# Patient Record
Sex: Male | Born: 1960 | Race: White | Hispanic: No | Smoking: Never smoker
Health system: Southern US, Community
[De-identification: ages and names within clinical notes are randomized; demographics above are authoritative.]

## PROBLEM LIST (undated history)

## (undated) DIAGNOSIS — K5790 Diverticulosis of intestine, part unspecified, without perforation or abscess without bleeding: Secondary | ICD-10-CM

## (undated) DIAGNOSIS — E785 Hyperlipidemia, unspecified: Secondary | ICD-10-CM

## (undated) DIAGNOSIS — F419 Anxiety disorder, unspecified: Secondary | ICD-10-CM

## (undated) DIAGNOSIS — I2510 Atherosclerotic heart disease of native coronary artery without angina pectoris: Secondary | ICD-10-CM

## (undated) DIAGNOSIS — I491 Atrial premature depolarization: Secondary | ICD-10-CM

## (undated) DIAGNOSIS — I10 Essential (primary) hypertension: Secondary | ICD-10-CM

## (undated) DIAGNOSIS — I241 Dressler's syndrome: Secondary | ICD-10-CM

## (undated) DIAGNOSIS — J45909 Unspecified asthma, uncomplicated: Secondary | ICD-10-CM

## (undated) DIAGNOSIS — I480 Paroxysmal atrial fibrillation: Secondary | ICD-10-CM

## (undated) DIAGNOSIS — I499 Cardiac arrhythmia, unspecified: Secondary | ICD-10-CM

## (undated) DIAGNOSIS — I219 Acute myocardial infarction, unspecified: Secondary | ICD-10-CM

## (undated) HISTORY — PX: COLONOSCOPY: SHX174

## (undated) HISTORY — PX: ESOPHAGOGASTRODUODENOSCOPY: SHX1529

---

## 2002-10-24 ENCOUNTER — Encounter: Payer: Self-pay | Admitting: Neurosurgery

## 2002-10-24 ENCOUNTER — Encounter: Admission: RE | Admit: 2002-10-24 | Discharge: 2002-10-24 | Payer: Self-pay | Admitting: Neurosurgery

## 2004-04-13 ENCOUNTER — Emergency Department (HOSPITAL_COMMUNITY): Admission: EM | Admit: 2004-04-13 | Discharge: 2004-04-13 | Payer: Self-pay | Admitting: Emergency Medicine

## 2004-11-30 ENCOUNTER — Ambulatory Visit: Payer: Self-pay | Admitting: Internal Medicine

## 2004-12-16 ENCOUNTER — Ambulatory Visit: Payer: Self-pay | Admitting: Internal Medicine

## 2005-03-02 ENCOUNTER — Ambulatory Visit: Payer: Self-pay | Admitting: Internal Medicine

## 2005-03-15 ENCOUNTER — Ambulatory Visit (HOSPITAL_COMMUNITY): Admission: RE | Admit: 2005-03-15 | Discharge: 2005-03-15 | Payer: Self-pay | Admitting: Family Medicine

## 2005-03-30 ENCOUNTER — Ambulatory Visit (HOSPITAL_COMMUNITY): Admission: RE | Admit: 2005-03-30 | Discharge: 2005-03-30 | Payer: Self-pay | Admitting: Family Medicine

## 2005-06-22 ENCOUNTER — Ambulatory Visit (HOSPITAL_COMMUNITY): Admission: RE | Admit: 2005-06-22 | Discharge: 2005-06-22 | Payer: Self-pay | Admitting: Family Medicine

## 2005-06-23 ENCOUNTER — Ambulatory Visit: Payer: Self-pay | Admitting: Internal Medicine

## 2005-06-23 ENCOUNTER — Inpatient Hospital Stay (HOSPITAL_COMMUNITY): Admission: AD | Admit: 2005-06-23 | Discharge: 2005-06-26 | Payer: Self-pay | Admitting: Family Medicine

## 2012-01-12 ENCOUNTER — Emergency Department (HOSPITAL_COMMUNITY)
Admission: EM | Admit: 2012-01-12 | Discharge: 2012-01-12 | Disposition: A | Discharge status: Home / Self Care | Payer: Worker's Compensation | Attending: Emergency Medicine | Admitting: Emergency Medicine

## 2012-01-12 ENCOUNTER — Encounter (HOSPITAL_COMMUNITY): Payer: Self-pay | Admitting: Emergency Medicine

## 2012-01-12 ENCOUNTER — Emergency Department (HOSPITAL_COMMUNITY): Payer: Worker's Compensation

## 2012-01-12 DIAGNOSIS — Y9289 Other specified places as the place of occurrence of the external cause: Secondary | ICD-10-CM | POA: Insufficient documentation

## 2012-01-12 DIAGNOSIS — J329 Chronic sinusitis, unspecified: Secondary | ICD-10-CM | POA: Insufficient documentation

## 2012-01-12 DIAGNOSIS — W1789XA Other fall from one level to another, initial encounter: Secondary | ICD-10-CM | POA: Insufficient documentation

## 2012-01-12 DIAGNOSIS — J3489 Other specified disorders of nose and nasal sinuses: Secondary | ICD-10-CM | POA: Insufficient documentation

## 2012-01-12 DIAGNOSIS — Z7982 Long term (current) use of aspirin: Secondary | ICD-10-CM | POA: Insufficient documentation

## 2012-01-12 DIAGNOSIS — Z87891 Personal history of nicotine dependence: Secondary | ICD-10-CM | POA: Insufficient documentation

## 2012-01-12 DIAGNOSIS — Z791 Long term (current) use of non-steroidal anti-inflammatories (NSAID): Secondary | ICD-10-CM | POA: Insufficient documentation

## 2012-01-12 DIAGNOSIS — M2390 Unspecified internal derangement of unspecified knee: Secondary | ICD-10-CM | POA: Insufficient documentation

## 2012-01-12 DIAGNOSIS — Y9301 Activity, walking, marching and hiking: Secondary | ICD-10-CM | POA: Insufficient documentation

## 2012-01-12 MED ORDER — OXYCODONE-ACETAMINOPHEN 5-325 MG PO TABS: 1.0000 | ORAL_TABLET | ORAL | Status: AC | PRN

## 2012-01-12 MED ORDER — AZITHROMYCIN 250 MG PO TABS: ORAL_TABLET | ORAL | Status: DC

## 2012-01-12 NOTE — ED Notes (Signed)
Pt slipped and fell and landed on r knee x 2 days ago. Swelling noted. Pt has ambulatory with cane.

## 2012-01-12 NOTE — ED Notes (Signed)
Pt is going to borrow crutches from friend

## 2012-01-12 NOTE — ED Provider Notes (Signed)
History     CSN: [id number]  Arrival date & time 01/12/12  1418   First MD Initiated Contact with Patient 01/12/12 1454      Chief Complaint  Patient presents with  . Knee Pain    (Consider location/radiation/quality/duration/timing/severity/associated sxs/prior treatment) HPI Comments: Patient c/o pain and swelling ot his right knee after a fall onto the knee 2 days prior. Fell directly on the knee while walking in a parking lot.  Pain is worse with movement and pt states he is unable to bear his full weight to the right leg due to pain and reports severe pain when his right leg is full extended or when his heel is resting on the bed or a flat surface.  He denies other injuries, hip or back pain, numbness or weakness of the extremity  Patient is a 51 y.o. male presenting with knee pain. The history is provided by the patient.  Knee Pain This is a new problem. The current episode started in the past 7 days. The problem occurs constantly. The problem has been unchanged. Associated symptoms include arthralgias, congestion and joint swelling. Pertinent negatives include no chills, fever, headaches, neck pain, numbness, rash, urinary symptoms, vertigo or weakness. The symptoms are aggravated by standing, twisting, walking and bending. He has tried ice for the symptoms. The treatment provided mild relief.    History reviewed. No pertinent past medical history.  History reviewed. No pertinent past surgical history.  History reviewed. No pertinent family history.  History  Substance Use Topics  . Smoking status: Former Games developer  . Smokeless tobacco: Not on file  . Alcohol Use: Yes     occ      Review of Systems  Constitutional: Negative for fever and chills.  HENT: Positive for congestion, rhinorrhea and sinus pressure. Negative for neck pain.   Genitourinary: Negative for dysuria and difficulty urinating.  Musculoskeletal: Positive for joint swelling, arthralgias and gait problem.    Skin: Negative for color change, rash and wound.  Neurological: Negative for vertigo, weakness, numbness and headaches.  All other systems reviewed and are negative.    Allergies  Review of patient's allergies indicates no known allergies.  Home Medications   Current Outpatient Rx  Name Route Sig Dispense Refill  . ASPIRIN 325 MG PO TABS Oral Take 325 mg by mouth every 7 (seven) days. Occasional dose taken    . NAPROXEN SODIUM 220 MG PO CAPS Oral Take 440 mg by mouth once as needed. For pain      BP 176/104  Pulse 93  Temp 98.3 F (36.8 C) (Oral)  Resp 17  SpO2 99%  Physical Exam  Nursing note and vitals reviewed. Constitutional: He is oriented to person, place, and time. He appears well-developed and well-nourished. No distress.  HENT:  Head: Normocephalic and atraumatic.  Nose: Mucosal edema present. Right sinus exhibits maxillary sinus tenderness. Left sinus exhibits maxillary sinus tenderness.  Neck: Normal range of motion. Neck supple.  Cardiovascular: Normal rate, regular rhythm, normal heart sounds and intact distal pulses.   Pulmonary/Chest: Effort normal and breath sounds normal. No respiratory distress.  Musculoskeletal: He exhibits edema and tenderness.       Right knee: He exhibits decreased range of motion, swelling and effusion. He exhibits no ecchymosis, no deformity, no laceration and no erythema. tenderness found. Medial joint line tenderness noted. No patellar tendon tenderness noted.       Legs:      ttp of the anteromedial right knee.  Moderate  effusion.  No erythema, bruising or step off deformity.  Dp pulse is brisk, distal sensation intact  Lymphadenopathy:    He has no cervical adenopathy.  Neurological: He is alert and oriented to person, place, and time. He exhibits normal muscle tone. Coordination normal.  Skin: Skin is warm and dry. No erythema.    ED Course  Procedures (including critical care time)  Labs Reviewed - No data to display Dg  Knee Complete 4 Views Right  01/12/2012  *RADIOLOGY REPORT*  Clinical Data: 51 year old male status post fall with swelling and pain.  RIGHT KNEE - COMPLETE 4+ VIEW  Comparison: None.  Findings: Moderate suprapatellar joint effusion.  Patella appears intact.  Relatively preserved joint spaces.  Mild medial and lateral compartment degenerative spurring.  No fracture or dislocation identified.  IMPRESSION: Moderate joint effusion.  No acute fracture or dislocation identified.   Original Report Authenticated By: Odessa Fleming III, M.D.      Knee immobilizer applied, pain improved, remains NV intact. Pt has crutches at home.   MDM     Consulted Dr. Romeo Apple,  Will see pt in his office on Monday 01/15/12 at 3:30 pm.  Likely ligament or meniscal injury.  Will also treat with abx for sinusitis  Rx: Percocet #24 zithromax   Davette Nugent L. [place], [place] 01/13/12 2228
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
## 2012-01-15 ENCOUNTER — Encounter: Payer: Self-pay | Admitting: Orthopedic Surgery

## 2012-01-15 ENCOUNTER — Ambulatory Visit (INDEPENDENT_AMBULATORY_CARE_PROVIDER_SITE_OTHER): Payer: Worker's Compensation | Admitting: Orthopedic Surgery

## 2012-01-15 VITALS — Ht 73.5 in | Wt 260.0 lb

## 2012-01-15 DIAGNOSIS — IMO0002 Reserved for concepts with insufficient information to code with codable children: Secondary | ICD-10-CM

## 2012-01-15 NOTE — Patient Instructions (Addendum)
Apply ice for 30 minutes every 2 hours while awake   Wear brace  Use crutches as needed   No work until MRI reviewed by doctor   You have been scheduled for an MRI scan.  Your insurance company requires a precertification prior to scheduling the MRI.  If the MRI scan is not approved we will let you know and make further treatment recommendations according to your insurance's guidelines.  * We will schedule you for another  appointment to review the results and make further treatment recommendations

## 2012-01-15 NOTE — Progress Notes (Signed)
Subjective:    Patient ID: Chad Ellis, male    DOB: Dec 28, 1960, 51 y.o.   MRN: [id number]  HPI Comments: Timeline: 01/10/2012 this patient fell in [facility] parking lot slipping on a wet ground falling onto a flexed knee experiencing excruciating pain at that time. 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  On 01/12/2012 he went to the [facility] emergency room for evaluation of continued knee pain. His x-ray showed joint effusion no fracture he was placed in a long-leg brace given crutches and Percocet for pain he presents now for evaluation.  Knee Pain  The incident occurred 5 to 7 days ago. Incident location: at hotel. The pain is present in the right knee. The quality of the pain is described as burning and aching. The pain is at a severity of 9/10. The pain has been constant since onset. Associated symptoms include an inability to bear weight and a loss of motion. Pertinent negatives include no loss of sensation, muscle weakness, numbness or tingling. Associated symptoms comments: Catching .      Review of Systems  Neurological: Negative for tingling and numbness.   the patient listed positive review of systems as fatigue, heartburn, joint pain and muscle pain  He listed all the other systems as normal  His medical history is negative he listed no surgeries. His primary care physician is Dr. Clelia Croft [facility]  His pharmacy is [facility] and  He denied taking any chronic medications he says he has a negative family history  He is married he finished a dull high school he does not smoke his alcohol use is occasional. He denied any street drug use       Objective:   Physical Exam  Nursing note and vitals reviewed. Constitutional: He is oriented to person, place, and time. He appears well-developed and well-nourished. No distress.  HENT:  Head: Normocephalic.  Eyes: Pupils are equal, round, and  reactive to light.  Neck: Normal range of motion.  Cardiovascular: Intact distal pulses.   Musculoskeletal:       Right hip: Normal.       Left hip: Normal.       Right knee: He exhibits decreased range of motion, swelling, effusion, bony tenderness and abnormal meniscus. He exhibits no laceration, normal alignment, no LCL laxity, normal patellar mobility and no MCL laxity. tenderness found. Medial joint line tenderness noted. No lateral joint line, no MCL, no LCL and no patellar tendon tenderness noted.       Right lower leg: He exhibits tenderness, bony tenderness, swelling and edema. He exhibits no deformity and no laceration.       Positive Murray sign for medial meniscal tear  Neurological: He is alert and oriented to person, place, and time. He has normal reflexes. He displays normal reflexes. He exhibits normal muscle tone. Coordination normal.  Skin: Skin is warm and dry. No rash noted. He is not diaphoretic. No erythema. No pallor.  Psychiatric: He has a normal mood and affect. His behavior is normal. Judgment and thought content normal.          Assessment & Plan:  X-ray was reviewed. Effusion noted no fracture  Clinical exam is consistent with a torn medial meniscus.  Recommend MRI to assess the joint, surgery if meniscus torn  The patient is placed in a wrap style economy hinged brace full range of motion weight bear as tolerated with crutches, wean crutches  as tolerated  Continue ice  No work until MRI is completed and assessed by physician

## 2012-01-20 NOTE — ED Provider Notes (Signed)
Medical screening examination/treatment/procedure(s) were performed by non-physician practitioner and as supervising physician I was immediately available for consultation/collaboration. Devoria Albe, MD, FACEP   Ward Givens, MD 01/20/12 0900

## 2012-01-23 ENCOUNTER — Telehealth: Payer: Self-pay | Admitting: Orthopedic Surgery

## 2012-01-23 NOTE — Telephone Encounter (Signed)
Per previous note, MRI appointment is scheduled, at [facility], for tomorrow, 01/24/12, 1:00p.m, 12:45 p.m registration; patient is aware, and has confirmed.  Follow up appointment for results has also been scheduled.

## 2012-01-23 NOTE — Telephone Encounter (Signed)
I confirmed with our office that patient is scheduled for the MRI at [facility] for tomorrow, 01/24/12, 1:00, 12:45 registration, (see separate note).  I confirmed with patient.  He is to follow up here for results, appointment scheduled.

## 2012-01-23 NOTE — Telephone Encounter (Signed)
At time of check-out, office visit 01/15/12, I spoke with patient regarding MRI and any insurance available.  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  Patient aware, per our conversation, that this is 3rd party, if it is determined that this party is responsible, and he is therefore considered self-pay; for this reason, patient is holding on the MRI.  * I called back today to follow up with patient. He relayed information about his contact with employer, which he said he is awaiting further word from, which he assumes will help him to know how to proceed. He also said he received a call from [facility]'s/[place]'s pre-authorization center, Rodman Pickle, about scheduling MRI.   I received a call from Bellamy at this center (ph [phone]).  I relayed the information as per office visit, that MRI was on hold for further information / insurance / or self-pay status from patient.

## 2012-01-24 ENCOUNTER — Ambulatory Visit (HOSPITAL_COMMUNITY)
Admission: RE | Admit: 2012-01-24 | Discharge: 2012-01-24 | Disposition: A | Discharge status: Home / Self Care | Payer: Worker's Compensation | Source: Ambulatory Visit | Attending: Orthopedic Surgery | Admitting: Orthopedic Surgery

## 2012-01-24 DIAGNOSIS — S83509A Sprain of unspecified cruciate ligament of unspecified knee, initial encounter: Secondary | ICD-10-CM | POA: Insufficient documentation

## 2012-01-24 DIAGNOSIS — M25569 Pain in unspecified knee: Secondary | ICD-10-CM | POA: Insufficient documentation

## 2012-01-24 DIAGNOSIS — S82209A Unspecified fracture of shaft of unspecified tibia, initial encounter for closed fracture: Secondary | ICD-10-CM | POA: Insufficient documentation

## 2012-01-24 DIAGNOSIS — W19XXXA Unspecified fall, initial encounter: Secondary | ICD-10-CM | POA: Insufficient documentation

## 2012-01-24 DIAGNOSIS — IMO0002 Reserved for concepts with insufficient information to code with codable children: Secondary | ICD-10-CM

## 2012-01-29 ENCOUNTER — Encounter: Payer: Self-pay | Admitting: Orthopedic Surgery

## 2012-01-29 ENCOUNTER — Ambulatory Visit (INDEPENDENT_AMBULATORY_CARE_PROVIDER_SITE_OTHER): Payer: Worker's Compensation | Admitting: Orthopedic Surgery

## 2012-01-29 VITALS — Ht 73.5 in | Wt 260.0 lb

## 2012-01-29 DIAGNOSIS — IMO0002 Reserved for concepts with insufficient information to code with codable children: Secondary | ICD-10-CM

## 2012-01-29 DIAGNOSIS — S99919A Unspecified injury of unspecified ankle, initial encounter: Secondary | ICD-10-CM

## 2012-01-29 DIAGNOSIS — M238X9 Other internal derangements of unspecified knee: Secondary | ICD-10-CM

## 2012-01-29 DIAGNOSIS — M2390 Unspecified internal derangement of unspecified knee: Secondary | ICD-10-CM

## 2012-01-29 DIAGNOSIS — S8990XA Unspecified injury of unspecified lower leg, initial encounter: Secondary | ICD-10-CM

## 2012-01-29 MED ORDER — HYDROCODONE-ACETAMINOPHEN 10-325 MG PO TABS: 1.0000 | ORAL_TABLET | Freq: Four times a day (QID) | ORAL | Status: DC | PRN
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
## 2012-01-29 NOTE — Patient Instructions (Addendum)
Injuries noted on MRI, including a posterior cruciate ligament fracture avulsion and a torn meniscus possibly a torn anterior cruciate ligament  You  will need an exam under anesthesia to determine the stability of her knee followed by an arthroscopic evaluation and treatment of the meniscal tear. You  may need to procedures depending on the results of the 1st procedure  We will call the  insurer and get approval from the insurance company

## 2012-03-27 ENCOUNTER — Telehealth: Payer: Self-pay | Admitting: Orthopedic Surgery

## 2012-03-27 NOTE — Telephone Encounter (Signed)
Call received from patient. She called to request some refills.   Dose confirmed and script sent.

## 2012-03-27 NOTE — Telephone Encounter (Signed)
Patient called today to relay information.  He is continuing out of work status, per last visit here 01/29/12.  He has not been back in touch about scheduling surgery, as he is continuing to pursue responsible party/3rd party insurance information.        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 Patient aware that we made attempts without any calls back, however, also aware that he remains self-pay, and aware that he has had the option to schedule further treatment as self-pay.  He has since made further contact with his employer and after various communication, was referred to employment's workers compensation insurer,  [employer], ph [phone], and has adjustor as contact, Marca Ancona, ext [phone] / fax [phone].  CL# issued [phone].   Patient has been given office workers comp form packet, aware to complete and return, and employers portion has been faxed to this insurer.  Dr Romeo Apple to review - advise if any other recommendations while this information in process.   Patient ph# [phone].

## 2012-03-28 NOTE — Telephone Encounter (Signed)
No advice at present

## 2012-04-11 ENCOUNTER — Telehealth: Payer: Self-pay | Admitting: Orthopedic Surgery
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
## 2012-04-12 NOTE — Telephone Encounter (Addendum)
Medical records faxed to the Workers Comp insurer with authorization release form as noted.  Patient states that Workers Comp will likely send him for a second opinion.    * Patient also asked about possible refill on pain medication from last visit due to pending status of Workers comp review.  I gave him the instructions of requesting that his pharmacy fax to the office, although Dr. Romeo Apple would need to review it.

## 2012-04-19 NOTE — Telephone Encounter (Signed)
04/18/12 Received call back from Dustin Folks.  Re-Faxed copy of records to her w/work status note.  She states now received and will review in order for patient to be contacted as soon as possible.

## 2012-04-22 NOTE — Telephone Encounter (Signed)
RECEIVED CALL BACK FROM Workers Designer, industrial/product at [employer], Maralyn Sago, [place] [phone],Y [phone]. Dr. Romeo Apple - Please advise if patient needs to be seen again before surgery date can be scheduled - Workers Comp states "ready to move forward for surgery" for this patient, per review of his medical records.

## 2012-04-23 NOTE — Telephone Encounter (Signed)
Yes   i dont even know who this is

## 2012-04-24 NOTE — Telephone Encounter (Signed)
04/23/12 Called back to patient.  Appointment scheduled, office visit, pre-op, discuss surgery.  Notified Workers comp Scientist, forensic of appointment, 05/02/12, 10:30a.m.

## 2012-05-02 ENCOUNTER — Ambulatory Visit (INDEPENDENT_AMBULATORY_CARE_PROVIDER_SITE_OTHER): Payer: Worker's Compensation

## 2012-05-02 ENCOUNTER — Ambulatory Visit (INDEPENDENT_AMBULATORY_CARE_PROVIDER_SITE_OTHER): Payer: Worker's Compensation | Admitting: Orthopedic Surgery

## 2012-05-02 ENCOUNTER — Telehealth: Payer: Self-pay | Admitting: Orthopedic Surgery

## 2012-05-02 VITALS — BP 150/98 | Ht 73.5 in | Wt 260.0 lb

## 2012-05-02 DIAGNOSIS — S82201A Unspecified fracture of shaft of right tibia, initial encounter for closed fracture: Secondary | ICD-10-CM

## 2012-05-02 DIAGNOSIS — S82209A Unspecified fracture of shaft of unspecified tibia, initial encounter for closed fracture: Secondary | ICD-10-CM

## 2012-05-02 MED ORDER — HYDROCODONE-ACETAMINOPHEN 5-325 MG PO TABS: 1.0000 | ORAL_TABLET | ORAL | Status: DC | PRN

## 2012-05-02 NOTE — Patient Instructions (Addendum)
Schedule for right knee arthroscopy + medial menisectomy: exam under anesthesia (Workers Comp)  We have to get W/C to approve the surgery    [[id number], [id number]]

## 2012-05-02 NOTE — Telephone Encounter (Signed)
Faxed note of request for written approval,authorization for surgery to attention of Dustin Folks, adjustor, [employer], Workers Comp, with procedure code and diagnosis code information. Fax # [phone], ph # [phone].

## 2012-05-02 NOTE — Progress Notes (Signed)
Patient ID: Chad Ellis, male   DOB: 1960-07-15, 52 y.o.   MRN: [id number] Chief Complaint  Patient presents with  . Follow-up    recheck right knee and discuss surgery    Timeline: 01/10/2012 this patient fell in [facility] parking lot slipping on a wet ground falling onto a flexed knee experiencing excruciating pain at that time. 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  On 01/12/2012 he went to the [facility] emergency room for evaluation of continued knee pain. His x-ray showed joint effusion no fracture he was placed in a long-leg brace given crutches and Percocet for pain he presents now for evaluation.  Knee Pain   The incident occurred 5 to 7 days ago. Incident location: at hotel. The pain is present in the right knee. The quality of the pain is described as burning and aching. The pain is at a severity of 9/10. The pain has been constant since onset. Associated symptoms include an inability to bear weight and a loss of motion. Pertinent negatives include no loss of sensation, muscle weakness, numbness or tingling. Associated symptoms comments: Catching .   Review of systems the patient does become anxious and is worried because of his financial situation which has become very tenuous  It appears the patient is finally gotten workers compensation issue straightened out he took an x-ray of his tibia today shows his tibia fracture has healed.  Clinical exam shows he has stability in the collateral ligaments as well as the anterior cruciate ligament has a grade 1 posterior drawer. He still has painful range of motion medial joint line tenderness  He will need to have arthroscopy of the right knee medial meniscectomy arthroscopic evaluation of the anterior cruciate ligament and PCL and exam under anesthesia to fully evaluate his knee however it appears that his PCL injury and anterior cruciate ligament injuries have  healed  He will  benefit from medial meniscectomy in physical therapy to regain function of his knee  Once he gets the number for [place] to go head with surgery we'll schedule him for the procedure

## 2012-05-02 NOTE — Telephone Encounter (Signed)
Called Dustin Folks to follow up on fax; states received.  Reviewing office note and to forward written notice of approval - states should be by tomorrow, 05/03/12.

## 2012-05-06 NOTE — Telephone Encounter (Signed)
I called back to follow up with Workers Comp today, Mon, 05/06/12; adjusstor does have our notes; states reviewing notes and to then fax back response regarding surgery.

## 2012-05-07 ENCOUNTER — Encounter (HOSPITAL_COMMUNITY): Payer: Self-pay | Admitting: Pharmacy Technician

## 2012-05-07 ENCOUNTER — Other Ambulatory Visit: Payer: Self-pay | Admitting: *Deleted

## 2012-05-08 ENCOUNTER — Encounter (HOSPITAL_COMMUNITY)
Admission: RE | Admit: 2012-05-08 | Discharge: 2012-05-08 | Disposition: A | Discharge status: Home / Self Care | Payer: Worker's Compensation | Source: Ambulatory Visit | Attending: Orthopedic Surgery | Admitting: Orthopedic Surgery

## 2012-05-08 ENCOUNTER — Encounter (HOSPITAL_COMMUNITY): Payer: Self-pay

## 2012-05-08 ENCOUNTER — Other Ambulatory Visit: Payer: Self-pay

## 2012-05-08 HISTORY — DX: Anxiety disorder, unspecified: F41.9

## 2012-05-08 HISTORY — DX: Diverticulosis of intestine, part unspecified, without perforation or abscess without bleeding: K57.90

## 2012-05-08 LAB — HEMOGLOBIN AND HEMATOCRIT, BLOOD: Hemoglobin: 14.9 g/dL (ref 13.0–17.0)

## 2012-05-08 LAB — SURGICAL PCR SCREEN: MRSA, PCR: NEGATIVE

## 2012-05-08 MED ORDER — CHLORHEXIDINE GLUCONATE 4 % EX LIQD: 60.0000 mL | Freq: Once | CUTANEOUS | Status: DC

## 2012-05-08 NOTE — Patient Instructions (Addendum)
Chad Ellis  05/08/2012   Your procedure is scheduled on:  05/10/12  Report to [facility] at 06:15 AM.  Call this number if you have problems the morning of surgery: [phone]   Remember:   Do not eat food or drink liquids after midnight.   Take these medicines the morning of surgery with A SIP OF WATER: Hydrocodone if needed.   Do not wear jewelry, make-up or nail polish.  Do not wear lotions, powders, or perfumes.   Do not shave 48 hours prior to surgery. Men may shave face and neck.  Do not bring valuables to the hospital.  Contacts, dentures or bridgework may not be worn into surgery.  Leave suitcase in the car. After surgery it may be brought to your room.  For patients admitted to the hospital, checkout time is 11:00 AM the day of  discharge.   Patients discharged the day of surgery will not be allowed to drive  home.  Name and phone number of your driver:   Special Instructions: Shower using CHG 2 nights before surgery and the night before surgery.  If you shower the day of surgery use CHG.  Use special wash - you have one bottle of CHG for all showers.  You should use approximately 1/3 of the bottle for each shower.   Please read over the following fact sheets that you were given: Pain Booklet, MRSA Information, Surgical Site Infection Prevention, Anesthesia Post-op Instructions and Care and Recovery After Surgery    Arthroscopic Procedure, Knee An arthroscopic procedure can find what is wrong with your knee. PROCEDURE Arthroscopy is a surgical technique that allows your orthopedic surgeon to diagnose and treat your knee injury with accuracy. They will look into your knee through a small instrument. This is almost like a small (pencil sized) telescope. Because arthroscopy affects your knee less than open knee surgery, you can anticipate a more rapid recovery. Taking an active role by following your caregiver's instructions will help with rapid and complete recovery. Use  crutches, rest, elevation, ice, and knee exercises as instructed. The length of recovery depends on various factors including type of injury, age, physical condition, medical conditions, and your rehabilitation. Your knee is the joint between the large bones (femur and tibia) in your leg. Cartilage covers these bone ends which are smooth and slippery and allow your knee to bend and move smoothly. Two menisci, thick, semi-lunar shaped pads of cartilage which form a rim inside the joint, help absorb shock and stabilize your knee. Ligaments bind the bones together and support your knee joint. Muscles move the joint, help support your knee, and take stress off the joint itself. Because of this all programs and physical therapy to rehabilitate an injured or repaired knee require rebuilding and strengthening your muscles. AFTER THE PROCEDURE  After the procedure, you will be moved to a recovery area until most of the effects of the medication have worn off. Your caregiver will discuss the test results with you.  Only take over-the-counter or prescription medicines for pain, discomfort, or fever as directed by your caregiver. SEEK MEDICAL CARE IF:   You have increased bleeding from your wounds.  You see redness, swelling, or have increasing pain in your wounds.  You have pus coming from your wound.  You have an oral temperature above 102 F (38.9 C).  You notice a bad smell coming from the wound or dressing.  You have severe pain with any motion of your knee. SEEK IMMEDIATE  MEDICAL CARE IF:   You develop a rash.  You have difficulty breathing.  You have any allergic problems. Document Released: 02/25/2000 Document Revised: 05/22/2011 Document Reviewed: 09/18/2007 [facility] Patient Information 2013 [place], [place].    PATIENT INSTRUCTIONS POST-ANESTHESIA  IMMEDIATELY FOLLOWING SURGERY:  Do not drive or operate machinery for the first twenty four hours after surgery.  Do not make any  important decisions for twenty four hours after surgery or while taking narcotic pain medications or sedatives.  If you develop intractable nausea and vomiting or a severe headache please notify your doctor immediately.  FOLLOW-UP:  Please make an appointment with your surgeon as instructed. You do not need to follow up with anesthesia unless specifically instructed to do so.  WOUND CARE INSTRUCTIONS (if applicable):  Keep a dry clean dressing on the anesthesia/puncture wound site if there is drainage.  Once the wound has quit draining you may leave it open to air.  Generally you should leave the bandage intact for twenty four hours unless there is drainage.  If the epidural site drains for more than 36-48 hours please call the anesthesia department.  QUESTIONS?:  Please feel free to call your physician or the hospital operator if you have any questions, and they will be happy to assist you.

## 2012-05-08 NOTE — Telephone Encounter (Signed)
05/07/12*Per Dr. Romeo Apple:  Surgery has been scheduled for Friday 05/10/12, at [facility], per [employer] written authorization received from [employer].  Patient aware of surgery and of post operative appointment.

## 2012-05-09 NOTE — H&P (Signed)
Chad Ellis is an 52 y.o. male.   Subjective:   Patient ID: Chad Ellis, male DOB: 1960/04/04, 52 y.o. MRN: [id number]  HPI Comments: Timeline: 01/10/2012 this patient fell in [facility] parking lot slipping on a wet ground falling onto a flexed knee experiencing excruciating pain at that time. 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  On 01/12/2012 he went to the [facility] emergency room for evaluation of continued knee pain. His x-ray showed joint effusion no fracture he was placed in a long-leg brace given crutches and Percocet for pain he presents now for evaluation.  Knee Pain  The incident occurred 5 to 7 days ago. Incident location: at hotel. The pain is present in the right knee. The quality of the pain is described as burning and aching. The pain is at a severity of 9/10. The pain has been constant since onset. Associated symptoms include an inability to bear weight and a loss of motion. Pertinent negatives include no loss of sensation, muscle weakness, numbness or tingling. Associated symptoms comments: Catching .   Review of Systems  Neurological: Negative for tingling and numbness.  the patient listed positive review of systems as fatigue, heartburn, joint pain and muscle pain  He listed all the other systems as normal  His medical history is negative he listed no surgeries. His primary care physician is Dr. Clelia Croft the [facility]  His pharmacy is [facility]  He denied taking any chronic medications he says he has a negative family history  He is married he finished a dull high school he does not smoke his alcohol use is occasional. He denied any street drug use   Past Medical History  Diagnosis Date  . Anxiety   . Diverticulosis     Past Surgical History  Procedure Laterality Date  . Esophagogastroduodenoscopy    . Colonoscopy      Family History  Problem Relation Age of Onset  . Atrial  fibrillation Mother   . COPD Father   . Mesothelioma Father   . Diabetes Father   . Cholecystitis Sister    Social History:  reports that he has never smoked. He does not have any smokeless tobacco history on file. He reports that  drinks alcohol. He reports that he does not use illicit drugs.  Allergies: No Known Allergies  No prescriptions prior to admission    Results for orders placed during the hospital encounter of 05/08/12 (from the past 48 hour(s))  SURGICAL PCR SCREEN     Status: None   Collection Time    05/08/12  9:02 AM      Result Value Range   MRSA, PCR NEGATIVE  NEGATIVE   Staphylococcus aureus NEGATIVE  NEGATIVE   Comment:            The Xpert SA Assay (FDA     approved for NASAL specimens     in patients over 14 years of age),     is one component of     a comprehensive surveillance     program.  Test performance has     been validated by [employer] for patients greater     than or equal to 64 year old.     It is not intended     to diagnose infection nor to     guide or monitor treatment.  HEMOGLOBIN AND HEMATOCRIT, BLOOD     Status:  None   Collection Time    05/08/12  9:30 AM      Result Value Range   Hemoglobin 14.9  13.0 - 17.0 g/dL   HCT 16.1  09.6 - 04.5 %   No results found.  ROS  There were no vitals taken for this visit. Physical Exam   General appearance is normal, the patient is alert and oriented x3 with normal mood and affect.  Ambulation: he limps on the right leg   Upper extremity exam  The right and left upper extremity:   Inspection and palpation revealed no abnormalities in the upper extremities.   Range of motion is full without contracture.  Motor exam is normal with grade 5 strength.  The joints are fully reduced without subluxation.  There is no atrophy or tremor and muscle tone is normal.  All joints are stable.   Left Lower extremity exam Inspection and palpation revealed no tenderness or abnormality in  alignment in the lower extremities. Range of motion is full.  Strength is grade 5. All joints are stable.  Right lower extremity:   Medial joint line tender Joint effusionis small ROM is limed by pain and is 120 degrees Ligaments PCL grade 1  acl grade ? (wait for EUA) Collaterals stable including dial test  Motor exam 5/5  Sign of McMurray +    Assessment/Plan 1. Medial meniscus is torn 2. pcl avulsion has healed  3. ACL laxity ? Severity   Plan: EUA then sark medial menisectomy    Fuller Canada 05/09/2012, 1:18 PM

## 2012-05-10 ENCOUNTER — Encounter (HOSPITAL_COMMUNITY): Payer: Self-pay | Admitting: *Deleted

## 2012-05-10 ENCOUNTER — Ambulatory Visit (HOSPITAL_COMMUNITY): Payer: Worker's Compensation | Admitting: Anesthesiology

## 2012-05-10 ENCOUNTER — Ambulatory Visit (HOSPITAL_COMMUNITY)
Admission: RE | Admit: 2012-05-10 | Discharge: 2012-05-10 | Disposition: A | Discharge status: Home / Self Care | Payer: Worker's Compensation | Source: Ambulatory Visit | Attending: Orthopedic Surgery | Admitting: Orthopedic Surgery

## 2012-05-10 ENCOUNTER — Encounter (HOSPITAL_COMMUNITY)
Admission: RE | Disposition: A | Discharge status: Home / Self Care | Payer: Self-pay | Source: Ambulatory Visit | Attending: Orthopedic Surgery

## 2012-05-10 ENCOUNTER — Encounter (HOSPITAL_COMMUNITY): Payer: Self-pay | Admitting: Anesthesiology

## 2012-05-10 DIAGNOSIS — I10 Essential (primary) hypertension: Secondary | ICD-10-CM | POA: Insufficient documentation

## 2012-05-10 DIAGNOSIS — M238X1 Other internal derangements of right knee: Secondary | ICD-10-CM

## 2012-05-10 DIAGNOSIS — Y9289 Other specified places as the place of occurrence of the external cause: Secondary | ICD-10-CM | POA: Insufficient documentation

## 2012-05-10 DIAGNOSIS — S8991XD Unspecified injury of right lower leg, subsequent encounter: Secondary | ICD-10-CM

## 2012-05-10 DIAGNOSIS — Z01810 Encounter for preprocedural cardiovascular examination: Secondary | ICD-10-CM | POA: Insufficient documentation

## 2012-05-10 DIAGNOSIS — M6750 Plica syndrome, unspecified knee: Secondary | ICD-10-CM

## 2012-05-10 DIAGNOSIS — M2240 Chondromalacia patellae, unspecified knee: Secondary | ICD-10-CM | POA: Insufficient documentation

## 2012-05-10 DIAGNOSIS — Z01812 Encounter for preprocedural laboratory examination: Secondary | ICD-10-CM | POA: Insufficient documentation

## 2012-05-10 DIAGNOSIS — W010XXA Fall on same level from slipping, tripping and stumbling without subsequent striking against object, initial encounter: Secondary | ICD-10-CM | POA: Insufficient documentation

## 2012-05-10 DIAGNOSIS — IMO0002 Reserved for concepts with insufficient information to code with codable children: Secondary | ICD-10-CM | POA: Insufficient documentation

## 2012-05-10 DIAGNOSIS — M2241 Chondromalacia patellae, right knee: Secondary | ICD-10-CM | POA: Diagnosis present

## 2012-05-10 DIAGNOSIS — M6751 Plica syndrome, right knee: Secondary | ICD-10-CM | POA: Diagnosis present

## 2012-05-10 HISTORY — PX: CHONDROPLASTY: SHX5177

## 2012-05-10 HISTORY — PX: KNEE ARTHROSCOPY WITH MEDIAL MENISECTOMY: SHX5651

## 2012-05-10 HISTORY — PX: KNEE ARTHROSCOPY WITH EXCISION PLICA: SHX5647

## 2012-05-10 HISTORY — PX: EXAM UNDER ANESTHESIA WITH MANIPULATION OF KNEE: SHX5816

## 2012-05-10 SURGERY — ARTHROSCOPY, KNEE, WITH MEDIAL MENISCECTOMY
Anesthesia: General | Site: Knee | Laterality: Right | Wound class: Clean

## 2012-05-10 MED ORDER — ONDANSETRON HCL 4 MG/2ML IJ SOLN
INTRAMUSCULAR | Status: AC
  Filled 2012-05-10: qty 2

## 2012-05-10 MED ORDER — SODIUM CHLORIDE 0.9 % IR SOLN
Status: DC | PRN
  Administered 2012-05-10 (×4)

## 2012-05-10 MED ORDER — FENTANYL CITRATE 0.05 MG/ML IJ SOLN
INTRAMUSCULAR | Status: DC | PRN
  Administered 2012-05-10: 25 ug via INTRAVENOUS
  Administered 2012-05-10 (×2): 50 ug via INTRAVENOUS
  Administered 2012-05-10: 25 ug via INTRAVENOUS
  Administered 2012-05-10: 50 ug via INTRAVENOUS

## 2012-05-10 MED ORDER — FENTANYL CITRATE 0.05 MG/ML IJ SOLN
INTRAMUSCULAR | Status: AC
  Filled 2012-05-10: qty 2

## 2012-05-10 MED ORDER — PROMETHAZINE HCL 12.5 MG PO TABS: 12.5000 mg | ORAL_TABLET | Freq: Four times a day (QID) | ORAL | Status: DC | PRN

## 2012-05-10 MED ORDER — BUPIVACAINE-EPINEPHRINE PF 0.5-1:200000 % IJ SOLN
INTRAMUSCULAR | Status: AC
  Filled 2012-05-10: qty 20

## 2012-05-10 MED ORDER — MIDAZOLAM HCL 2 MG/2ML IJ SOLN
INTRAMUSCULAR | Status: AC
  Filled 2012-05-10: qty 2

## 2012-05-10 MED ORDER — SODIUM CHLORIDE 0.9 % IR SOLN
Status: DC | PRN
  Administered 2012-05-10: 1000 mL

## 2012-05-10 MED ORDER — CEFAZOLIN SODIUM-DEXTROSE 2-3 GM-% IV SOLR
INTRAVENOUS | Status: AC
  Filled 2012-05-10: qty 50

## 2012-05-10 MED ORDER — LIDOCAINE HCL (PF) 1 % IJ SOLN
INTRAMUSCULAR | Status: AC
  Filled 2012-05-10: qty 5

## 2012-05-10 MED ORDER — PROPOFOL 10 MG/ML IV BOLUS
INTRAVENOUS | Status: DC | PRN
  Administered 2012-05-10: 200 mg via INTRAVENOUS

## 2012-05-10 MED ORDER — LABETALOL HCL 5 MG/ML IV SOLN
10.0000 mg | INTRAVENOUS | Status: DC | PRN
  Administered 2012-05-10: 20 mg via INTRAVENOUS

## 2012-05-10 MED ORDER — MIDAZOLAM HCL 2 MG/2ML IJ SOLN
1.0000 mg | INTRAMUSCULAR | Status: AC | PRN
  Administered 2012-05-10 (×3): 2 mg via INTRAVENOUS

## 2012-05-10 MED ORDER — CEFAZOLIN SODIUM-DEXTROSE 2-3 GM-% IV SOLR: 2.0000 g | INTRAVENOUS | Status: DC

## 2012-05-10 MED ORDER — FENTANYL CITRATE 0.05 MG/ML IJ SOLN
25.0000 ug | INTRAMUSCULAR | Status: DC | PRN
  Administered 2012-05-10 (×3): 50 ug via INTRAVENOUS

## 2012-05-10 MED ORDER — CELECOXIB 100 MG PO CAPS
400.0000 mg | ORAL_CAPSULE | Freq: Once | ORAL | Status: AC
  Administered 2012-05-10: 400 mg via ORAL

## 2012-05-10 MED ORDER — MIDAZOLAM HCL 5 MG/5ML IJ SOLN
INTRAMUSCULAR | Status: AC
  Filled 2012-05-10: qty 5

## 2012-05-10 MED ORDER — OXYCODONE-ACETAMINOPHEN 5-325 MG PO TABS: 1.0000 | ORAL_TABLET | ORAL | Status: DC | PRN

## 2012-05-10 MED ORDER — ACETAMINOPHEN 10 MG/ML IV SOLN
1000.0000 mg | Freq: Once | INTRAVENOUS | Status: AC
  Administered 2012-05-10: 1000 mg via INTRAVENOUS

## 2012-05-10 MED ORDER — MIDAZOLAM HCL 5 MG/5ML IJ SOLN
INTRAMUSCULAR | Status: DC | PRN
  Administered 2012-05-10: 2 mg via INTRAVENOUS

## 2012-05-10 MED ORDER — CEFAZOLIN SODIUM-DEXTROSE 2-3 GM-% IV SOLR
INTRAVENOUS | Status: DC | PRN
  Administered 2012-05-10: 2 g via INTRAVENOUS

## 2012-05-10 MED ORDER — LABETALOL HCL 5 MG/ML IV SOLN
INTRAVENOUS | Status: DC | PRN
  Administered 2012-05-10: 5 mg via INTRAVENOUS

## 2012-05-10 MED ORDER — PROPOFOL 10 MG/ML IV EMUL
INTRAVENOUS | Status: AC
  Filled 2012-05-10: qty 20

## 2012-05-10 MED ORDER — EPINEPHRINE HCL 1 MG/ML IJ SOLN
INTRAMUSCULAR | Status: AC
  Filled 2012-05-10: qty 5

## 2012-05-10 MED ORDER — ONDANSETRON HCL 4 MG/2ML IJ SOLN: 4.0000 mg | Freq: Once | INTRAMUSCULAR | Status: DC | PRN

## 2012-05-10 MED ORDER — BUPIVACAINE-EPINEPHRINE PF 0.5-1:200000 % IJ SOLN
INTRAMUSCULAR | Status: DC | PRN
  Administered 2012-05-10: 60 mL

## 2012-05-10 MED ORDER — CELECOXIB 100 MG PO CAPS
ORAL_CAPSULE | ORAL | Status: AC
  Filled 2012-05-10: qty 4

## 2012-05-10 MED ORDER — OXYCODONE HCL 5 MG PO TABS
5.0000 mg | ORAL_TABLET | Freq: Once | ORAL | Status: AC
  Administered 2012-05-10: 5 mg via ORAL

## 2012-05-10 MED ORDER — OXYCODONE HCL 5 MG PO TABS
ORAL_TABLET | ORAL | Status: AC
  Filled 2012-05-10: qty 1

## 2012-05-10 MED ORDER — LACTATED RINGERS IV SOLN
INTRAVENOUS | Status: DC
  Administered 2012-05-10: 07:00:00 via INTRAVENOUS

## 2012-05-10 MED ORDER — KETOROLAC TROMETHAMINE 30 MG/ML IJ SOLN
30.0000 mg | Freq: Once | INTRAMUSCULAR | Status: AC
  Administered 2012-05-10: 30 mg via INTRAVENOUS

## 2012-05-10 MED ORDER — IBUPROFEN 800 MG PO TABS: 800.0000 mg | ORAL_TABLET | Freq: Three times a day (TID) | ORAL | Status: DC | PRN

## 2012-05-10 MED ORDER — ONDANSETRON HCL 4 MG/2ML IJ SOLN
4.0000 mg | Freq: Once | INTRAMUSCULAR | Status: AC
  Administered 2012-05-10: 4 mg via INTRAVENOUS

## 2012-05-10 MED ORDER — ACETAMINOPHEN 10 MG/ML IV SOLN
INTRAVENOUS | Status: AC
  Filled 2012-05-10: qty 100

## 2012-05-10 MED ORDER — KETOROLAC TROMETHAMINE 30 MG/ML IJ SOLN
INTRAMUSCULAR | Status: AC
  Filled 2012-05-10: qty 1

## 2012-05-10 MED ORDER — LABETALOL HCL 5 MG/ML IV SOLN
INTRAVENOUS | Status: AC
  Filled 2012-05-10: qty 4

## 2012-05-10 SURGICAL SUPPLY — 53 items
ARTHROWAND PARAGON T2 (SURGICAL WAND)
BAG HAMPER (MISCELLANEOUS) ×2 IMPLANT
BANDAGE ELASTIC 6 VELCRO NS (GAUZE/BANDAGES/DRESSINGS) ×2 IMPLANT
BLADE AGGRESSIVE PLUS 4.0 (BLADE) ×2 IMPLANT
BLADE SURG SZ11 CARB STEEL (BLADE) ×2 IMPLANT
CHLORAPREP W/TINT 26ML (MISCELLANEOUS) ×4 IMPLANT
CLOTH BEACON ORANGE TIMEOUT ST (SAFETY) ×2 IMPLANT
COOLER CRYO IC GRAV AND TUBE (ORTHOPEDIC SUPPLIES) ×2 IMPLANT
CUFF CRYO KNEE LG 20X31 COOLER (ORTHOPEDIC SUPPLIES) ×2 IMPLANT
CUFF CRYO KNEE18X23 MED (MISCELLANEOUS) IMPLANT
CUFF TOURNIQUET SINGLE 34IN LL (TOURNIQUET CUFF) ×2 IMPLANT
CUFF TOURNIQUET SINGLE 44IN (TOURNIQUET CUFF) IMPLANT
CUTTER ANGLED DBL BITE 4.5 (BURR) IMPLANT
DECANTER SPIKE VIAL GLASS SM (MISCELLANEOUS) ×4 IMPLANT
GAUZE SPONGE 4X4 16PLY XRAY LF (GAUZE/BANDAGES/DRESSINGS) ×2 IMPLANT
GAUZE XEROFORM 5X9 LF (GAUZE/BANDAGES/DRESSINGS) ×2 IMPLANT
GLOVE BIOGEL PI IND STRL 7.0 (GLOVE) ×2 IMPLANT
GLOVE BIOGEL PI IND STRL 8 (GLOVE) ×2 IMPLANT
GLOVE BIOGEL PI INDICATOR 7.0 (GLOVE) ×2
GLOVE BIOGEL PI INDICATOR 8 (GLOVE) ×2
GLOVE INDICATOR 7.0 STRL GRN (GLOVE) ×4 IMPLANT
GLOVE SKINSENSE NS SZ8.0 LF (GLOVE) ×1
GLOVE SKINSENSE STRL SZ8.0 LF (GLOVE) ×1 IMPLANT
GLOVE SS BIOGEL STRL SZ 6.5 (GLOVE) ×1 IMPLANT
GLOVE SS N UNI LF 8.5 STRL (GLOVE) ×2 IMPLANT
GLOVE SUPERSENSE BIOGEL SZ 6.5 (GLOVE) ×1
GOWN STRL REIN XL XLG (GOWN DISPOSABLE) ×6 IMPLANT
HLDR LEG FOAM (MISCELLANEOUS) ×1 IMPLANT
IV NS IRRIG 3000ML ARTHROMATIC (IV SOLUTION) ×8 IMPLANT
KIT BLADEGUARD II DBL (SET/KITS/TRAYS/PACK) ×2 IMPLANT
KIT ROOM TURNOVER AP CYSTO (KITS) ×2 IMPLANT
LEG HOLDER FOAM (MISCELLANEOUS) ×1
MANIFOLD NEPTUNE II (INSTRUMENTS) ×2 IMPLANT
MARKER SKIN DUAL TIP RULER LAB (MISCELLANEOUS) ×2 IMPLANT
NEEDLE HYPO 18GX1.5 BLUNT FILL (NEEDLE) ×2 IMPLANT
NEEDLE HYPO 21X1.5 SAFETY (NEEDLE) ×2 IMPLANT
NEEDLE SPNL 18GX3.5 QUINCKE PK (NEEDLE) ×2 IMPLANT
NS IRRIG 1000ML POUR BTL (IV SOLUTION) ×2 IMPLANT
PACK ARTHRO LIMB DRAPE STRL (MISCELLANEOUS) ×2 IMPLANT
PAD ABD 5X9 TENDERSORB (GAUZE/BANDAGES/DRESSINGS) ×2 IMPLANT
PAD ARMBOARD 7.5X6 YLW CONV (MISCELLANEOUS) ×2 IMPLANT
PADDING CAST COTTON 6X4 STRL (CAST SUPPLIES) ×2 IMPLANT
SET ARTHROSCOPY INST (INSTRUMENTS) ×2 IMPLANT
SET ARTHROSCOPY PUMP TUBE (IRRIGATION / IRRIGATOR) ×2 IMPLANT
SET BASIN LINEN APH (SET/KITS/TRAYS/PACK) ×2 IMPLANT
SPONGE GAUZE 4X4 12PLY (GAUZE/BANDAGES/DRESSINGS) ×2 IMPLANT
SUT ETHILON 3 0 FSL (SUTURE) ×2 IMPLANT
SYR 30ML LL (SYRINGE) ×2 IMPLANT
SYRINGE 10CC LL (SYRINGE) ×2 IMPLANT
WAND 50 DEG COVAC W/CORD (SURGICAL WAND) ×2 IMPLANT
WAND 90 DEG TURBOVAC W/CORD (SURGICAL WAND) IMPLANT
WAND ARTHRO PARAGON T2 (SURGICAL WAND) IMPLANT
YANKAUER SUCT BULB TIP 10FT TU (MISCELLANEOUS) ×8 IMPLANT

## 2012-05-10 NOTE — Transfer of Care (Signed)
Immediate Anesthesia Transfer of Care Note  Patient: Chad Ellis  Procedure(s) Performed: Procedure(s) with comments: KNEE ARTHROSCOPY WITH MEDIAL MENISECTOMY (Right) EXAM UNDER ANESTHESIA WITH MANIPULATION OF KNEE (Right) - start 0750 end 751 CHONDROPLASTY (Right) - of patella KNEE ARTHROSCOPY WITH EXCISION PLICA (Right)  Patient Location: PACU  Anesthesia Type:General  Level of Consciousness: awake and patient cooperative  Airway & Oxygen Therapy: Patient Spontanous Breathing and Patient connected to face mask oxygen  Post-op Assessment: Report given to PACU RN, Post -op Vital signs reviewed and stable and Patient moving all extremities  Post vital signs: Reviewed and stable  Complications: No apparent anesthesia complications

## 2012-05-10 NOTE — Anesthesia Preprocedure Evaluation (Signed)
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 breath sounds clear to auscultation        Cardiovascular hypertension (borderline), Rhythm:Regular Rate:Normal     Neuro/Psych Anxiety    GI/Hepatic negative GI ROS,   Endo/Other    Renal/GU      Musculoskeletal   Abdominal   Peds  Hematology   Anesthesia Other Findings   Reproductive/Obstetrics                           Anesthesia Physical Anesthesia Plan  ASA: II  Anesthesia Plan: General   Post-op Pain Management:    Induction: Intravenous  Airway Management Planned: LMA  Additional Equipment:   Intra-op Plan:   Post-operative Plan: Extubation in OR  Informed Consent: I have reviewed the patients History and Physical, chart, labs and discussed the procedure including the risks, benefits and alternatives for the proposed anesthesia with the patient or authorized representative who has indicated his/her understanding and acceptance.     Plan Discussed with:   Anesthesia Plan Comments:         Anesthesia Quick Evaluation

## 2012-05-10 NOTE — Anesthesia Procedure Notes (Signed)
Procedure Name: LMA Insertion Date/Time: 05/10/2012 7:46 AM Performed by: Despina Hidden Pre-anesthesia Checklist: Patient identified, Patient being monitored, Emergency Drugs available and Suction available Patient Re-evaluated:Patient Re-evaluated prior to inductionOxygen Delivery Method: Circle system utilized Preoxygenation: Pre-oxygenation with 100% oxygen Intubation Type: IV induction Ventilation: Mask ventilation without difficulty LMA Size: 4.0 Tube type: Oral Number of attempts: 1 Tube secured with: Tape Dental Injury: Teeth and Oropharynx as per pre-operative assessment

## 2012-05-10 NOTE — Op Note (Signed)
05/10/2012  9:29 AM  PATIENT:  Chad Ellis  52 y.o. male  PRE-OPERATIVE DIAGNOSIS:  Torn medial meniscus, acl tear pcl avulsion, chondromalcia patella  POST-OPERATIVE DIAGNOSIS: Torn medial meniscus, anterior cruciate ligament tear, PCL avulsion, chondromalacia patella + plica   PROCEDURE:  Procedure(s) with comments: KNEE ARTHROSCOPY WITH MEDIAL MENISECTOMY (Right) EXAM UNDER ANESTHESIA WITH MANIPULATION OF KNEE (Right) - start 0750 end 751 CHONDROPLASTY (Right) - of patella KNEE ARTHROSCOPY WITH EXCISION PLICA (Right)  Operative findings exam under anesthesia revealed the following the collateral ligaments were stable at 0 and 30. The dial test was normal at 90 and 30. The Lachman test was grade 0. The pivot shift was grade 0. The PCL posterior drawer test was grade 1. The patient had 5 mm anterior position of the tibia in relation to the medial femoral condyle.  In the joint the medial meniscus was torn in the posterior horn. There is intrasubstance stretching of the anterior cruciate ligament with evidence of partial tibial attachment injury without frank separation. Under arthroscopic visualization the anterior drawer test showed definite integrity of the anterior cruciate ligament. On the femoral side both bundles were attached to the lateral femoral condyle.  The lateral compartment was normal. Patellofemoral joint had chronic changes of arthritic cartilage consistent with a grade 3 chondral lesion on the trochlea and a grade 2 chondral lesion of the patella  Details of procedure  The patient was identified in the preop holding area and the right knee confirmed and was marked as a surgical site. The chart update was completed  The patient was taken to the operating room for general anesthesia. He was given 2 g of Ancef IV. After positioning in the supine position and the first timeout an exam under anesthesia was completed.  The patient was then prepped and draped sterilely  with the leg an arthroscopic leg holder. The well leg was placed in a padded leg holder.  A second timeout was completed  A diagnostic arthroscopy was performed with the scope starting in the medial compartment and then a circumferential examination of the knee was done arthroscopically. A portal was established medially and a chondroplasty was performed of the patella. The fibrillation that was noted on the patella side was debrided down to a stable base and a similar debridement was performed on the trochlear side.  A plical shelf was noted to rubbed over the medial femoral condyle with cartilage erosion partial. This was resected. Further resection of synovium was performed to increase visualization. A medial meniscectomy was performed, partial, of the posterior horn. A stable rim was confirmed with the probe. Chondral surfaces medially were normal.  Further debridement anteriorly was done to evaluate the tibial attachment site of the anterior cruciate ligament. And although there was definite injury there are there was no frank detachment. The anterior cruciate ligament was further evaluated on the femoral side and both bundles were visually intact on the femoral side. A drawer test was performed on the anterior cruciate ligament and had good firm endpoint. Interstitial fiber injury was noted.  The PCL was evaluated as well. No laxity was noted there.  The knee was irrigated and suctioned dry and closed with 2 3-0 nylon sutures in each portal.  SURGEON:  Surgeon(s) and Role:    * Vickki Hearing, MD - Primary  PHYSICIAN ASSISTANT:   ASSISTANTS: none   ANESTHESIA:   general  EBL:  Total I/O In: 700 [I.V.:700] Out: -   BLOOD ADMINISTERED:none  DRAINS: none  LOCAL MEDICATIONS USED:  MARCAINE   , Amount: 60 ml and OTHER epi  SPECIMEN:  No Specimen  DISPOSITION OF SPECIMEN:  N/A  COUNTS:  YES  TOURNIQUET:    DICTATION: .Dragon Dictation  PLAN OF CARE: Discharge to home  after PACU  PATIENT DISPOSITION:  PACU - hemodynamically stable.   Delay start of Pharmacological VTE agent (>24hrs) due to surgical blood loss or risk of bleeding: not applicable

## 2012-05-10 NOTE — Brief Op Note (Addendum)
05/10/2012  9:29 AM  PATIENT:  Chad Ellis  52 y.o. male  PRE-OPERATIVE DIAGNOSIS:  Torn medial meniscus, acl tear pcl avulsion, chondromalcia patella  POST-OPERATIVE DIAGNOSIS: same + plica   PROCEDURE:  Procedure(s) with comments: KNEE ARTHROSCOPY WITH MEDIAL MENISECTOMY (Right) EXAM UNDER ANESTHESIA WITH MANIPULATION OF KNEE (Right) - start 0750 end 751 CHONDROPLASTY (Right) - of patella KNEE ARTHROSCOPY WITH EXCISION PLICA (Right)  SURGEON:  Surgeon(s) and Role:    * Vickki Hearing, MD - Primary  PHYSICIAN ASSISTANT:   ASSISTANTS: none   ANESTHESIA:   general  EBL:  Total I/O In: 700 [I.V.:700] Out: -   BLOOD ADMINISTERED:none  DRAINS: none   LOCAL MEDICATIONS USED:  MARCAINE   , Amount: 60 ml and OTHER epi  SPECIMEN:  No Specimen  DISPOSITION OF SPECIMEN:  N/A  COUNTS:  YES  TOURNIQUET:    DICTATION: .Dragon Dictation  PLAN OF CARE: Discharge to home after PACU  PATIENT DISPOSITION:  PACU - hemodynamically stable.   Delay start of Pharmacological VTE agent (>24hrs) due to surgical blood loss or risk of bleeding: not applicable

## 2012-05-10 NOTE — Anesthesia Postprocedure Evaluation (Signed)
  Anesthesia Post-op Note  Patient: Chad Ellis  Procedure(s) Performed: Procedure(s) with comments: KNEE ARTHROSCOPY WITH MEDIAL MENISECTOMY (Right) EXAM UNDER ANESTHESIA WITH MANIPULATION OF KNEE (Right) - start 0750 end 751 CHONDROPLASTY (Right) - of patella KNEE ARTHROSCOPY WITH EXCISION PLICA (Right)  Patient Location: PACU  Anesthesia Type:General  Level of Consciousness: awake, alert , oriented and patient cooperative  Airway and Oxygen Therapy: Patient Spontanous Breathing  Post-op Pain: 3 /10, mild  Post-op Assessment: Post-op Vital signs reviewed, Patient's Cardiovascular Status Stable, Respiratory Function Stable, Patent Airway, No signs of Nausea or vomiting, Adequate PO intake and Pain level controlled  Post-op Vital Signs: Reviewed and stable  Complications: No apparent anesthesia complications

## 2012-05-10 NOTE — Interval H&P Note (Signed)
History and Physical Interval Note:  05/10/2012 7:29 AM  Chad Ellis  has presented today for surgery, with the diagnosis of right tibial fracture  The various methods of treatment have been discussed with the patient and family. After consideration of risks, benefits and other options for treatment, the patient has consented to  Procedure(s): KNEE ARTHROSCOPY WITH MEDIAL MENISECTOMY (Right) EXAM UNDER ANESTHESIA WITH MANIPULATION OF KNEE (Right) as a surgical intervention .  The patient's history has been reviewed, patient examined, no change in status, stable for surgery.  I have reviewed the patient's chart and labs.  Questions were answered to the patient's satisfaction.     Fuller Canada EUA RIGHT KNEE ARTHROSCOPY MEDIAL MENISECTOMY

## 2012-05-10 NOTE — CV Procedure (Signed)
05/10/2012  9:29 AM  PATIENT:  Chad Ellis  52 y.o. male  PRE-OPERATIVE DIAGNOSIS:  Torn medial meniscus, acl tear pcl avulsion, chondromalcia patella  POST-OPERATIVE DIAGNOSIS: Torn medial meniscus, anterior cruciate ligament tear, PCL avulsion, chondromalacia patella + plica   PROCEDURE:  Procedure(s) with comments: KNEE ARTHROSCOPY WITH MEDIAL MENISECTOMY (Right) EXAM UNDER ANESTHESIA WITH MANIPULATION OF KNEE (Right) - start 0750 end 751 CHONDROPLASTY (Right) - of patella KNEE ARTHROSCOPY WITH EXCISION PLICA (Right)  Operative findings exam under anesthesia revealed the following the collateral ligaments were stable at 0 and 30. The dial test was normal at 90 and 30. The Lachman test was grade 0. The pivot shift was grade 0. The PCL posterior drawer test was grade 1. The patient had 5 mm anterior position of the tibia in relation to the medial femoral condyle.  In the joint the medial meniscus was torn in the posterior horn. There is intrasubstance stretching of the anterior cruciate ligament with evidence of partial tibial attachment injury without frank separation. Under arthroscopic visualization the anterior drawer test showed definite integrity of the anterior cruciate ligament. On the femoral side both bundles were attached to the lateral femoral condyle.  The lateral compartment was normal. Patellofemoral joint had chronic changes of arthritic cartilage consistent with a grade 3 chondral lesion on the trochlea and a grade 2 chondral lesion of the patella  Details of procedure  The patient was identified in the preop holding area and the right knee confirmed and was marked as a surgical site. The chart update was completed  The patient was taken to the operating room for general anesthesia. He was given 2 g of Ancef IV. After positioning in the supine position and the first timeout an exam under anesthesia was completed.  The patient was then prepped and draped sterilely  with the leg an arthroscopic leg holder. The well leg was placed in a padded leg holder.  A second timeout was completed  A diagnostic arthroscopy was performed with the scope starting in the medial compartment and then a circumferential examination of the knee was done arthroscopically. A portal was established medially and a chondroplasty was performed of the patella. The fibrillation that was noted on the patella side was debrided down to a stable base and a similar debridement was performed on the trochlear side.  A plical shelf was noted to rubbed over the medial femoral condyle with cartilage erosion partial. This was resected. Further resection of synovium was performed to increase visualization. A medial meniscectomy was performed, partial, of the posterior horn. A stable rim was confirmed with the probe. Chondral surfaces medially were normal.  Further debridement anteriorly was done to evaluate the tibial attachment site of the anterior cruciate ligament. And although there was definite injury there are there was no frank detachment. The anterior cruciate ligament was further evaluated on the femoral side and both bundles were visually intact on the femoral side. A drawer test was performed on the anterior cruciate ligament and had good firm endpoint. Interstitial fiber injury was noted.  The PCL was evaluated as well. No laxity was noted there.  The knee was irrigated and suctioned dry and closed with 2 3-0 nylon sutures in each portal.  SURGEON:  Surgeon(s) and Role:    * Stanley E Harrison, MD - Primary  PHYSICIAN ASSISTANT:   ASSISTANTS: none   ANESTHESIA:   general  EBL:  Total I/O In: 700 [I.V.:700] Out: -   BLOOD ADMINISTERED:none  DRAINS: none     LOCAL MEDICATIONS USED:  MARCAINE   , Amount: 60 ml and OTHER epi  SPECIMEN:  No Specimen  DISPOSITION OF SPECIMEN:  N/A  COUNTS:  YES  TOURNIQUET:    DICTATION: .Dragon Dictation  PLAN OF CARE: Discharge to home  after PACU  PATIENT DISPOSITION:  PACU - hemodynamically stable.   Delay start of Pharmacological VTE agent (>24hrs) due to surgical blood loss or risk of bleeding: not applicable 

## 2012-05-13 ENCOUNTER — Ambulatory Visit: Payer: Self-pay | Admitting: Orthopedic Surgery

## 2012-05-13 ENCOUNTER — Encounter (HOSPITAL_COMMUNITY): Payer: Self-pay | Admitting: Orthopedic Surgery

## 2012-05-14 ENCOUNTER — Ambulatory Visit (INDEPENDENT_AMBULATORY_CARE_PROVIDER_SITE_OTHER): Payer: Worker's Compensation | Admitting: Orthopedic Surgery

## 2012-05-14 ENCOUNTER — Telehealth: Payer: Self-pay | Admitting: Orthopedic Surgery

## 2012-05-14 VITALS — BP 148/88 | Ht 73.5 in | Wt 260.0 lb

## 2012-05-14 DIAGNOSIS — S8991XS Unspecified injury of right lower leg, sequela: Secondary | ICD-10-CM

## 2012-05-14 DIAGNOSIS — M6751 Plica syndrome, right knee: Secondary | ICD-10-CM

## 2012-05-14 DIAGNOSIS — IMO0001 Reserved for inherently not codable concepts without codable children: Secondary | ICD-10-CM

## 2012-05-14 DIAGNOSIS — IMO0002 Reserved for concepts with insufficient information to code with codable children: Secondary | ICD-10-CM

## 2012-05-14 DIAGNOSIS — M238X1 Other internal derangements of right knee: Secondary | ICD-10-CM

## 2012-05-14 DIAGNOSIS — M2241 Chondromalacia patellae, right knee: Secondary | ICD-10-CM

## 2012-05-14 DIAGNOSIS — M2240 Chondromalacia patellae, unspecified knee: Secondary | ICD-10-CM

## 2012-05-14 DIAGNOSIS — M6750 Plica syndrome, unspecified knee: Secondary | ICD-10-CM

## 2012-05-14 DIAGNOSIS — M2390 Unspecified internal derangement of unspecified knee: Secondary | ICD-10-CM

## 2012-05-14 NOTE — Progress Notes (Signed)
Patient ID: Chad Ellis, male   DOB: 05-Jan-1961, 52 y.o.   MRN: [id number] Chief Complaint  Patient presents with  . Follow-up    Post op #1, right knee. DOS 05-10-12.    BP 148/88  Ht 6' 1.5" (1.867 m)  Wt 260 lb (117.935 kg)  BMI 33.83 kg/m2  Tear of medial cartilage or meniscus of knee, current - Plan: Ambulatory referral to Physical Therapy  ACL laxity, right - Plan: Ambulatory referral to Physical Therapy  Chondromalacia patellae of right knee - Plan: Ambulatory referral to Physical Therapy  PCL injury, right, sequela - Plan: Ambulatory referral to Physical Therapy  Plica syndrome of right knee - Plan: Ambulatory referral to Physical Therapy    The patient is status post arthroscopy right knee partial medial meniscectomy resection of plica and chondromalacia debridement patella  Doing well ambulating 1 crutch flexion ARC 90  Wounds look clean sutures are removed  Recommend start physical therapy come back in 3 weeks

## 2012-05-14 NOTE — Telephone Encounter (Signed)
Called Workers Emergency planning/management officer, [employer], left voice message for Dustin Folks adjustor, regarding request for physical therapy provider within their network  - faxed order and faxed office note (of 05/14/12) and operative note, at Fax # [phone], ph # [phone].

## 2012-05-14 NOTE — Patient Instructions (Addendum)
Call work Comp   The patient needs rehab 3 x a week x 3 weeks

## 2012-05-16 ENCOUNTER — Telehealth: Payer: Self-pay | Admitting: Orthopedic Surgery

## 2012-05-16 ENCOUNTER — Encounter: Payer: Self-pay | Admitting: Orthopedic Surgery

## 2012-05-16 NOTE — Telephone Encounter (Signed)
Called back to Workers Comp, [employer], Dustin Folks to follow up on physical therapy. Verbal approval received for C[facility] Mulligan, [place] - called patient to notify.

## 2012-05-16 NOTE — Telephone Encounter (Signed)
Faxed therapy order as noted per approval by Workers Comp, adjuster Dustin Folks, to: Cato Mulligan, fax [phone], ph [phone].  Patient aware, and will call to schedule appointment.

## 2012-06-04 ENCOUNTER — Ambulatory Visit (INDEPENDENT_AMBULATORY_CARE_PROVIDER_SITE_OTHER): Payer: Worker's Compensation | Admitting: Orthopedic Surgery

## 2012-06-04 VITALS — BP 154/98 | Ht 73.5 in | Wt 260.0 lb

## 2012-06-04 DIAGNOSIS — Z5189 Encounter for other specified aftercare: Secondary | ICD-10-CM

## 2012-06-04 DIAGNOSIS — M238X1 Other internal derangements of right knee: Secondary | ICD-10-CM

## 2012-06-04 DIAGNOSIS — M2241 Chondromalacia patellae, right knee: Secondary | ICD-10-CM

## 2012-06-04 DIAGNOSIS — S8991XD Unspecified injury of right lower leg, subsequent encounter: Secondary | ICD-10-CM

## 2012-06-04 DIAGNOSIS — M6751 Plica syndrome, right knee: Secondary | ICD-10-CM

## 2012-06-04 DIAGNOSIS — IMO0002 Reserved for concepts with insufficient information to code with codable children: Secondary | ICD-10-CM

## 2012-06-04 DIAGNOSIS — M6750 Plica syndrome, unspecified knee: Secondary | ICD-10-CM

## 2012-06-04 DIAGNOSIS — M2390 Unspecified internal derangement of unspecified knee: Secondary | ICD-10-CM

## 2012-06-04 DIAGNOSIS — M2240 Chondromalacia patellae, unspecified knee: Secondary | ICD-10-CM

## 2012-06-04 MED ORDER — HYDROCODONE-ACETAMINOPHEN 5-325 MG PO TABS: 1.0000 | ORAL_TABLET | ORAL | Status: DC | PRN

## 2012-06-04 NOTE — Patient Instructions (Addendum)
Continue therapy  Take aleve 2 x a day   Take Norco as needed

## 2012-06-04 NOTE — Progress Notes (Signed)
Patient ID: Chad Ellis, male   DOB: 1961-02-07, 52 y.o.   MRN: [id number] Chief Complaint  Patient presents with  . Follow-up    Post op 2 SARK DOS 05/10/12    BP 154/98  Ht 6' 1.5" (1.867 m)  Wt 260 lb (117.935 kg)  BMI 33.83 kg/m2  History status post arthroscopy right knee [facility] at [facility] for therapy. Complains of some pain behind his knee radiating up into his back which may be hamstring or sciatic nerve related  His knee is feeling better and better  He still has some swelling in the joint it's decreasing his range of motion is improved to greater than 100. He still has the anteroposterior mild laxity with a firm endpoint  He is more symptomatic out of his brace and he is in it he is encouraged now to continue it until his knee becomes strong  Continue therapy followup in a month continue Norco for pain and use Aleve as this does not bother his stomach  Tear of medial cartilage or meniscus of knee, current  Plica syndrome of right knee  PCL injury, right, subsequent encounter  Chondromalacia patellae of right knee  ACL laxity, right

## 2012-06-05 ENCOUNTER — Telehealth: Payer: Self-pay | Admitting: Orthopedic Surgery

## 2012-06-05 NOTE — Telephone Encounter (Signed)
As per request, office notes DOS 05/14/12,06/04/12 to Workers Comp insurer, [employer], Attention: Dustin Folks, Claims representative/adjuster, to fax # [phone], ph # [phone].

## 2012-06-06 ENCOUNTER — Encounter: Payer: Self-pay | Admitting: Orthopedic Surgery

## 2012-06-06 NOTE — Telephone Encounter (Signed)
Received, however, work note also needed.  Faxed to same # as per request 06/06/12.

## 2012-06-19 ENCOUNTER — Telehealth: Payer: Self-pay | Admitting: Orthopedic Surgery

## 2012-06-19 NOTE — Telephone Encounter (Signed)
Per signed authorization request, faxed all work notes to [employer], fax# [phone], ph# [phone]. Patient aware.

## 2012-07-04 ENCOUNTER — Ambulatory Visit (INDEPENDENT_AMBULATORY_CARE_PROVIDER_SITE_OTHER): Payer: Worker's Compensation | Admitting: Orthopedic Surgery

## 2012-07-04 ENCOUNTER — Encounter: Payer: Self-pay | Admitting: Orthopedic Surgery

## 2012-07-04 VITALS — BP 164/102 | Ht 73.5 in | Wt 260.0 lb

## 2012-07-04 DIAGNOSIS — M6750 Plica syndrome, unspecified knee: Secondary | ICD-10-CM

## 2012-07-04 DIAGNOSIS — M2241 Chondromalacia patellae, right knee: Secondary | ICD-10-CM

## 2012-07-04 DIAGNOSIS — Z5189 Encounter for other specified aftercare: Secondary | ICD-10-CM

## 2012-07-04 DIAGNOSIS — IMO0002 Reserved for concepts with insufficient information to code with codable children: Secondary | ICD-10-CM

## 2012-07-04 DIAGNOSIS — M25569 Pain in unspecified knee: Secondary | ICD-10-CM

## 2012-07-04 DIAGNOSIS — M2240 Chondromalacia patellae, unspecified knee: Secondary | ICD-10-CM

## 2012-07-04 DIAGNOSIS — M6751 Plica syndrome, right knee: Secondary | ICD-10-CM

## 2012-07-04 DIAGNOSIS — M25561 Pain in right knee: Secondary | ICD-10-CM

## 2012-07-04 DIAGNOSIS — S8991XD Unspecified injury of right lower leg, subsequent encounter: Secondary | ICD-10-CM

## 2012-07-04 MED ORDER — HYDROCODONE-ACETAMINOPHEN 5-325 MG PO TABS: 1.0000 | ORAL_TABLET | ORAL | Status: DC | PRN

## 2012-07-04 MED ORDER — IBUPROFEN 800 MG PO TABS: 800.0000 mg | ORAL_TABLET | Freq: Three times a day (TID) | ORAL | Status: DC | PRN

## 2012-07-04 NOTE — Progress Notes (Signed)
Patient ID: Chad Ellis, male   DOB: [birth year], 52 y.o.   MRN: [id number] Chief Complaint  Patient presents with  . Follow-up    1 month recheck on SARK, DOS 05-10-12.    BP 164/102  Ht 6' 1.5" (1.867 m)  Wt 260 lb (117.935 kg)  BMI 33.83 kg/m2  Right knee pain - Plan: HYDROcodone-acetaminophen (NORCO) 5-325 MG per tablet, ibuprofen (ADVIL,MOTRIN) 800 MG tablet  PCL injury, right, subsequent encounter  Plica syndrome of right knee  Tear of medial cartilage or meniscus of knee, current  Chondromalacia patellae of right knee   Therapy at their [facility] with progress Notes  Patient improving therapy recommending more treatment secondary to patient's weakness  Patient says his knee feels a little and stable without his brace  Today's exam shows he's regained full extension he has full passive flexion he has a trace joint effusion he has a grade 1 PCL laxity no anterior cruciate ligament laxity collateral ligaments are stable  Impression improving status post arthroscopy partial medial meniscectomy resection of plica and exam under anesthesia right knee  Continue therapy return to office prior to May 28 for his final checkup  Continue current medications.

## 2012-07-04 NOTE — Patient Instructions (Signed)
Continue tharapy Continue brace

## 2012-07-09 ENCOUNTER — Telehealth: Payer: Self-pay | Admitting: Orthopedic Surgery

## 2012-07-09 NOTE — Telephone Encounter (Signed)
Office notes and work note for date of service 07/04/12 faxed to Workers Comp insurer, [employer], fax [phone], ph [phone], attention: Dustin Folks, adjuster.

## 2012-08-01 ENCOUNTER — Ambulatory Visit (INDEPENDENT_AMBULATORY_CARE_PROVIDER_SITE_OTHER): Payer: Worker's Compensation | Admitting: Orthopedic Surgery

## 2012-08-01 ENCOUNTER — Encounter: Payer: Self-pay | Admitting: Orthopedic Surgery

## 2012-08-01 VITALS — BP 130/88 | Ht 73.5 in | Wt 260.0 lb

## 2012-08-01 DIAGNOSIS — M2241 Chondromalacia patellae, right knee: Secondary | ICD-10-CM

## 2012-08-01 DIAGNOSIS — IMO0002 Reserved for concepts with insufficient information to code with codable children: Secondary | ICD-10-CM

## 2012-08-01 DIAGNOSIS — M25561 Pain in right knee: Secondary | ICD-10-CM

## 2012-08-01 DIAGNOSIS — S8991XD Unspecified injury of right lower leg, subsequent encounter: Secondary | ICD-10-CM

## 2012-08-01 DIAGNOSIS — Z5189 Encounter for other specified aftercare: Secondary | ICD-10-CM

## 2012-08-01 DIAGNOSIS — M2240 Chondromalacia patellae, unspecified knee: Secondary | ICD-10-CM

## 2012-08-01 DIAGNOSIS — M6751 Plica syndrome, right knee: Secondary | ICD-10-CM

## 2012-08-01 DIAGNOSIS — M6750 Plica syndrome, unspecified knee: Secondary | ICD-10-CM

## 2012-08-01 DIAGNOSIS — M25569 Pain in unspecified knee: Secondary | ICD-10-CM

## 2012-08-01 MED ORDER — HYDROCODONE-ACETAMINOPHEN 5-325 MG PO TABS: 1.0000 | ORAL_TABLET | Freq: Four times a day (QID) | ORAL | Status: DC | PRN

## 2012-08-01 NOTE — Progress Notes (Signed)
Patient ID: Chad Ellis, male   DOB: [birth year], 52 y.o.   MRN: [id number] Chief Complaint  Patient presents with  . Follow-up    recheck SARK DOS 05/10/12    Ht 6' 1.5" (1.867 m)  Wt 260 lb (117.935 kg)  BMI 33.83 kg/m2  PCL injury, right, subsequent encounter Plica syndrome of right knee Tear of medial cartilage or meniscus of knee, current Chondromalacia patellae of right knee Therapy at [facility]  Review of systems negative except for his depression from losing his home and wife split up secondary to his anger and inability to work and lost money financial trouble etc.  Also lost his best friend  The patient continues to improve. He said therapy now for about 3-1/2-4 months he is doing better he has excellent range of motion in his knee has a 1+ posterior drawer no collateral ligament instability is a mild effusion  Wear a hinged knee brace  Continued therapy follow up in a month

## 2012-08-01 NOTE — Patient Instructions (Addendum)
Plan from here forward  You'll probably need a knee brace from here on out. Try to wean yourself from the hydrocodone and use over-the-counter medications such as ibuprofen or Tylenol for pain. Continue strengthening exercises until August.  Continue PT for 1 month

## 2012-08-07 ENCOUNTER — Telehealth: Payer: Self-pay | Admitting: Orthopedic Surgery

## 2012-08-07 NOTE — Telephone Encounter (Signed)
Notes and work note, DOS 08/01/12, faxed to Workers [employer], attentino: Dustin Folks, adjuster, fax # [phone], ph [phone], per request, per call from Ms. Gamble. Patient aware.

## 2012-09-03 ENCOUNTER — Encounter: Payer: Self-pay | Admitting: Orthopedic Surgery

## 2012-09-03 ENCOUNTER — Ambulatory Visit (INDEPENDENT_AMBULATORY_CARE_PROVIDER_SITE_OTHER): Payer: Worker's Compensation | Admitting: Orthopedic Surgery

## 2012-09-03 VITALS — BP 144/100 | Ht 73.5 in | Wt 260.0 lb

## 2012-09-03 DIAGNOSIS — M25569 Pain in unspecified knee: Secondary | ICD-10-CM

## 2012-09-03 DIAGNOSIS — Z5189 Encounter for other specified aftercare: Secondary | ICD-10-CM

## 2012-09-03 DIAGNOSIS — M2240 Chondromalacia patellae, unspecified knee: Secondary | ICD-10-CM

## 2012-09-03 DIAGNOSIS — M2241 Chondromalacia patellae, right knee: Secondary | ICD-10-CM

## 2012-09-03 DIAGNOSIS — M25561 Pain in right knee: Secondary | ICD-10-CM

## 2012-09-03 DIAGNOSIS — S8991XD Unspecified injury of right lower leg, subsequent encounter: Secondary | ICD-10-CM

## 2012-09-03 DIAGNOSIS — M6750 Plica syndrome, unspecified knee: Secondary | ICD-10-CM

## 2012-09-03 DIAGNOSIS — M6751 Plica syndrome, right knee: Secondary | ICD-10-CM

## 2012-09-03 DIAGNOSIS — IMO0002 Reserved for concepts with insufficient information to code with codable children: Secondary | ICD-10-CM

## 2012-09-03 MED ORDER — HYDROCODONE-ACETAMINOPHEN 5-325 MG PO TABS: 1.0000 | ORAL_TABLET | Freq: Four times a day (QID) | ORAL | Status: DC | PRN

## 2012-09-03 NOTE — Progress Notes (Signed)
Patient ID: Chad Ellis, male   DOB: 1960/07/16, 52 y.o.   MRN: [id number]  Chief Complaint  Patient presents with  . Follow-up    1 month recheck right knee pcl post op SARK 05/10/12   Encounter Diagnoses  Name Primary?  . Right knee pain Yes  . Chondromalacia patellae of right knee   . PCL injury, right, subsequent encounter   . Plica syndrome of right knee   . Tear of medial cartilage or meniscus of knee, current    BP 144/100  Ht 6' 1.5" (1.867 m)  Wt 260 lb (117.935 kg)  BMI 33.83 kg/m2  The patient continues to improve with therapy [facility]  He lacks confidence in his knee he has some difficulty going downstairs which is consistent with his PCL injury area is wearing a leg brace/hinged knee brace  Is not having any problems going forward he says he has some difficulties when a shifting side to side  He has mild pain requires Norco and ibuprofen for pain relief  He just had some ankle pain which turned out to be gout he was treated he has improved  As far as his knee goes he has grade 1 PCL instability stable anterior cruciate ligament stable collateral ligaments at 0 and 30 and external rotation at 30 and 90 full range of motion no swelling. Quadriceps muscle tone normal although he is weak compared right to left. Neurovascular exam is intact  Impression as noted above PCL injury with chondromalacia patella medial meniscal tear  He is 4 months in surgery 8 months since injury is made excellent progress  He will continue his therapy and see me in 2 months for his 6 month postop visit

## 2012-10-08 ENCOUNTER — Telehealth: Payer: Self-pay | Admitting: Orthopedic Surgery

## 2012-10-08 NOTE — Telephone Encounter (Signed)
Fax of work note,office note 09/03/12, faxed as per request

## 2012-10-08 NOTE — Telephone Encounter (Signed)
(  continued) faxed work status note 09/03/12 to Workers Comp, [employer], attention: Dustin Folks, fax # [phone], ph # [phone].

## 2012-11-05 ENCOUNTER — Encounter: Payer: Self-pay | Admitting: Orthopedic Surgery

## 2012-11-05 ENCOUNTER — Ambulatory Visit (INDEPENDENT_AMBULATORY_CARE_PROVIDER_SITE_OTHER): Payer: Worker's Compensation | Admitting: Orthopedic Surgery

## 2012-11-05 VITALS — BP 130/98 | Ht 73.5 in | Wt 260.0 lb

## 2012-11-05 DIAGNOSIS — Z5189 Encounter for other specified aftercare: Secondary | ICD-10-CM

## 2012-11-05 DIAGNOSIS — M23321 Other meniscus derangements, posterior horn of medial meniscus, right knee: Secondary | ICD-10-CM

## 2012-11-05 DIAGNOSIS — M2241 Chondromalacia patellae, right knee: Secondary | ICD-10-CM

## 2012-11-05 DIAGNOSIS — M23329 Other meniscus derangements, posterior horn of medial meniscus, unspecified knee: Secondary | ICD-10-CM

## 2012-11-05 DIAGNOSIS — S8991XD Unspecified injury of right lower leg, subsequent encounter: Secondary | ICD-10-CM

## 2012-11-05 DIAGNOSIS — M2240 Chondromalacia patellae, unspecified knee: Secondary | ICD-10-CM

## 2012-11-05 NOTE — Patient Instructions (Addendum)
Follow as needed   You have a 15 % PPI for your knee

## 2012-11-05 NOTE — Progress Notes (Signed)
Patient ID: Chad Ellis, male   DOB: 1960-10-10, 52 y.o.   MRN: [id number]  Chief Complaint  Patient presents with  . Follow-up    2 month recheck right knee DOS 05/10/12   .  Chondromalacia patellae of right knee .  PCL injury, right, subsequent encounter .  Plica syndrome of right knee .  Tear of medial cartilage or meniscus of knee  The patient is here for six-month followup he is doing reasonably well. His following complaints are aching at night cracking and popping at night he can't kneel on the right knee even with the knee pad but he says overall his knee feels pretty good he takes ibuprofen for pain  See original history and physical for history but the patient injured his knee slipping on a oil slick spot had a knee surgery whihc revealed a grade 1 PCL tear or medial meniscal tear  the PCL was treated nonoperatively and the meniscus was resected partially   BP 130/98  Ht 6' 1.5" (1.867 m)  Wt 260 lb (117.935 kg)  BMI 33.83 kg/m2 General appearance is normal, the patient is alert and oriented x3 with normal mood and affect. 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

## 2012-11-06 ENCOUNTER — Encounter: Payer: Self-pay | Admitting: Orthopedic Surgery

## 2013-06-24 ENCOUNTER — Other Ambulatory Visit: Payer: Self-pay | Admitting: *Deleted

## 2013-06-24 DIAGNOSIS — M25561 Pain in right knee: Secondary | ICD-10-CM

## 2013-06-24 MED ORDER — IBUPROFEN 800 MG PO TABS: 800.0000 mg | ORAL_TABLET | Freq: Three times a day (TID) | ORAL | Status: DC | PRN

## 2013-07-08 ENCOUNTER — Ambulatory Visit: Payer: Worker's Compensation | Admitting: Urology

## 2013-11-25 ENCOUNTER — Ambulatory Visit (INDEPENDENT_AMBULATORY_CARE_PROVIDER_SITE_OTHER): Payer: BC Managed Care – PPO | Admitting: Urology

## 2013-11-25 DIAGNOSIS — N486 Induration penis plastica: Secondary | ICD-10-CM

## 2016-01-27 ENCOUNTER — Emergency Department (HOSPITAL_COMMUNITY)
Admission: EM | Admit: 2016-01-27 | Discharge: 2016-01-27 | Disposition: A | Discharge status: Home / Self Care | Payer: Worker's Compensation | Attending: Emergency Medicine | Admitting: Emergency Medicine

## 2016-01-27 ENCOUNTER — Encounter (HOSPITAL_COMMUNITY): Payer: Self-pay | Admitting: Emergency Medicine

## 2016-01-27 ENCOUNTER — Emergency Department (HOSPITAL_COMMUNITY): Payer: Worker's Compensation

## 2016-01-27 DIAGNOSIS — M25512 Pain in left shoulder: Secondary | ICD-10-CM

## 2016-01-27 DIAGNOSIS — I10 Essential (primary) hypertension: Secondary | ICD-10-CM

## 2016-01-27 DIAGNOSIS — Y9389 Activity, other specified: Secondary | ICD-10-CM | POA: Diagnosis not present

## 2016-01-27 DIAGNOSIS — W010XXA Fall on same level from slipping, tripping and stumbling without subsequent striking against object, initial encounter: Secondary | ICD-10-CM | POA: Insufficient documentation

## 2016-01-27 DIAGNOSIS — Y990 Civilian activity done for income or pay: Secondary | ICD-10-CM | POA: Diagnosis not present

## 2016-01-27 DIAGNOSIS — Z791 Long term (current) use of non-steroidal anti-inflammatories (NSAID): Secondary | ICD-10-CM | POA: Insufficient documentation

## 2016-01-27 DIAGNOSIS — Z79899 Other long term (current) drug therapy: Secondary | ICD-10-CM | POA: Diagnosis not present

## 2016-01-27 DIAGNOSIS — Y929 Unspecified place or not applicable: Secondary | ICD-10-CM | POA: Insufficient documentation

## 2016-01-27 DIAGNOSIS — S4992XA Unspecified injury of left shoulder and upper arm, initial encounter: Secondary | ICD-10-CM | POA: Diagnosis present

## 2016-01-27 MED ORDER — NAPROXEN 250 MG PO TABS
500.0000 mg | ORAL_TABLET | Freq: Once | ORAL | Status: AC
  Administered 2016-01-27: 500 mg via ORAL
  Filled 2016-01-27: qty 2

## 2016-01-27 MED ORDER — NAPROXEN 500 MG PO TABS: 500.0000 mg | ORAL_TABLET | Freq: Two times a day (BID) | ORAL | 0 refills | Status: DC

## 2016-01-27 MED ORDER — LISINOPRIL 10 MG PO TABS: 20.0000 mg | ORAL_TABLET | Freq: Once | ORAL | Status: DC

## 2016-01-27 MED ORDER — OXYCODONE-ACETAMINOPHEN 5-325 MG PO TABS: 1.0000 | ORAL_TABLET | Freq: Four times a day (QID) | ORAL | 0 refills | Status: DC | PRN

## 2016-01-27 NOTE — ED Notes (Signed)
Pt going to xray  

## 2016-01-27 NOTE — Discharge Instructions (Signed)
Please read and follow all provided instructions.  Your diagnoses today include:  1. Acute pain of left shoulder    Tests performed today include: Vital signs. See below for your results today.   Medications prescribed:  Take as prescribed   Home care instructions:  Follow any educational materials contained in this packet.  Follow-up instructions: Please follow-up with your primary care provider for further evaluation of symptoms and treatment   Return instructions:  Please return to the Emergency Department if you do not get better, if you get worse, or new symptoms OR  - Fever (temperature greater than 101.43F)  - Bleeding that does not stop with holding pressure to the area    -Severe pain (please note that you may be more sore the day after your accident)  - Chest Pain  - Difficulty breathing  - Severe nausea or vomiting  - Inability to tolerate food and liquids  - Passing out  - Skin becoming red around your wounds  - Change in mental status (confusion or lethargy)  - New numbness or weakness    Please return if you have any other emergent concerns.  Additional Information:  Your vital signs today were: BP (!) 185/121 (BP Location: Right Arm)    Pulse 105    Temp 98.3 F (36.8 C) (Oral)    Resp 18    SpO2 98%  If your blood pressure (BP) was elevated above 135/85 this visit, please have this repeated by your doctor within one month. ---------------

## 2016-01-27 NOTE — ED Notes (Signed)
B/P verified x3. PT states he used to take HTN meds but none in 5 months.

## 2016-01-27 NOTE — ED Notes (Signed)
Patient given discharge instruction, verbalized understand. Patient ambulatory out of the department.  

## 2016-01-27 NOTE — ED Notes (Signed)
Ice pack and sling applied

## 2016-01-27 NOTE — ED Triage Notes (Signed)
Pt states he slipped at work and fell on outstretched arms.  C/o left shoulder pain.

## 2016-01-27 NOTE — ED Provider Notes (Signed)
[facility] DEPT Provider Note   CSN: [id number] Arrival date & time: 01/27/16  1442     History   Chief Complaint Chief Complaint  Patient presents with  . Shoulder Injury    HPI Chad Ellis is a 55 y.o. male.  HPI  55 y.o. male hx HTN presents to the Emergency Department today complaining of left shoulder pain s/p mechanical fall at work. Notes working in the dark and slipping on dust causing him to fall forward. Braced his fall with both his hands outstretched. Notes pain mainly in left shoulder. No head trauma. No LOC. Rates pain 7/10. No OTC remedies prior to arrival. NO numbness/tingling. Notes pain on ROM. No other symptoms noted. Of note, pt used to take BP medications, but refuses to take them due to the way it makes him feel. Does not restrict diet either. No CP/SOB. No N/V. No headache.   Past Medical History:  Diagnosis Date  . Anxiety   . Diverticulosis     Patient Active Problem List   Diagnosis Date Noted  . Medial meniscus, posterior horn derangement 11/05/2012  . Plica syndrome of right knee 05/10/2012  . Chondromalacia patellae of right knee 05/10/2012  . PCL injury 01/29/2012  . ACL laxity 01/29/2012  . Tear of medial cartilage or meniscus of knee, current 01/15/2012    Past Surgical History:  Procedure Laterality Date  . CHONDROPLASTY Right 05/10/2012   Procedure: CHONDROPLASTY;  Surgeon: Carole Civil, MD;  Location: [facility];  Service: Orthopedics;  Laterality: Right;  of patella  . COLONOSCOPY    . ESOPHAGOGASTRODUODENOSCOPY    . EXAM UNDER ANESTHESIA WITH MANIPULATION OF KNEE Right 05/10/2012   Procedure: EXAM UNDER ANESTHESIA WITH MANIPULATION OF KNEE;  Surgeon: Carole Civil, MD;  Location: [facility];  Service: Orthopedics;  Laterality: Right;  start 0750 end 751  . KNEE ARTHROSCOPY WITH EXCISION PLICA Right [id number]   Procedure: KNEE ARTHROSCOPY WITH EXCISION PLICA;  Surgeon: Carole Civil, MD;  Location: [facility];  Service:  Orthopedics;  Laterality: Right;  . KNEE ARTHROSCOPY WITH MEDIAL MENISECTOMY Right 05/10/2012   Procedure: KNEE ARTHROSCOPY WITH MEDIAL MENISECTOMY;  Surgeon: Carole Civil, MD;  Location: [facility];  Service: Orthopedics;  Laterality: Right;     Home Medications    Prior to Admission medications   Medication Sig Start Date End Date Taking? Authorizing Provider  ALPRAZolam Duanne Moron) 1 MG tablet Take 1 mg by mouth at bedtime as needed for sleep.    Historical Provider, MD  guaiFENesin (MUCINEX) 600 MG 12 hr tablet Take 600 mg by mouth 2 (two) times daily.    Historical Provider, MD  HYDROcodone-acetaminophen (NORCO) 5-325 MG per tablet Take 1 tablet by mouth every 6 (six) hours as needed for pain. 09/03/12   Carole Civil, MD  ibuprofen (ADVIL,MOTRIN) 800 MG tablet Take 1 tablet (800 mg total) by mouth every 8 (eight) hours as needed. 06/24/13   Carole Civil, MD  lisinopril-hydrochlorothiazide (PRINZIDE,ZESTORETIC) 20-25 MG per tablet Take 1 tablet by mouth daily.    Historical Provider, MD  naproxen sodium (ANAPROX) 220 MG tablet Take 220 mg by mouth 2 (two) times daily with a meal.    Historical Provider, MD  promethazine (PHENERGAN) 12.5 MG tablet Take 1 tablet (12.5 mg total) by mouth every 6 (six) hours as needed for nausea. 05/10/12   Carole Civil, MD    Family History Family History  Problem Relation Age of Onset  . Atrial fibrillation Mother   .  COPD Father   . Mesothelioma Father   . Diabetes Father   . Cholecystitis Sister     Social History Social History  Substance Use Topics  . Smoking status: Never Smoker  . Smokeless tobacco: Not on file  . Alcohol use Yes     Comment: almost daily use of brandy or beer     Allergies   Patient has no known allergies.   Review of Systems Review of Systems ROS reviewed and all are negative for acute change except as noted in the HPI.  Physical Exam Updated Vital Signs BP (!) 185/121 (BP Location: Right Arm)    Pulse 105   Temp 98.3 F (36.8 C) (Oral)   Resp 18   SpO2 98%   Physical Exam  Constitutional: He is oriented to person, place, and time. Vital signs are normal. He appears well-developed and well-nourished.  HENT:  Head: Normocephalic.  Right Ear: Hearing normal.  Left Ear: Hearing normal.  Eyes: Conjunctivae and EOM are normal. Pupils are equal, round, and reactive to light.  Neck: Normal range of motion. Neck supple.  Cardiovascular: Normal rate, regular rhythm, normal heart sounds and intact distal pulses.   Pulmonary/Chest: Effort normal and breath sounds normal.  Musculoskeletal:  Decrease ROM of left shoulder due to pain. Pain on palpation of anterior aspect. NO appreciable deformities or dislocations. NVI. Motor/sensation intact.   Neurological: He is alert and oriented to person, place, and time.  Skin: Skin is warm and dry.  Psychiatric: He has a normal mood and affect. His speech is normal and behavior is normal. Thought content normal.  Nursing note and vitals reviewed.  ED Treatments / Results  Labs (all labs ordered are listed, but only abnormal results are displayed) Labs Reviewed - No data to display  EKG  EKG Interpretation None      Radiology Dg Shoulder Left  Result Date: 01/27/2016 CLINICAL DATA:  Left shoulder pain after a fall today landing on both hands. His left shoulder feels like it is on fire. No previous injury. EXAM: LEFT SHOULDER - 2+ VIEW COMPARISON:  None in PACs FINDINGS: The bones are subjectively adequately mineralized. The glenohumeral joint spaces reasonably well-maintained. The scapula and proximal humerus are intact. The subacromial subdeltoid space is preserved. There is mild narrowing of the glenohumeral joint. The observed portions of the left clavicle are normal. IMPRESSION: There is no acute or significant chronic bony abnormality of the left shoulder. Electronically Signed   By: David  Martinique M.D.   On: 01/27/2016 15:20     Procedures Procedures (including critical care time)  Medications Ordered in ED Medications - No data to display   Initial Impression / Assessment and Plan / ED Course  I have reviewed the triage vital signs and the nursing notes.  Pertinent labs & imaging results that were available during my care of the patient were reviewed by me and considered in my medical decision making (see chart for details).  Clinical Course    Final Clinical Impressions(s) / ED Diagnoses  I have reviewed and evaluated the relevant imaging studies.  I have reviewed the relevant previous healthcare records.I obtained HPI from historian.  ED Course:  Assessment: Pt is a 55yM who presents s/p fall on out stretched arm with left shoulder pain today at work. No head trauma/LOC. On exam, pt in NAD. Nontoxic/nonseptic appearing. VSS. Afebrile. Lungs CTA. Heart RRR. Left shoulder with decrease ROM due to pain. NVI. Motor/sensation intact. TTP along anterior aspect. Imaging without  acute fracture/dislocation. Given analgesia in ED. Likely rotator cuff strain vs tear. Given sling. Pt also hypertensive at triage. Notes noncompliance with BP meds due to the way it makes him feel. Does not restrict diet. Counseled on strict diet and follow up to PCP. Requests to use diet restriction vs meds for HTN. Educated on risk of increase BP and indications to return to ED. I have reviewed the [place] Controlled Substance Reporting System. Given Rx #5 percocet. Plan is to DC home with follow up to PCP. At time of discharge, Patient is in no acute distress. Vital Signs are stable. Patient is able to ambulate. Patient able to tolerate PO.   Disposition/Plan:  DC Home Additional Verbal discharge instructions given and discussed with patient.  Pt Instructed to f/u with PCP in the next week for evaluation and treatment of symptoms. Return precautions given Pt acknowledges and agrees with plan  Supervising Physician Francine Graven, DO   Final diagnoses:  Acute pain of left shoulder  Hypertension, unspecified type    New Prescriptions New Prescriptions   NAPROXEN (NAPROSYN) 500 MG TABLET    Take 1 tablet (500 mg total) by mouth 2 (two) times daily.   OXYCODONE-ACETAMINOPHEN (PERCOCET/ROXICET) 5-325 MG TABLET    Take 1 tablet by mouth every 6 (six) hours as needed for severe pain.        Shary Decamp, PA-C 01/27/16 [place], DO 01/29/16 2025

## 2016-03-07 ENCOUNTER — Other Ambulatory Visit: Payer: Self-pay | Admitting: Orthopedic Surgery

## 2016-03-07 DIAGNOSIS — M7502 Adhesive capsulitis of left shoulder: Secondary | ICD-10-CM

## 2016-03-17 ENCOUNTER — Ambulatory Visit
Admission: RE | Admit: 2016-03-17 | Discharge: 2016-03-17 | Disposition: A | Discharge status: Home / Self Care | Payer: Worker's Compensation | Source: Ambulatory Visit | Attending: Orthopedic Surgery | Admitting: Orthopedic Surgery

## 2016-03-17 ENCOUNTER — Other Ambulatory Visit: Payer: Self-pay

## 2016-03-17 DIAGNOSIS — M7502 Adhesive capsulitis of left shoulder: Secondary | ICD-10-CM

## 2016-07-06 ENCOUNTER — Encounter (HOSPITAL_COMMUNITY): Payer: Self-pay | Admitting: *Deleted

## 2016-07-06 ENCOUNTER — Encounter (HOSPITAL_COMMUNITY)
Admission: AD | Disposition: A | Discharge status: Home / Self Care | Payer: Self-pay | Source: Other Acute Inpatient Hospital | Attending: Cardiovascular Disease

## 2016-07-06 ENCOUNTER — Inpatient Hospital Stay (HOSPITAL_COMMUNITY)
Admission: AD | Admit: 2016-07-06 | Discharge: 2016-07-10 | DRG: 246 | Disposition: A | Discharge status: Home / Self Care | Payer: Self-pay | Source: Other Acute Inpatient Hospital | Attending: Cardiovascular Disease | Admitting: Cardiovascular Disease

## 2016-07-06 DIAGNOSIS — I4891 Unspecified atrial fibrillation: Secondary | ICD-10-CM | POA: Diagnosis present

## 2016-07-06 DIAGNOSIS — I5033 Acute on chronic diastolic (congestive) heart failure: Secondary | ICD-10-CM | POA: Diagnosis not present

## 2016-07-06 DIAGNOSIS — I25110 Atherosclerotic heart disease of native coronary artery with unstable angina pectoris: Secondary | ICD-10-CM | POA: Diagnosis present

## 2016-07-06 DIAGNOSIS — F419 Anxiety disorder, unspecified: Secondary | ICD-10-CM | POA: Diagnosis present

## 2016-07-06 DIAGNOSIS — I241 Dressler's syndrome: Secondary | ICD-10-CM | POA: Diagnosis present

## 2016-07-06 DIAGNOSIS — R0602 Shortness of breath: Secondary | ICD-10-CM

## 2016-07-06 DIAGNOSIS — R0989 Other specified symptoms and signs involving the circulatory and respiratory systems: Secondary | ICD-10-CM

## 2016-07-06 DIAGNOSIS — I200 Unstable angina: Secondary | ICD-10-CM | POA: Diagnosis present

## 2016-07-06 DIAGNOSIS — Z79899 Other long term (current) drug therapy: Secondary | ICD-10-CM

## 2016-07-06 DIAGNOSIS — I10 Essential (primary) hypertension: Secondary | ICD-10-CM | POA: Diagnosis present

## 2016-07-06 DIAGNOSIS — I214 Non-ST elevation (NSTEMI) myocardial infarction: Principal | ICD-10-CM

## 2016-07-06 DIAGNOSIS — I110 Hypertensive heart disease with heart failure: Secondary | ICD-10-CM | POA: Diagnosis present

## 2016-07-06 DIAGNOSIS — I309 Acute pericarditis, unspecified: Secondary | ICD-10-CM | POA: Diagnosis not present

## 2016-07-06 DIAGNOSIS — R0609 Other forms of dyspnea: Secondary | ICD-10-CM

## 2016-07-06 DIAGNOSIS — E785 Hyperlipidemia, unspecified: Secondary | ICD-10-CM | POA: Diagnosis present

## 2016-07-06 DIAGNOSIS — M1990 Unspecified osteoarthritis, unspecified site: Secondary | ICD-10-CM | POA: Diagnosis present

## 2016-07-06 DIAGNOSIS — I480 Paroxysmal atrial fibrillation: Secondary | ICD-10-CM | POA: Diagnosis present

## 2016-07-06 DIAGNOSIS — Z7982 Long term (current) use of aspirin: Secondary | ICD-10-CM

## 2016-07-06 DIAGNOSIS — I471 Supraventricular tachycardia: Secondary | ICD-10-CM | POA: Diagnosis not present

## 2016-07-06 DIAGNOSIS — I2510 Atherosclerotic heart disease of native coronary artery without angina pectoris: Secondary | ICD-10-CM | POA: Diagnosis present

## 2016-07-06 DIAGNOSIS — R079 Chest pain, unspecified: Secondary | ICD-10-CM | POA: Diagnosis present

## 2016-07-06 DIAGNOSIS — I491 Atrial premature depolarization: Secondary | ICD-10-CM | POA: Diagnosis present

## 2016-07-06 DIAGNOSIS — Z791 Long term (current) use of non-steroidal anti-inflammatories (NSAID): Secondary | ICD-10-CM

## 2016-07-06 DIAGNOSIS — R0600 Dyspnea, unspecified: Secondary | ICD-10-CM

## 2016-07-06 HISTORY — DX: Hyperlipidemia, unspecified: E78.5

## 2016-07-06 HISTORY — DX: Essential (primary) hypertension: I10

## 2016-07-06 HISTORY — DX: Dressler's syndrome: I24.1

## 2016-07-06 HISTORY — DX: Atrial premature depolarization: I49.1

## 2016-07-06 HISTORY — DX: Atherosclerotic heart disease of native coronary artery without angina pectoris: I25.10

## 2016-07-06 HISTORY — PX: CORONARY STENT INTERVENTION: CATH118234

## 2016-07-06 HISTORY — PX: LEFT HEART CATH AND CORONARY ANGIOGRAPHY: CATH118249

## 2016-07-06 HISTORY — DX: Paroxysmal atrial fibrillation: I48.0

## 2016-07-06 LAB — COMPREHENSIVE METABOLIC PANEL
ALT: 31 U/L (ref 17–63)
ANION GAP: 13 (ref 5–15)
AST: 44 U/L — ABNORMAL HIGH (ref 15–41)
Albumin: 3.5 g/dL (ref 3.5–5.0)
Alkaline Phosphatase: 52 U/L (ref 38–126)
BUN: 18 mg/dL (ref 6–20)
CHLORIDE: 105 mmol/L (ref 101–111)
CO2: 22 mmol/L (ref 22–32)
Calcium: 8.5 mg/dL — ABNORMAL LOW (ref 8.9–10.3)
Creatinine, Ser: 1.24 mg/dL (ref 0.61–1.24)
GFR calc Af Amer: >60 mL/min (ref >60)
Glucose, Bld: 100 mg/dL — ABNORMAL HIGH (ref 65–99)
POTASSIUM: 3.5 mmol/L (ref 3.5–5.1)
Sodium: 140 mmol/L (ref 135–145)
TOTAL PROTEIN: 6 g/dL — AB (ref 6.5–8.1)
Total Bilirubin: 0.3 mg/dL (ref 0.3–1.2)

## 2016-07-06 LAB — LIPID PANEL
CHOL/HDL RATIO: 8.3 ratio
CHOLESTEROL: 191 mg/dL (ref 0–200)
HDL: 23 mg/dL — AB (ref >40)
LDL Cholesterol: 105 mg/dL — ABNORMAL HIGH (ref 0–99)
TRIGLYCERIDES: 316 mg/dL — AB (ref <150)
VLDL: 63 mg/dL — ABNORMAL HIGH (ref 0–40)

## 2016-07-06 LAB — POCT ACTIVATED CLOTTING TIME: ACTIVATED CLOTTING TIME: 428 s

## 2016-07-06 LAB — TROPONIN I
TROPONIN I: 4.73 ng/mL — AB (ref <0.03)
TROPONIN I: 5.97 ng/mL — AB (ref <0.03)
TROPONIN I: 6.22 ng/mL — AB (ref <0.03)

## 2016-07-06 LAB — CBC
HEMATOCRIT: 37.4 % — AB (ref 39.0–52.0)
HEMOGLOBIN: 13 g/dL (ref 13.0–17.0)
MCH: 31.9 pg (ref 26.0–34.0)
MCHC: 34.8 g/dL (ref 30.0–36.0)
MCV: 91.9 fL (ref 78.0–100.0)
Platelets: 241 10*3/uL (ref 150–400)
RBC: 4.07 MIL/uL — AB (ref 4.22–5.81)
RDW: 13.6 % (ref 11.5–15.5)
WBC: 8.8 10*3/uL (ref 4.0–10.5)

## 2016-07-06 LAB — TSH: TSH: 10.311 u[IU]/mL — AB (ref 0.350–4.500)

## 2016-07-06 LAB — HIV ANTIBODY (ROUTINE TESTING W REFLEX): HIV SCREEN 4TH GENERATION: NONREACTIVE

## 2016-07-06 LAB — PROTIME-INR
INR: 1.08
Prothrombin Time: 14.1 seconds (ref 11.4–15.2)

## 2016-07-06 LAB — BRAIN NATRIURETIC PEPTIDE: B Natriuretic Peptide: 165.6 pg/mL — ABNORMAL HIGH (ref 0.0–100.0)

## 2016-07-06 LAB — MRSA PCR SCREENING: MRSA by PCR: NEGATIVE

## 2016-07-06 SURGERY — LEFT HEART CATH AND CORONARY ANGIOGRAPHY
Anesthesia: LOCAL

## 2016-07-06 MED ORDER — HEPARIN (PORCINE) IN NACL 2-0.9 UNIT/ML-% IJ SOLN
INTRAMUSCULAR | Status: DC | PRN
  Administered 2016-07-06 (×2): 1000 mL

## 2016-07-06 MED ORDER — ALPRAZOLAM 0.5 MG PO TABS: 1.0000 mg | ORAL_TABLET | Freq: Two times a day (BID) | ORAL | Status: DC | PRN

## 2016-07-06 MED ORDER — BIVALIRUDIN BOLUS VIA INFUSION - CUPID
INTRAVENOUS | Status: DC | PRN
  Administered 2016-07-06: 95.175 mg via INTRAVENOUS

## 2016-07-06 MED ORDER — LIDOCAINE HCL (PF) 1 % IJ SOLN
INTRAMUSCULAR | Status: DC | PRN
  Administered 2016-07-06: 2 mL via SUBCUTANEOUS

## 2016-07-06 MED ORDER — ATORVASTATIN CALCIUM 80 MG PO TABS
80.0000 mg | ORAL_TABLET | Freq: Every day | ORAL | Status: DC
  Administered 2016-07-06 – 2016-07-09 (×4): 80 mg via ORAL
  Filled 2016-07-06 (×4): qty 1

## 2016-07-06 MED ORDER — MIDAZOLAM HCL 2 MG/2ML IJ SOLN
INTRAMUSCULAR | Status: AC
  Filled 2016-07-06: qty 2

## 2016-07-06 MED ORDER — IOPAMIDOL (ISOVUE-370) INJECTION 76%
INTRAVENOUS | Status: AC
  Filled 2016-07-06: qty 100

## 2016-07-06 MED ORDER — TICAGRELOR 90 MG PO TABS
90.0000 mg | ORAL_TABLET | Freq: Two times a day (BID) | ORAL | Status: DC
  Administered 2016-07-07 – 2016-07-09 (×6): 90 mg via ORAL
  Filled 2016-07-06 (×6): qty 1

## 2016-07-06 MED ORDER — ASPIRIN 81 MG PO CHEW: 81.0000 mg | CHEWABLE_TABLET | ORAL | Status: AC

## 2016-07-06 MED ORDER — MIDAZOLAM HCL 2 MG/2ML IJ SOLN
INTRAMUSCULAR | Status: DC | PRN
  Administered 2016-07-06: 1 mg via INTRAVENOUS
  Administered 2016-07-06: 2 mg via INTRAVENOUS
  Administered 2016-07-06 (×2): 1 mg via INTRAVENOUS

## 2016-07-06 MED ORDER — TIROFIBAN HCL IN NACL 5-0.9 MG/100ML-% IV SOLN
INTRAVENOUS | Status: AC
  Filled 2016-07-06: qty 100

## 2016-07-06 MED ORDER — NITROGLYCERIN IN D5W 200-5 MCG/ML-% IV SOLN
INTRAVENOUS | Status: AC
  Filled 2016-07-06: qty 250

## 2016-07-06 MED ORDER — HEPARIN (PORCINE) IN NACL 2-0.9 UNIT/ML-% IJ SOLN
INTRAMUSCULAR | Status: DC | PRN
  Administered 2016-07-06: 10 mL via INTRA_ARTERIAL

## 2016-07-06 MED ORDER — HEPARIN (PORCINE) IN NACL 2-0.9 UNIT/ML-% IJ SOLN
INTRAMUSCULAR | Status: AC
  Filled 2016-07-06: qty 500

## 2016-07-06 MED ORDER — FENTANYL CITRATE (PF) 100 MCG/2ML IJ SOLN
INTRAMUSCULAR | Status: AC
  Filled 2016-07-06: qty 2

## 2016-07-06 MED ORDER — TIROFIBAN HCL IN NACL 5-0.9 MG/100ML-% IV SOLN
INTRAVENOUS | Status: DC | PRN
  Administered 2016-07-06 (×2): 0.15 ug/kg/min via INTRAVENOUS

## 2016-07-06 MED ORDER — VERAPAMIL HCL 2.5 MG/ML IV SOLN
INTRAVENOUS | Status: DC | PRN
  Administered 2016-07-06: 10 mL via INTRA_ARTERIAL

## 2016-07-06 MED ORDER — ALPRAZOLAM 0.5 MG PO TABS
1.0000 mg | ORAL_TABLET | Freq: Two times a day (BID) | ORAL | Status: DC | PRN
  Administered 2016-07-06 – 2016-07-09 (×7): 1 mg via ORAL
  Filled 2016-07-06 (×7): qty 2

## 2016-07-06 MED ORDER — SODIUM CHLORIDE 0.9 % IV SOLN
INTRAVENOUS | Status: DC | PRN
  Administered 2016-07-06 (×3): 1.75 mg/kg/h via INTRAVENOUS

## 2016-07-06 MED ORDER — ATORVASTATIN CALCIUM 80 MG PO TABS: 80.0000 mg | ORAL_TABLET | Freq: Every day | ORAL | Status: DC

## 2016-07-06 MED ORDER — HYDRALAZINE HCL 20 MG/ML IJ SOLN: 5.0000 mg | INTRAMUSCULAR | Status: AC | PRN

## 2016-07-06 MED ORDER — ASPIRIN 81 MG PO CHEW
81.0000 mg | CHEWABLE_TABLET | Freq: Every day | ORAL | Status: DC
  Administered 2016-07-07 – 2016-07-10 (×4): 81 mg via ORAL
  Filled 2016-07-06 (×4): qty 1

## 2016-07-06 MED ORDER — HEPARIN SODIUM (PORCINE) 1000 UNIT/ML IJ SOLN
INTRAMUSCULAR | Status: DC | PRN
  Administered 2016-07-06: 6000 [IU] via INTRAVENOUS

## 2016-07-06 MED ORDER — DILTIAZEM HCL-DEXTROSE 100-5 MG/100ML-% IV SOLN (PREMIX)
5.0000 mg/h | INTRAVENOUS | Status: DC
  Administered 2016-07-06: 5 mg/h via INTRAVENOUS
  Filled 2016-07-06: qty 100

## 2016-07-06 MED ORDER — VERAPAMIL HCL 2.5 MG/ML IV SOLN
INTRAVENOUS | Status: AC
  Filled 2016-07-06: qty 2

## 2016-07-06 MED ORDER — SODIUM CHLORIDE 0.9% FLUSH: 3.0000 mL | INTRAVENOUS | Status: DC | PRN

## 2016-07-06 MED ORDER — BIVALIRUDIN TRIFLUOROACETATE 250 MG IV SOLR
INTRAVENOUS | Status: AC
  Filled 2016-07-06: qty 250

## 2016-07-06 MED ORDER — ALPRAZOLAM 0.5 MG PO TABS: 1.0000 mg | ORAL_TABLET | Freq: Every evening | ORAL | Status: DC | PRN

## 2016-07-06 MED ORDER — OXYCODONE-ACETAMINOPHEN 5-325 MG PO TABS
1.0000 | ORAL_TABLET | Freq: Four times a day (QID) | ORAL | Status: DC | PRN
  Administered 2016-07-06 – 2016-07-09 (×6): 1 via ORAL
  Filled 2016-07-06 (×6): qty 1

## 2016-07-06 MED ORDER — SODIUM CHLORIDE 0.9 % IV SOLN: 250.0000 mL | INTRAVENOUS | Status: DC | PRN

## 2016-07-06 MED ORDER — ONDANSETRON HCL 4 MG/2ML IJ SOLN: 4.0000 mg | Freq: Four times a day (QID) | INTRAMUSCULAR | Status: DC | PRN

## 2016-07-06 MED ORDER — LABETALOL HCL 5 MG/ML IV SOLN: 10.0000 mg | INTRAVENOUS | Status: AC | PRN

## 2016-07-06 MED ORDER — LISINOPRIL 10 MG PO TABS
10.0000 mg | ORAL_TABLET | Freq: Every day | ORAL | Status: DC
  Administered 2016-07-06 – 2016-07-10 (×5): 10 mg via ORAL
  Filled 2016-07-06 (×5): qty 1

## 2016-07-06 MED ORDER — NITROGLYCERIN IN D5W 200-5 MCG/ML-% IV SOLN
INTRAVENOUS | Status: DC | PRN
  Administered 2016-07-06: 10 ug/min via INTRAVENOUS

## 2016-07-06 MED ORDER — LIDOCAINE HCL (PF) 1 % IJ SOLN
INTRAMUSCULAR | Status: AC
  Filled 2016-07-06: qty 30

## 2016-07-06 MED ORDER — SODIUM CHLORIDE 0.9 % WEIGHT BASED INFUSION: 3.0000 mL/kg/h | INTRAVENOUS | Status: DC

## 2016-07-06 MED ORDER — NITROGLYCERIN 1 MG/10 ML FOR IR/CATH LAB
INTRA_ARTERIAL | Status: DC | PRN
  Administered 2016-07-06 (×3): 200 ug via INTRACORONARY

## 2016-07-06 MED ORDER — DIAZEPAM 5 MG PO TABS
5.0000 mg | ORAL_TABLET | Freq: Four times a day (QID) | ORAL | Status: DC | PRN
  Administered 2016-07-08 – 2016-07-09 (×3): 5 mg via ORAL
  Filled 2016-07-06 (×4): qty 1

## 2016-07-06 MED ORDER — PROMETHAZINE HCL 25 MG PO TABS: 12.5000 mg | ORAL_TABLET | Freq: Four times a day (QID) | ORAL | Status: DC | PRN

## 2016-07-06 MED ORDER — MIDAZOLAM HCL 2 MG/2ML IJ SOLN
INTRAMUSCULAR | Status: AC
Start: 2016-07-06 — End: 2016-07-06
  Filled 2016-07-06: qty 2

## 2016-07-06 MED ORDER — FENTANYL CITRATE (PF) 100 MCG/2ML IJ SOLN
INTRAMUSCULAR | Status: DC | PRN
  Administered 2016-07-06 (×2): 25 ug via INTRAVENOUS
  Administered 2016-07-06 (×2): 50 ug via INTRAVENOUS
  Administered 2016-07-06 (×2): 25 ug via INTRAVENOUS

## 2016-07-06 MED ORDER — ACETAMINOPHEN 325 MG PO TABS
650.0000 mg | ORAL_TABLET | ORAL | Status: DC | PRN
  Administered 2016-07-08: 650 mg via ORAL
  Filled 2016-07-06: qty 2

## 2016-07-06 MED ORDER — NITROGLYCERIN 0.4 MG SL SUBL
0.4000 mg | SUBLINGUAL_TABLET | SUBLINGUAL | Status: DC | PRN
  Administered 2016-07-06 – 2016-07-07 (×3): 0.4 mg via SUBLINGUAL
  Filled 2016-07-06 (×2): qty 1

## 2016-07-06 MED ORDER — LISINOPRIL 20 MG PO TABS: 20.0000 mg | ORAL_TABLET | Freq: Every day | ORAL | Status: DC

## 2016-07-06 MED ORDER — ASPIRIN EC 81 MG PO TBEC
81.0000 mg | DELAYED_RELEASE_TABLET | Freq: Every day | ORAL | Status: DC
  Administered 2016-07-06: 81 mg via ORAL
  Filled 2016-07-06: qty 1

## 2016-07-06 MED ORDER — HEPARIN (PORCINE) IN NACL 2-0.9 UNIT/ML-% IJ SOLN
INTRAMUSCULAR | Status: AC
  Filled 2016-07-06: qty 1000

## 2016-07-06 MED ORDER — SODIUM CHLORIDE 0.9 % IV SOLN
INTRAVENOUS | Status: DC | PRN
  Administered 2016-07-06: 127 mL/h via INTRAVENOUS

## 2016-07-06 MED ORDER — IOPAMIDOL (ISOVUE-370) INJECTION 76%
INTRAVENOUS | Status: DC | PRN
  Administered 2016-07-06: 320 mL via INTRA_ARTERIAL

## 2016-07-06 MED ORDER — SODIUM CHLORIDE 0.9 % IV SOLN
INTRAVENOUS | Status: DC
  Administered 2016-07-06: 1000 mL via INTRAVENOUS

## 2016-07-06 MED ORDER — SODIUM CHLORIDE 0.9% FLUSH
3.0000 mL | Freq: Two times a day (BID) | INTRAVENOUS | Status: DC
  Administered 2016-07-07 – 2016-07-08 (×3): 3 mL via INTRAVENOUS

## 2016-07-06 MED ORDER — FUROSEMIDE 10 MG/ML IJ SOLN
20.0000 mg | Freq: Once | INTRAMUSCULAR | Status: AC
  Administered 2016-07-06: 20 mg via INTRAVENOUS
  Filled 2016-07-06: qty 2

## 2016-07-06 MED ORDER — HEPARIN SODIUM (PORCINE) 1000 UNIT/ML IJ SOLN
INTRAMUSCULAR | Status: AC
  Filled 2016-07-06: qty 1

## 2016-07-06 MED ORDER — TIROFIBAN HCL IN NACL 5-0.9 MG/100ML-% IV SOLN
0.1500 ug/kg/min | INTRAVENOUS | Status: AC
  Administered 2016-07-06 – 2016-07-07 (×4): 0.15 ug/kg/min via INTRAVENOUS
  Filled 2016-07-06 (×8): qty 100

## 2016-07-06 MED ORDER — LISINOPRIL-HYDROCHLOROTHIAZIDE 20-25 MG PO TABS: 1.0000 | ORAL_TABLET | Freq: Every day | ORAL | Status: DC

## 2016-07-06 MED ORDER — IOPAMIDOL (ISOVUE-370) INJECTION 76%
INTRAVENOUS | Status: AC
  Filled 2016-07-06: qty 50

## 2016-07-06 MED ORDER — HEPARIN (PORCINE) IN NACL 100-0.45 UNIT/ML-% IJ SOLN
1600.0000 [IU]/h | INTRAMUSCULAR | Status: DC
  Administered 2016-07-06: 1600 [IU]/h via INTRAVENOUS
  Filled 2016-07-06: qty 250

## 2016-07-06 MED ORDER — TICAGRELOR 90 MG PO TABS
ORAL_TABLET | ORAL | Status: AC
  Filled 2016-07-06: qty 2

## 2016-07-06 MED ORDER — NITROGLYCERIN 1 MG/10 ML FOR IR/CATH LAB
INTRA_ARTERIAL | Status: AC
  Filled 2016-07-06: qty 10

## 2016-07-06 MED ORDER — SODIUM CHLORIDE 0.9% FLUSH
3.0000 mL | Freq: Two times a day (BID) | INTRAVENOUS | Status: DC
  Administered 2016-07-06 – 2016-07-09 (×4): 3 mL via INTRAVENOUS

## 2016-07-06 MED ORDER — HYDROCHLOROTHIAZIDE 25 MG PO TABS: 25.0000 mg | ORAL_TABLET | Freq: Every day | ORAL | Status: DC

## 2016-07-06 MED ORDER — METOPROLOL SUCCINATE ER 25 MG PO TB24
25.0000 mg | ORAL_TABLET | Freq: Every day | ORAL | Status: DC
  Administered 2016-07-06 – 2016-07-07 (×2): 25 mg via ORAL
  Filled 2016-07-06 (×2): qty 1

## 2016-07-06 MED ORDER — TICAGRELOR 90 MG PO TABS
ORAL_TABLET | ORAL | Status: DC | PRN
  Administered 2016-07-06: 180 mg via ORAL

## 2016-07-06 MED ORDER — SODIUM CHLORIDE 0.9 % WEIGHT BASED INFUSION: 1.0000 mL/kg/h | INTRAVENOUS | Status: DC

## 2016-07-06 MED ORDER — SODIUM CHLORIDE 0.9% FLUSH: 3.0000 mL | Freq: Two times a day (BID) | INTRAVENOUS | Status: DC

## 2016-07-06 MED ORDER — TIROFIBAN (AGGRASTAT) BOLUS VIA INFUSION
INTRAVENOUS | Status: DC | PRN
  Administered 2016-07-06: 3172.5 ug via INTRAVENOUS

## 2016-07-06 SURGICAL SUPPLY — 32 items
BALLN EMERGE MR 2.5X15 (BALLOONS) ×2
BALLN MOZEC 2.0X12 (BALLOONS) ×2
BALLN MOZEC 3.0X38 (BALLOONS) ×2
BALLN ~~LOC~~ MOZEC 4.0X28 (BALLOONS) ×2
BALLN ~~LOC~~ MOZEC 4.0X8 (BALLOONS) ×2
BALLOON EMERGE MR 2.5X15 (BALLOONS) ×1 IMPLANT
BALLOON MOZEC 2.0X12 (BALLOONS) ×1 IMPLANT
BALLOON MOZEC 3.0X38 (BALLOONS) ×1 IMPLANT
BALLOON ~~LOC~~ MOZEC 4.0X28 (BALLOONS) ×1 IMPLANT
BALLOON ~~LOC~~ MOZEC 4.0X8 (BALLOONS) ×1 IMPLANT
CATH EXTRAC PRONTO LP 6F RND (CATHETERS) ×2 IMPLANT
CATH INFINITI 5FR ANG PIGTAIL (CATHETERS) ×2 IMPLANT
CATH INFINITI JR4 5F (CATHETERS) ×2 IMPLANT
CATH LAUNCHER 6FR AL.75 (CATHETERS) ×2 IMPLANT
CATH LAUNCHER 6FR HS (CATHETERS) ×2 IMPLANT
CATH OPTITORQUE TIG 4.0 5F (CATHETERS) ×2 IMPLANT
CATH VISTA GUIDE 6FR 3DRC (CATHETERS) ×2 IMPLANT
CATH VISTA GUIDE 6FR JR4 (CATHETERS) ×2 IMPLANT
CATH VISTA GUIDE 6FR XBRCA (CATHETERS) ×2 IMPLANT
DEVICE RAD COMP TR BAND LRG (VASCULAR PRODUCTS) ×2 IMPLANT
GLIDESHEATH SLEND SS 6F .021 (SHEATH) ×2 IMPLANT
GUIDEWIRE INQWIRE 1.5J.035X260 (WIRE) ×1 IMPLANT
INQWIRE 1.5J .035X260CM (WIRE) ×2
KIT ENCORE 26 ADVANTAGE (KITS) ×2 IMPLANT
KIT HEART LEFT (KITS) ×2 IMPLANT
PACK CARDIAC CATHETERIZATION (CUSTOM PROCEDURE TRAY) ×2 IMPLANT
STENT SYNERGY DES 3.5X38 (Permanent Stent) ×2 IMPLANT
SYR MEDRAD MARK V 150ML (SYRINGE) ×2 IMPLANT
TRANSDUCER W/STOPCOCK (MISCELLANEOUS) ×2 IMPLANT
TUBING CIL FLEX 10 FLL-RA (TUBING) ×2 IMPLANT
WIRE COUGAR XT STRL 190CM (WIRE) ×2 IMPLANT
WIRE PT2 LS 185 (WIRE) ×2 IMPLANT

## 2016-07-06 NOTE — Progress Notes (Signed)
ANTICOAGULATION CONSULT NOTE - Initial Consult  Pharmacy Consult for Angiomax Indication: Post PCI, continue tirofiban (AGGRASTAT) for 18 hours  No Known Allergies  Patient Measurements: Height: 6\' 1"  (185.4 cm) Weight: 279 lb 11.2 oz (126.9 kg) IBW/kg (Calculated) : 79.9  Vital Signs: BP: 136/89 (04/26 1638) Pulse Rate: 92 (04/26 1638)  Labs:  Recent Labs  07/06/16 0307 07/06/16 0849  HGB 13.0  --   HCT 37.4*  --   PLT 241  --   LABPROT 14.1  --   INR 1.08  --   CREATININE 1.24  --   TROPONINI 4.73* 5.97*    Estimated Creatinine Clearance: 95.1 mL/min (by C-G formula based on SCr of 1.24 mg/dL).   Medical History: Past Medical History:  Diagnosis Date  . Anxiety   . Diverticulosis    Assessment: Mr Chad Ellis is now s/p PCI. MD has asked pharmacy to continue Aggrastat for 18 hours post intervention. RN reports Angiomax bag is now empty and no bleeding or other issues to report.  Goal of Therapy:  Monitor platelets by anticoagulation protocol: Yes   Plan:  Aggrastat 0.15 mcg/kg/min x18 hours 6 hour CBC  Monitor for s/sx of bleeding  Yvonne Stopher L Jovan Schickling 07/06/2016,5:57 PM

## 2016-07-06 NOTE — Progress Notes (Signed)
ANTICOAGULATION CONSULT NOTE - Initial Consult  Pharmacy Consult for heparin Indication: chest pain/ACS and atrial fibrillation  No Known Allergies  Patient Measurements: Height: 6\' 1"  (185.4 cm) Weight: 279 lb 11.2 oz (126.9 kg) IBW/kg (Calculated) : 79.9 Heparin Dosing Weight: 110kg  Vital Signs: Temp: 98.2 F (36.8 C) (04/26 0245) Temp Source: Oral (04/26 0245) BP: 93/70 (04/26 0245) Pulse Rate: 118 (04/26 0245)   Medical History: Past Medical History:  Diagnosis Date  . Anxiety   . Diverticulosis     Assessment: 56yo male presented to OSH c/o CP/pressure, found to be in Afib w/ elevated troponin, to begin heparin.  Pt took 650mg  ASA at home.  Labs from OSH: SCr 1.23, K 3.5, trop 0.31, INR 1.0, PTT 24, WBC 10.1, Hgb 14.9, Plt 287  Home meds: Xanax, enalapril, HCTZ, ASA   Goal of Therapy:  Heparin level 0.3-0.7 units/ml Monitor platelets by anticoagulation protocol: Yes   Plan:  Rec'd Lovenox 127mg  at OSH @2109 ; at 0800 will start heparin gtt at 1600 units/hr and monitor heparin levels and CBC.  Wynona Neat, PharmD, BCPS  07/06/2016,3:27 AM

## 2016-07-06 NOTE — Progress Notes (Signed)
Paged on call MD [place] gtt and rate control. And Trioponin 4.73 Awaiting response.

## 2016-07-06 NOTE — Progress Notes (Signed)
Progress Note  Patient Name: Chad Ellis Date of Encounter: 07/06/2016  Primary Cardiologist: Reola Calkins (Ellis)  Subjective   No further chest pain. Just converted back into SR  Inpatient Medications    Scheduled Meds: . aspirin EC  81 mg Oral Daily  . atorvastatin  80 mg Oral q1800  . hydrochlorothiazide  25 mg Oral Daily  . lisinopril  20 mg Oral Daily  . sodium chloride flush  3 mL Intravenous Q12H   Continuous Infusions: . diltiazem (CARDIZEM) infusion 5 mg/hr (07/06/16 0758)  . heparin 1,600 Units/hr (07/06/16 0801)   PRN Meds: ALPRAZolam, oxyCODONE-acetaminophen, promethazine   Vital Signs    Vitals:   07/06/16 0245 07/06/16 0325 07/06/16 0450 07/06/16 0516  BP: 93/70 99/83 96/70  106/74  Pulse: (!) 118 (!) 104 100 (!) 123  Resp: 20 15 15  (!) 21  Temp: 98.2 F (36.8 C)   98.6 F (37 C)  TempSrc: Oral   Oral  SpO2: 98% 97% 100% 99%  Weight: 279 lb 11.2 oz (126.9 kg)     Height: 6\' 1"  (1.854 m)      No intake or output data in the 24 hours ending 07/06/16 0830 Filed Weights   07/06/16 0245  Weight: 279 lb 11.2 oz (126.9 kg)    Telemetry    AF now in SR - Personally Reviewed  ECG    SR - Personally Reviewed  Physical Exam   General: Well developed, well nourished, male appearing in no acute distress. Head: Normocephalic, atraumatic.  Neck: Supple without bruits, JVD. Lungs:  Resp regular and unlabored, CTA. Heart: RRR, S1, S2, no S3, S4, or murmur; no rub. Abdomen: Soft, non-tender, non-distended with normoactive bowel sounds. No hepatomegaly. No rebound/guarding. No obvious abdominal masses. Extremities: No clubbing, cyanosis, edema. Distal pedal pulses are 2+ bilaterally. Neuro: Alert and oriented X 3. Moves all extremities spontaneously. Psych: Normal affect.  Labs    Chemistry Recent Labs Lab 07/06/16 0307  NA 140  K 3.5  CL 105  CO2 22  GLUCOSE 100*  BUN 18  CREATININE 1.24  CALCIUM 8.5*  PROT 6.0*  ALBUMIN 3.5  AST 44*    ALT 31  ALKPHOS 52  BILITOT 0.3  GFRNONAA >60  GFRAA >60  ANIONGAP 13     Hematology Recent Labs Lab 07/06/16 0307  WBC 8.8  RBC 4.07*  HGB 13.0  HCT 37.4*  MCV 91.9  MCH 31.9  MCHC 34.8  RDW 13.6  PLT 241    Cardiac Enzymes Recent Labs Lab 07/06/16 0307  TROPONINI 4.73*   No results for input(s): TROPIPOC in the last 168 hours.   BNP Recent Labs Lab 07/06/16 0307  BNP 165.6*     DDimer No results for input(s): DDIMER in the last 168 hours.    Radiology    No results found.  Cardiac Studies   N/A  Patient Profile     56 y.o. male with PMH of HTN who presented with ongoing progressive dyspnea and chest pain to [facility]. Found to have AF, and elevated troponin and transferred to [facility] for further work up.   Assessment & Plan    1. AF: Reports family hx of the same. Has noticed some palpitations over the past couple of weeks. At times has felt that his heart is racing. Was placed on IV Dilt and heparin. Now back in SR at last check of telemetry -- At this time CHA2DS2-VASc Score and unadjusted Ischemic Stroke Rate (% per year) is  equal to 0.6 % stroke rate/year from a score of 1 for HTN. Could be higher based on clinical work up.  -- continue IV heparin for now, will need to determine whether [place] is needed -- TSH low, check free T4  2. NSTEMI: Reports chest pain last night, but has had exertional dyspnea over the past couple of weeks at work. At last check trop 4.73. No chest pain at this time. Plan for cardiac catheterization today -- The patient understands that risks included but are not limited to stroke (1 in 1000), death (1 in 60), kidney failure [usually temporary] (1 in 500), bleeding (1 in 200), allergic reaction [possibly serious] (1 in 200).  -- IV heparin -- add BB -- check Hgb A1c  3. HTN: Suppose to be on medications at home, but has not been taking 2/2 dizziness. -- stop HCTZ and reduce lisinopril  4. HL: started on high  dose statin, LDL 105, HDL 23  -- case management consult to assist with medication needs  Signed, Reino Bellis, NP  07/06/2016, 8:30 AM   Patient seen and examined and history reviewed. Agree with above findings and plan. Patient feels better this am. Notes some diarrhea today. Denies chest pain or SOB currently. Converted to NSR today. Notes he has been taking BP medication every other day due to dizziness.  On exam he is in NAD No JVD Lungs clear CV RRR without gallop or murmur No edema  Ecg NSR with no acute change  Impression:  1. Afib with RVR. Now converted to NSR. Given significant troponin elevation wonder if afib triggered by coronary ischemia. Will continue IV heparin. Stop IV Cardizem. Start low dose Toprol XL. Echo today. [place] vasc score of 1. If no clear coronary ischemia trigger may need to consider for long term anticoagulation  2. NSTEMI possibly demand ischemia in setting of Afib but degree of troponin elevation makes me more concerned that this is a primary ischemic event. Plan cardiac cath today. Discussed potential for stent. If so this would delay elective surgery for a minimum of 6 months due to need for DAPt.  3. HTN. BP now low. Stop Cardizem and HCTZ. Reduce lisinopril by half.   4. HLD on statin now.   5. Rotator cuff injury. Prior surgery in Nov. Planned repeat surgery due to worsening symptoms. 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   Chad Ellis, [place] 07/06/2016 9:54 AM

## 2016-07-06 NOTE — H&P (View-Only) (Signed)
Progress Note  Patient Name: Chad Ellis Date of Encounter: 07/06/2016  Primary Cardiologist: Reola Calkins ([place])  Subjective   No further chest pain. Just converted back into SR  Inpatient Medications    Scheduled Meds: . aspirin EC  81 mg Oral Daily  . atorvastatin  80 mg Oral q1800  . hydrochlorothiazide  25 mg Oral Daily  . lisinopril  20 mg Oral Daily  . sodium chloride flush  3 mL Intravenous Q12H   Continuous Infusions: . diltiazem (CARDIZEM) infusion 5 mg/hr (07/06/16 0758)  . heparin 1,600 Units/hr (07/06/16 0801)   PRN Meds: ALPRAZolam, oxyCODONE-acetaminophen, promethazine   Vital Signs    Vitals:   07/06/16 0245 07/06/16 0325 07/06/16 0450 07/06/16 0516  BP: 93/70 99/83 96/70  106/74  Pulse: (!) 118 (!) 104 100 (!) 123  Resp: 20 15 15  (!) 21  Temp: 98.2 F (36.8 C)   98.6 F (37 C)  TempSrc: Oral   Oral  SpO2: 98% 97% 100% 99%  Weight: 279 lb 11.2 oz (126.9 kg)     Height: 6\' 1"  (1.854 m)      No intake or output data in the 24 hours ending 07/06/16 0830 Filed Weights   07/06/16 0245  Weight: 279 lb 11.2 oz (126.9 kg)    Telemetry    AF now in SR - Personally Reviewed  ECG    SR - Personally Reviewed  Physical Exam   General: Well developed, well nourished, male appearing in no acute distress. Head: Normocephalic, atraumatic.  Neck: Supple without bruits, JVD. Lungs:  Resp regular and unlabored, CTA. Heart: RRR, S1, S2, no S3, S4, or murmur; no rub. Abdomen: Soft, non-tender, non-distended with normoactive bowel sounds. No hepatomegaly. No rebound/guarding. No obvious abdominal masses. Extremities: No clubbing, cyanosis, edema. Distal pedal pulses are 2+ bilaterally. Neuro: Alert and oriented X 3. Moves all extremities spontaneously. Psych: Normal affect.  Labs    Chemistry Recent Labs Lab 07/06/16 0307  NA 140  K 3.5  CL 105  CO2 22  GLUCOSE 100*  BUN 18  CREATININE 1.24  CALCIUM 8.5*  PROT 6.0*  ALBUMIN 3.5  AST 44*    ALT 31  ALKPHOS 52  BILITOT 0.3  GFRNONAA >60  GFRAA >60  ANIONGAP 13     Hematology Recent Labs Lab 07/06/16 0307  WBC 8.8  RBC 4.07*  HGB 13.0  HCT 37.4*  MCV 91.9  MCH 31.9  MCHC 34.8  RDW 13.6  PLT 241    Cardiac Enzymes Recent Labs Lab 07/06/16 0307  TROPONINI 4.73*   No results for input(s): TROPIPOC in the last 168 hours.   BNP Recent Labs Lab 07/06/16 0307  BNP 165.6*     DDimer No results for input(s): DDIMER in the last 168 hours.    Radiology    No results found.  Cardiac Studies   N/A  Patient Profile     56 y.o. male with PMH of HTN who presented with ongoing progressive dyspnea and chest pain to [facility]. Found to have AF, and elevated troponin and transferred to [facility] for further work up.   Assessment & Plan    1. AF: Reports family hx of the same. Has noticed some palpitations over the past couple of weeks. At times has felt that his heart is racing. Was placed on IV Dilt and heparin. Now back in SR at last check of telemetry -- At this time CHA2DS2-VASc Score and unadjusted Ischemic Stroke Rate (% per year) is  equal to 0.6 % stroke rate/year from a score of 1 for HTN. Could be higher based on clinical work up.  -- continue IV heparin for now, will need to determine whether [place] is needed -- TSH low, check free T4  2. NSTEMI: Reports chest pain last night, but has had exertional dyspnea over the past couple of weeks at work. At last check trop 4.73. No chest pain at this time. Plan for cardiac catheterization today -- The patient understands that risks included but are not limited to stroke (1 in 1000), death (1 in 33), kidney failure [usually temporary] (1 in 500), bleeding (1 in 200), allergic reaction [possibly serious] (1 in 200).  -- IV heparin -- add BB -- check Hgb A1c  3. HTN: Suppose to be on medications at home, but has not been taking 2/2 dizziness. -- stop HCTZ and reduce lisinopril  4. HL: started on high  dose statin, LDL 105, HDL 23  -- case management consult to assist with medication needs  Signed, Reino Bellis, NP  07/06/2016, 8:30 AM   Patient seen and examined and history reviewed. Agree with above findings and plan. Patient feels better this am. Notes some diarrhea today. Denies chest pain or SOB currently. Converted to NSR today. Notes he has been taking BP medication every other day due to dizziness.  On exam he is in NAD No JVD Lungs clear CV RRR without gallop or murmur No edema  Ecg NSR with no acute change  Impression:  1. Afib with RVR. Now converted to NSR. Given significant troponin elevation wonder if afib triggered by coronary ischemia. Will continue IV heparin. Stop IV Cardizem. Start low dose Toprol XL. Echo today. [place] vasc score of 1. If no clear coronary ischemia trigger may need to consider for long term anticoagulation  2. NSTEMI possibly demand ischemia in setting of Afib but degree of troponin elevation makes me more concerned that this is a primary ischemic event. Plan cardiac cath today. Discussed potential for stent. If so this would delay elective surgery for a minimum of 6 months due to need for DAPt.  3. HTN. BP now low. Stop Cardizem and HCTZ. Reduce lisinopril by half.   4. HLD on statin now.   5. Rotator cuff injury. Prior surgery in Nov. Planned repeat surgery due to worsening symptoms. 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   Peter [place], [place] 07/06/2016 9:54 AM

## 2016-07-06 NOTE — Interval H&P Note (Signed)
History and Physical Interval Note:  07/06/2016 1:24 PM  Chad Ellis  has presented today for surgery, with the diagnosis of cp  The various methods of treatment have been discussed with the patient and family. After consideration of risks, benefits and other options for treatment, the patient has consented to  Procedure(s): Left Heart Cath and Coronary Angiography (N/A) as a surgical intervention .  The patient's history has been reviewed, patient examined, no change in status, stable for surgery.  I have reviewed the patient's chart and labs.  Questions were answered to the patient's satisfaction.     Shelva Majestic

## 2016-07-06 NOTE — Progress Notes (Signed)
Second Call made to Dr. Eula Fried regarding cardizem gtt and Troponins. Still awaiting response.

## 2016-07-06 NOTE — H&P (Signed)
CARDIOLOGY OBSERVATION HISTORY AND PHYSICAL EXAMINATION NOTE  Patient ID: Chad Ellis MRN: [id number], DOB/AGE: 1960-09-24   Admit date: 07/06/2016   Primary Physician: Monico Blitz, MD Primary Cardiologist: NEW  Reason for admission: CHEST PAIN   HPI: This is a 56 y.o. male with no prior history of CAD but has history of  Osteoarthritis and has been on long term NSAIDs. Patient was planned to have rotator cuff surgery tomorrow.  Patient today presented to [facility] with 1 week history of SOB and orthopnea. He started having CP yesterday around 4 pm on and off associated with palpitation. It would relieve with burping so he took alketzer which improved it but then he started hurting again so he went to the ED. His friend's wife who is a Therapist, sports told him to go to the ED since she felt that he is having irregular HR. In the ED, he was found to have elevated troponin of 0.31 and ECG consistent with atrial fibrillation. He was not rate controlled on IV cardiazem and was sent to [facility] for further care.   Problem List: Past Medical History:  Diagnosis Date  . Anxiety   . Diverticulosis     Past Surgical History:  Procedure Laterality Date  . CHONDROPLASTY Right 05/10/2012   Procedure: CHONDROPLASTY;  Surgeon: Carole Civil, MD;  Location: [facility];  Service: Orthopedics;  Laterality: Right;  of patella  . COLONOSCOPY    . ESOPHAGOGASTRODUODENOSCOPY    . EXAM UNDER ANESTHESIA WITH MANIPULATION OF KNEE Right 05/10/2012   Procedure: EXAM UNDER ANESTHESIA WITH MANIPULATION OF KNEE;  Surgeon: Carole Civil, MD;  Location: [facility];  Service: Orthopedics;  Laterality: Right;  start 0750 end 751  . KNEE ARTHROSCOPY WITH EXCISION PLICA Right 6/96/2952   Procedure: KNEE ARTHROSCOPY WITH EXCISION PLICA;  Surgeon: Carole Civil, MD;  Location: [facility];  Service: Orthopedics;  Laterality: Right;  . KNEE ARTHROSCOPY WITH MEDIAL MENISECTOMY Right 05/10/2012   Procedure: KNEE ARTHROSCOPY  WITH MEDIAL MENISECTOMY;  Surgeon: Carole Civil, MD;  Location: [facility];  Service: Orthopedics;  Laterality: Right;     Allergies: No Known Allergies   Home Medications Current Facility-Administered Medications  Medication Dose Route Frequency Provider Last Rate Last Dose  . ALPRAZolam (XANAX) tablet 1 mg  1 mg Oral QHS PRN Flossie Dibble, MD      . aspirin EC tablet 81 mg  81 mg Oral Daily Flossie Dibble, MD      . atorvastatin (LIPITOR) tablet 80 mg  80 mg Oral q1800 Flossie Dibble, MD      . lisinopril-hydrochlorothiazide (PRINZIDE,ZESTORETIC) 20-25 MG per tablet 1 tablet  1 tablet Oral Daily Flossie Dibble, MD      . oxyCODONE-acetaminophen (PERCOCET/ROXICET) 5-325 MG per tablet 1 tablet  1 tablet Oral Q6H PRN Flossie Dibble, MD      . promethazine (PHENERGAN) tablet 12.5 mg  12.5 mg Oral Q6H PRN Flossie Dibble, MD      . sodium chloride flush (NS) 0.9 % injection 3 mL  3 mL Intravenous Q12H Flossie Dibble, MD         Family History  Problem Relation Age of Onset  . Atrial fibrillation Mother   . COPD Father   . Mesothelioma Father   . Diabetes Father   . Cholecystitis Sister      Social History   Social History  . Marital status: Divorced    Spouse name: N/A  . Number of  children: N/A  . Years of education: N/A   Occupational History  . Not on file.   Social History Main Topics  . Smoking status: Never Smoker  . Smokeless tobacco: Not on file  . Alcohol use Yes     Comment: almost daily use of brandy or beer  . Drug use: No  . Sexual activity: Not on file   Other Topics Concern  . Not on file   Social History Narrative  . No narrative on file     Review of Systems: General: unintentional gain in weight. 25 lbs since last 5 months.  Cardiovascular: chest pain, dyspnea on exertion, palpitations, orthopnea but denied leg swelling Dermatological: negative for rash Respiratory: negative for cough or wheezing Urologic: negative for  hematuria Abdominal: diarrhea  Neurologic: numbness in the left arm since his fall recently  Endocrine: no diabetes, no hypothyroidism Immunological: no lymph adenopathy Psych: non homicidal/suicidal  Physical Exam: Vitals: BP 93/70 (BP Location: Right Arm)   SpO2 98%  General: not in acute distress Neck: JVP flat, neck supple Heart: irregular rate and rhythm, S1, S2, no murmurs  Lungs: CTAB  GI: non tender, non distended, bowel sounds present Extremities: no edema Neuro: AAO x 3  Psych: normal affect, no anxiety   Labs:  No results found for this or any previous visit (from the past 24 hour(s)).   Radiology/Studies: No results found.  EKG: atrial fibrillation with RVR  Echo: pending  Cardiac cath: pending  Medical decision making:  Discussed care with the patient Discussed care with the physician on the phone Reviewed labs and imaging personally Reviewed prior records  ASSESSMENT AND PLAN:  This is a 56 y.o. male with no prior history of CAD presented CHF symptoms, elevated troponin, and new onset atrial fibrillation.    Active Problems:   Unstable angina (HCC)  Unstable angina  increasing episodes of chest pain, recent at rest pain  - admit to telemetry floor  - recommend IV heparin - cycle troponin - echocardiogram in the AM - NPO post midnight - CBC, CMP, INR/PTT - TSH, HbA1c, lipid panel - consider cardiac cath   New onset atrial fibrillation with RVR, likely Non valvular atrial fibrillation, likely trigger is mild heart failure, symptomatic  Prior history of CHF and hypertension (CHADS2VASC = 2) on lasix  Started on IV heparin, for possible TEE/DCCV or just DCCV as patient was on anticoagulation for sometime Will keep patient NPO post midnight Checking TSH, electrolytes Keep potassium >4, calcium >9, magnesium >2 Repeat echocardiogram to evaluate for LVEF/tachycardia induced cardiomyoapthy Aspirin for secondary prevention of coronary artery disease    Possible acute on chronic diastolic heart failure, will check BNP, given 1 dose of IV lasix 20 mg, strict I/Os, daily weights, echocardiogram ordered  Hypertension, diet controlled  Signed, Flossie Dibble, MD MS 07/06/2016, 3:03 AM

## 2016-07-07 ENCOUNTER — Encounter (HOSPITAL_COMMUNITY): Payer: Self-pay | Admitting: Cardiovascular Disease

## 2016-07-07 LAB — BASIC METABOLIC PANEL
Anion gap: 9 (ref 5–15)
BUN: 12 mg/dL (ref 6–20)
CHLORIDE: 106 mmol/L (ref 101–111)
CO2: 26 mmol/L (ref 22–32)
Calcium: 8.4 mg/dL — ABNORMAL LOW (ref 8.9–10.3)
Creatinine, Ser: 1 mg/dL (ref 0.61–1.24)
GFR calc Af Amer: >60 mL/min (ref >60)
GFR calc non Af Amer: >60 mL/min (ref >60)
GLUCOSE: 108 mg/dL — AB (ref 65–99)
POTASSIUM: 3.6 mmol/L (ref 3.5–5.1)
Sodium: 141 mmol/L (ref 135–145)

## 2016-07-07 LAB — CBC
HEMATOCRIT: 35.1 % — AB (ref 39.0–52.0)
Hemoglobin: 11.7 g/dL — ABNORMAL LOW (ref 13.0–17.0)
MCH: 31.2 pg (ref 26.0–34.0)
MCHC: 33.3 g/dL (ref 30.0–36.0)
MCV: 93.6 fL (ref 78.0–100.0)
Platelets: 204 10*3/uL (ref 150–400)
RBC: 3.75 MIL/uL — ABNORMAL LOW (ref 4.22–5.81)
RDW: 13.9 % (ref 11.5–15.5)
WBC: 6.3 10*3/uL (ref 4.0–10.5)

## 2016-07-07 LAB — HEMOGLOBIN A1C
HEMOGLOBIN A1C: 5.4 % (ref 4.8–5.6)
MEAN PLASMA GLUCOSE: 108 mg/dL

## 2016-07-07 LAB — GLUCOSE, CAPILLARY: Glucose-Capillary: 114 mg/dL — ABNORMAL HIGH (ref 65–99)

## 2016-07-07 LAB — T4, FREE: Free T4: 0.89 ng/dL (ref 0.61–1.12)

## 2016-07-07 MED ORDER — ALUM & MAG HYDROXIDE-SIMETH 200-200-20 MG/5ML PO SUSP
30.0000 mL | ORAL | Status: DC | PRN
  Administered 2016-07-07 – 2016-07-09 (×2): 30 mL via ORAL
  Filled 2016-07-07 (×2): qty 30

## 2016-07-07 MED ORDER — ANGIOPLASTY BOOK
Freq: Once | Status: AC
  Administered 2016-07-07: 04:00:00 1
  Filled 2016-07-07: qty 1

## 2016-07-07 MED ORDER — MORPHINE SULFATE (PF) 2 MG/ML IV SOLN
2.0000 mg | Freq: Once | INTRAVENOUS | Status: AC
  Administered 2016-07-07: 2 mg via INTRAVENOUS
  Filled 2016-07-07: qty 1

## 2016-07-07 MED ORDER — ISOSORBIDE MONONITRATE ER 30 MG PO TB24
30.0000 mg | ORAL_TABLET | Freq: Every day | ORAL | Status: DC
  Administered 2016-07-07 – 2016-07-10 (×4): 30 mg via ORAL
  Filled 2016-07-07 (×4): qty 1

## 2016-07-07 MED FILL — Morphine Sulfate Inj 10 MG/ML: INTRAMUSCULAR | Qty: 1 | Status: AC

## 2016-07-07 NOTE — Progress Notes (Signed)
TR BAND REMOVAL  LOCATION:    Right Radial  DEFLATED PER PROTOCOL:    Yes  TIME BAND OFF / DRESSING APPLIED:  0000  SITE UPON ARRIVAL:    Level  0  SITE AFTER BAND REMOVAL:  0-  CIRCULATION SENSATION AND MOVEMENT:    Within Normal Limits   Yes  COMMENTS:   Pt is on Aggrastat. Pt oozed twice during deflation. VS remained stable.Pt tolerated the procedure well.

## 2016-07-07 NOTE — Progress Notes (Signed)
CARDIAC REHAB PHASE I   PRE:  Rate/Rhythm: 101 ST PACs  BP:  Supine: 147/99  Sitting:   Standing:    SaO2: 97% .5L  MODE:  Ambulation: 400 ft   POST:  Rate/Rhythm: 112 ST PACs  BP:  Supine: 144/93  Sitting:   Standing:    SaO2: 98%RA 0805-0915 Pt walked 400 ft on RA with steady gait. He was having chest pressure 2-3 on scale of 1 to 10 that did not change with activity. Pt with some dyspneic after walk but he admitted he has gained weight since his injury and is deconditioned. Sats were Ellis on RA. Back to bed. Began MI education. Reviewed stent and need for brilinta. Needs to see case manager as he does not have insurance. Discussed risk factors, NTG use and heart healthy food choices and MI restrictions. Also gave pt OFF THE BEAT booklet since he is new to afib. Will finish ex ed and CRP 2 referral tomorrow as pt received many visitors who wanted to visit with him.   Chad Good, RN BSN  07/07/2016 9:10 AM

## 2016-07-07 NOTE — Progress Notes (Signed)
Progress Note  Patient Name: Chad Ellis Date of Encounter: 07/07/2016  Primary Cardiologist: Reola Calkins ([place])  Subjective   Feeling better this morning. Did have an episode of chest pain this morning that woke him from sleep, relieved with morphine.   Inpatient Medications    Scheduled Meds: . aspirin  81 mg Oral Daily  . atorvastatin  80 mg Oral q1800  . lisinopril  10 mg Oral Daily  . metoprolol succinate  25 mg Oral Daily  . sodium chloride flush  3 mL Intravenous Q12H  . sodium chloride flush  3 mL Intravenous Q12H  . ticagrelor  90 mg Oral BID   Continuous Infusions: . sodium chloride 500 mL (07/07/16 0445)  . sodium chloride    . tirofiban 0.15 mcg/kg/min (07/07/16 0509)   PRN Meds: sodium chloride, acetaminophen, ALPRAZolam, alum & mag hydroxide-simeth, diazepam, nitroGLYCERIN, ondansetron (ZOFRAN) IV, oxyCODONE-acetaminophen, promethazine, sodium chloride flush   Vital Signs    Vitals:   07/07/16 0458 07/07/16 0520 07/07/16 0540 07/07/16 0730  BP: 130/83  (!) 156/78 (!) 159/96  Pulse: 94 92 96 98  Resp: 11 10 20 20   Temp:    98.6 F (37 C)  TempSrc:    Oral  SpO2: 99% 99% 98% 96%  Weight:      Height:        Intake/Output Summary (Last 24 hours) at 07/07/16 0812 Last data filed at 07/07/16 0700  Gross per 24 hour  Intake           2265.9 ml  Output              950 ml  Net           1315.9 ml   Filed Weights   07/06/16 0245 07/07/16 0400  Weight: 279 lb 11.2 oz (126.9 kg) 285 lb 7.9 oz (129.5 kg)    Telemetry    SR with PACs, PVCs, episode of SVT - Personally Reviewed  ECG    Junctional Tachycardia? PVCs, PACs - Personally Reviewed  Physical Exam   General: Well developed, well nourished, male appearing in no acute distress. Head: Normocephalic, atraumatic.  Neck: Supple without bruits, JVD. Lungs:  Resp regular and unlabored, CTA. Heart: RRR, S1, S2, no S3, S4, or murmur; no rub. Abdomen: Soft, non-tender, non-distended with  normoactive bowel sounds. No hepatomegaly. No rebound/guarding. No obvious abdominal masses. Right radial cath site stable without hematoma Extremities: No clubbing, cyanosis, edema. Distal pedal pulses are 2+ bilaterally. Neuro: Alert and oriented X 3. Moves all extremities spontaneously. Psych: Normal affect.  Labs    Chemistry Recent Labs Lab 07/06/16 0307 07/07/16 0002  NA 140 141  K 3.5 3.6  CL 105 106  CO2 22 26  GLUCOSE 100* 108*  BUN 18 12  CREATININE 1.24 1.00  CALCIUM 8.5* 8.4*  PROT 6.0*  --   ALBUMIN 3.5  --   AST 44*  --   ALT 31  --   ALKPHOS 52  --   BILITOT 0.3  --   GFRNONAA >60 >60  GFRAA >60 >60  ANIONGAP 13 9     Hematology Recent Labs Lab 07/06/16 0307 07/07/16 0002  WBC 8.8 6.3  RBC 4.07* 3.75*  HGB 13.0 11.7*  HCT 37.4* 35.1*  MCV 91.9 93.6  MCH 31.9 31.2  MCHC 34.8 33.3  RDW 13.6 13.9  PLT 241 204    Cardiac Enzymes Recent Labs Lab 07/06/16 0307 07/06/16 0849 07/06/16 1824  TROPONINI 4.73* 5.97* 6.22*  No results for input(s): TROPIPOC in the last 168 hours.   BNP Recent Labs Lab 07/06/16 0307  BNP 165.6*     DDimer No results for input(s): DDIMER in the last 168 hours.    Radiology    No results found.  Cardiac Studies   LHC: 07/06/16  Conclusion     1st Mrg lesion, 20 %stenosed.  Mid LAD lesion, 20 %stenosed.  A STENT SYNERGY DES 3.5X38 drug eluting stent was successfully placed.  Prox RCA to Mid RCA lesion, 100 %stenosed.  Post intervention, there is a 0% residual stenosis.  The left ventricular ejection fraction is 45-50% by visual estimate.  There is mild left ventricular systolic dysfunction.   Acute/subacute very proximal RCA occlusion with extensive thrombus burden and TIMI 0 flow.  Mild nonobstructive concomitant CAD with 20% LAD stenosis and 20% circumflex narrowing and a large left circumflex vessel.  There was a hint of very minimal if any distal collateralization of the PLA vessel from  the RCA via the left injection.  Difficult but successful PCI to the totally occluded RCA with extensive thrombus requiring thrombectomy, PTCA, and ultimate DES stenting with a 3.538 mm Synergy DES stent postdilated to 4.14 mm very proximally and 3.9-4.0 mm distally in a very large RCA that supplies the PDA and PLA vessel.  RECOMMENDATION: Dual antiplatelet therapy.  The patient will continue on Aggrastat for 18 hours post procedure.  The patient will be started on high potency statin therapy, and medical therapy post MI including ACE-I/ARB, and beta blockerRx.   Patient Profile     56 y.o. male with PMH of HTN who presented with ongoing progressive dyspnea and chest pain to [place] to have AF, and elevated troponin and transferred to [facility] for further work up. Underwent cardiac cath 07/06/16.  Assessment & Plan    1. AF RVR: Reports family hx of the same. Has noticed some palpitations over the past couple of weeks. At times has felt that his heart is racing. Was placed on IV Dilt and heparin. Converted to SR 07/06/16 am. Telemetry appears to be in SR with PACs, PVCs, possibly junctional at times. One episode of SVT last evening -- At this time CHA2DS2-VASc Score and unadjusted Ischemic Stroke Rate (% per year) is equal to 0.6 % stroke rate/year from a score of 2 for HTN, MI.  -- TSH high, T4 normal  2. NSTEMI: Underwent cardiac cath yesterday with 100% occlusion in the RCA treated with thrombectomy and DES x1. Continued on aggrastat for 18 hours. Hgb A1c 5.4. Had episode of chest pain this morning. Still having mild pressure.  -- Will add Imdur 30mg  this morning. -- Now on BB, ACEi, statin, Brilinta and ASA  3. HTN: Hypertensive this morning. Medications noted above.   4. HL: started on high dose statin, LDL 105, HDL 23  Signed, Reino Bellis, NP  07/07/2016, 8:12 AM   Patient seen and examined and history reviewed. Agree with above findings and plan. Patient did  experience chest pain at 4 am. Felt need to belch. Relieved with Maalox and MSo4. Telemetry reviewed. Some PVCs. Looks like periods of ectopic atrial rhythm. No Afib. On exam- no JVD Lungs are clear CV RRR no murmur or gallop Radial site OK no hematoma  Ecg shows NSR with inferior infarct. No acute change.   I think Afib related to ACS with acute occlusion of RCA. Successful stenting of RCA with DES. Large thrombus burden. Will complete Aggrastat infusion today. Continue  DAPT with ASA and Brilinta. I would not recommend long term anticoagulation at this point. Will Check Echo today. Other therapy as noted. Will ambulate today and monitor on telemetry. If stable will aim for DC tomorrow am.  Jessilyn Catino [place], [place] 07/07/2016 9:30 AM

## 2016-07-07 NOTE — Care Management Note (Signed)
Case Management Note  Patient Details  Name: Chad Ellis MRN: [id number] Date of Birth: 1960/07/06  Subjective/Objective:  Pt admitted with dyspnea and CP - now s/p cath                  Action/Plan:   PTA from home independent.  Pt confirmed that he does not have insurance nor PCP - pt interested in both [facility] free clinics and [facility] - CM provided information on both.  CM also provided pt with free 30 day Brilinta card and urged pt to call Brilinta assistance number and request medication cost assistance while here in hospital. CM informed pt that it is imperative for him to establish PCP relationship to secure medication assistance - CM was unable to schedule appt with [facility]   Expected Discharge Date:                  Expected Discharge Plan:  Home/Self Care  In-House Referral:     Discharge planning Services  CM Consult, Medication Assistance, [facility]  Post Acute Care Choice:    Choice offered to:     DME Arranged:    DME Agency:     HH Arranged:    HH Agency:     Status of Service:  In process, will continue to follow  If discussed at Long Length of Stay Meetings, dates discussed:    Additional Comments:  Maryclare Labrador, RN 07/07/2016, 10:12 AM

## 2016-07-07 NOTE — Progress Notes (Addendum)
Pt awakened with c/o  MSCP 6/10. No radiation at that time. EKG obtained. Nasal O 2 applied at 2 L and NTG 1/150 administered SL.X 2.  Pt felt that" It could be indigestion." Maalox 30 cc's administered. Despite some relief with belching, pain did not subside. 4:30 CP improved slightly for a short time but became more severe 9/10 with radiation down L arm. Dr Radford Pax was notified and Morphine 2mg  was administered as ordered. 6:00 Pt asleep VSS.

## 2016-07-08 ENCOUNTER — Inpatient Hospital Stay (HOSPITAL_COMMUNITY): Payer: Self-pay

## 2016-07-08 ENCOUNTER — Encounter (HOSPITAL_COMMUNITY): Payer: Self-pay | Admitting: Radiology

## 2016-07-08 DIAGNOSIS — I4891 Unspecified atrial fibrillation: Secondary | ICD-10-CM

## 2016-07-08 LAB — ECHOCARDIOGRAM COMPLETE
E/e' ratio: 6.44
EWDT: 180 ms
FS: 31 % (ref 28–44)
Height: 73 in
IVS/LV PW RATIO, ED: 0.74
LA diam index: 1.63 cm/m2
LA vol index: 25.9 mL/m2
LA vol: 68.1 mL
LASIZE: 43 mm
LAVOLA4C: 71.8 mL
LDCA: 4.52 cm2
LEFT ATRIUM END SYS DIAM: 43 mm
LV E/e'average: 6.44
LV PW d: 13.8 mm — AB (ref 0.6–1.1)
LV TDI E'LATERAL: 9.57
LV e' LATERAL: 9.57 cm/s
LVEEMED: 6.44
LVOT VTI: 20.2 cm
LVOTPV: 93.1 cm/s
LVOTSV: 91 mL
Lateral S' vel: 11.6 cm/s
MV Dec: 180
MV pk E vel: 61.6 m/s
MVPKAVEL: 74.2 m/s
TDI e' medial: 9.45
Weight: 4567.93 oz

## 2016-07-08 LAB — CBC
HEMATOCRIT: 33.6 % — AB (ref 39.0–52.0)
HEMOGLOBIN: 11.1 g/dL — AB (ref 13.0–17.0)
MCH: 31.2 pg (ref 26.0–34.0)
MCHC: 33 g/dL (ref 30.0–36.0)
MCV: 94.4 fL (ref 78.0–100.0)
Platelets: 163 10*3/uL (ref 150–400)
RBC: 3.56 MIL/uL — AB (ref 4.22–5.81)
RDW: 13.5 % (ref 11.5–15.5)
WBC: 7.5 10*3/uL (ref 4.0–10.5)

## 2016-07-08 LAB — GLUCOSE, CAPILLARY: Glucose-Capillary: 89 mg/dL (ref 65–99)

## 2016-07-08 MED ORDER — FUROSEMIDE 40 MG PO TABS
40.0000 mg | ORAL_TABLET | Freq: Once | ORAL | Status: AC
  Administered 2016-07-08: 10:00:00 40 mg via ORAL
  Filled 2016-07-08: qty 1

## 2016-07-08 MED ORDER — ALBUTEROL SULFATE (2.5 MG/3ML) 0.083% IN NEBU
3.0000 mL | INHALATION_SOLUTION | RESPIRATORY_TRACT | Status: DC
  Administered 2016-07-09: 3 mL via RESPIRATORY_TRACT
  Filled 2016-07-08: qty 3

## 2016-07-08 MED ORDER — IOPAMIDOL (ISOVUE-370) INJECTION 76%
INTRAVENOUS | Status: AC
  Administered 2016-07-08: 10:00:00 100 mL
  Filled 2016-07-08: qty 100

## 2016-07-08 MED ORDER — METOPROLOL SUCCINATE ER 50 MG PO TB24
50.0000 mg | ORAL_TABLET | Freq: Every day | ORAL | Status: DC
  Administered 2016-07-08 – 2016-07-09 (×2): 50 mg via ORAL
  Filled 2016-07-08 (×2): qty 1

## 2016-07-08 NOTE — Progress Notes (Signed)
  Echocardiogram 2D Echocardiogram has been performed.  Chad Ellis 07/08/2016, 2:39 PM

## 2016-07-08 NOTE — Progress Notes (Signed)
Pt tx to 2w from [facility]. VSS, placed on tele. Pt c/o 9/10 pain with inspiration, meds given. Pt placed on 2L [place] for comfort. Family at bedside. Call bell and phone within reach. Will continue to monitor.

## 2016-07-08 NOTE — Progress Notes (Signed)
Patient Name: Chad Ellis Date of Encounter: 07/08/2016  Primary Cardiologist: Dr. [place]   Hospital Problem List     Principal Problem:   Unstable angina [facility]) Active Problems:   Atrial fibrillation with RVR (HCC)   Dyspnea on exertion   Essential hypertension   Non-ST elevation (NSTEMI) myocardial infarction [facility])   Chest pain     Subjective   Still complaining of chest pain with inspiration and SOB as well as palpitations.   Inpatient Medications    Scheduled Meds: . aspirin  81 mg Oral Daily  . atorvastatin  80 mg Oral q1800  . isosorbide mononitrate  30 mg Oral Daily  . lisinopril  10 mg Oral Daily  . metoprolol succinate  50 mg Oral Daily  . sodium chloride flush  3 mL Intravenous Q12H  . sodium chloride flush  3 mL Intravenous Q12H  . ticagrelor  90 mg Oral BID   Continuous Infusions: . sodium chloride     PRN Meds: sodium chloride, acetaminophen, ALPRAZolam, alum & mag hydroxide-simeth, diazepam, nitroGLYCERIN, ondansetron (ZOFRAN) IV, oxyCODONE-acetaminophen, promethazine, sodium chloride flush   Vital Signs    Vitals:   07/08/16 0000 07/08/16 0400 07/08/16 0600 07/08/16 0700  BP: 115/88 (!) 143/92 (!) 135/105 139/79  Pulse: 95 88 90 97  Resp: (!) 21 18 (!) 21 (!) 27  Temp: 98.7 F (37.1 C)   98.7 F (37.1 C)  TempSrc: Oral   Oral  SpO2: 97%  96% 97%  Weight:      Height:        Intake/Output Summary (Last 24 hours) at 07/08/16 0935 Last data filed at 07/08/16 0700  Gross per 24 hour  Intake              960 ml  Output              300 ml  Net              660 ml   Filed Weights   07/06/16 0245 07/07/16 0400  Weight: 279 lb 11.2 oz (126.9 kg) 285 lb 7.9 oz (129.5 kg)    Physical Exam   GEN: Well nourished, well developed, in no acute distress.  HEENT: Grossly normal.  Neck: Supple, no JVD, carotid bruits, or masses. Cardiac: mildly tachycardic, no murmurs, rubs, or gallops. No clubbing, cyanosis, edema.  Radials/DP/PT 2+ and  equal bilaterally.  Respiratory:  Respirations regular and unlabored, clear to auscultation bilaterally. GI: Soft, nontender, nondistended, BS + x 4. MS: no deformity or atrophy. Skin: warm and dry, no rash. Neuro:  Strength and sensation are intact. Psych: AAOx3.  Normal affect.  Labs    CBC  Recent Labs  07/07/16 0002 07/08/16 0428  WBC 6.3 7.5  HGB 11.7* 11.1*  HCT 35.1* 33.6*  MCV 93.6 94.4  PLT 204 810   Basic Metabolic Panel  Recent Labs  07/06/16 0307 07/07/16 0002  NA 140 141  K 3.5 3.6  CL 105 106  CO2 22 26  GLUCOSE 100* 108*  BUN 18 12  CREATININE 1.24 1.00  CALCIUM 8.5* 8.4*   Liver Function Tests cv  Recent Labs  07/06/16 0307  AST 44*  ALT 31  ALKPHOS 52  BILITOT 0.3  PROT 6.0*  ALBUMIN 3.5   No results for input(s): LIPASE, AMYLASE in the last 72 hours. Cardiac Enzymes  Recent Labs  07/06/16 0307 07/06/16 0849 07/06/16 1824  TROPONINI 4.73* 5.97* 6.22*   BNP Invalid input(s): POCBNP D-Dimer  No results for input(s): DDIMER in the last 72 hours. Hemoglobin A1C  Recent Labs  07/06/16 0307  HGBA1C 5.4   Fasting Lipid Panel  Recent Labs  07/06/16 0307  CHOL 191  HDL 23*  LDLCALC 105*  TRIG 316*  CHOLHDL 8.3   Thyroid Function Tests  Recent Labs  07/06/16 0307  TSH 10.311*    Telemetry    Sinus tachy with PAC, possible junctional rhythm or ectopic atrial rhythm with PVCs - Personally Reviewed  ECG    Possible junctional tachycardia, PAC, PVC, inferior infarct HR 93 - Personally Reviewed  Radiology    No results found.  Cardiac Studies   LHC: 07/06/16 Conclusion   1st Mrg lesion, 20 %stenosed.  Mid LAD lesion, 20 %stenosed.  A STENT SYNERGY DES 3.5X38 drug eluting stent was successfully placed.  Prox RCA to Mid RCA lesion, 100 %stenosed.  Post intervention, there is a 0% residual stenosis.  The left ventricular ejection fraction is 45-50% by visual estimate.  There is mild left ventricular  systolic dysfunction.  Acute/subacute very proximal RCA occlusion with extensive thrombus burden and TIMI 0 flow.  Mild nonobstructive concomitant CAD with 20% LAD stenosis and 20% circumflex narrowing and a large left circumflex vessel. There was a hint of very minimal if any distal collateralization of the PLA vessel from the RCA via the left injection.  Difficult but successful PCI to the totally occluded RCA with extensive thrombus requiring thrombectomy, PTCA, and ultimate DES stenting with a 3.538 mm Synergy DES stent postdilated to 4.14 mm very proximally and 3.9-4.0 mm distally in a very large RCA that supplies the PDA and PLA vessel.  RECOMMENDATION: Dual antiplatelet therapy. The patient will continue on Aggrastat for 18 hours post procedure. The patient will be started on high potency statin therapy, and medical therapy post MI including ACE-I/ARB, and beta blockerRx.     Patient Profile     57 y.o. male with PMH of HTN who presented with ongoing progressive dyspnea and chest pain to [place] to have AF, and elevated troponin and transferred to [facility] for further work up. Underwent cardiac cath 07/06/16.  Assessment & Plan    1. AF RVR: found to be in new onset afib with RVR upon presentation. He was placed on IV Dilt and heparin. Converted to SR 07/06/16 am. Telemetry appears to be in SR with PACs, PVCs, possibly junctional vs ectopic atrial rhythm at times.  -- Dr. [place] felt afib was related to ACS with acute occlusion of RCA.  -- At this timeCHA2DS2-VASc Score and unadjusted Ischemic Stroke Rate (% per year) is equal to 0.6 % stroke rate/year from a score of 2 for HTN, MI. No plan for [place] at this time.  -- TSH high, T4 normal -- 2D ECHO pending   2. NSTEMI:Underwent cardiac cath on 07/06/16 with 100% occlusion in the RCA treated with thrombectomy and DES x1. Large thrombus burden. He was continued on aggrastat for 18 hours.  -- He has been having  ongoing chest pain, SOB and heart racing. Chest pain worse with inspiration.  -- Currently on Imdur 30mg , Brilinta/ASA, Toprol XL 25mg  daily and atorvastatin 80mg  daily.   3. HTN:he remains mildly hypertensive this morning and tachycardic. Will increase Toprol XL from 25mg  --> 50mg  daily.    4. HLD: started on high dose statin, LDL 105, HDL 23  Dispo: plan was for possible discharge today. With ongoing chest pain and SOB, I would like to wait for echo.  May need to get CTA to rule out PE given SOB, inspirational chest pain and tachycardia. Will discuss with Dr. Meda Coffee  Signed, Ena Dawley, MD  07/08/2016, 9:35 AM   The patient was seen, examined and discussed with Nell Range, PA-C and I agree with the above.   56 y.o. male with PMH of HTN who presented with ongoing progressive dyspnea and chest pain to [place] to have AF, and elevated troponin and transferred to [facility] for further work up. Underwent cardiac cath 07/06/16 ---> subacute RCA occlusion --> thrombectomy, RCA stent. On admission he was in atrial fibrillation, spontaneously cardioverted to ectopic atrial rhythm with ventricular rates in upper 90'. He feels palpitations, SOB that is worse post cath and pleuritic chest pain. Diff dg includes: 1. PE 2. Acute post MI pericarditis  3. Brilinta  We will arrange for chest CTA to rule out PE, increase metoprolol to 50 mg po QD, transfer to telemetry, give lasix 40 po once for basilar crackles.  Ena Dawley, MD 07/08/2016

## 2016-07-08 NOTE — Progress Notes (Signed)
CARDIAC REHAB PHASE I   PRE:  Rate/Rhythm: 99 SR PVCs  BP:  Supine: 138/86 Sitting:   Standing:    SaO2: 100% 2L O2  MODE:  Ambulation: 600 ft   POST:  Rate/Rhythem: 112  BP:  Supine:   Sitting: 127/86  Standing:    SaO2: 94% RA  [id number] Pt tolerated ambulation fair with assist x1. Gait steady, one standing rest break taken. Pt c/o chest discomfort with breathing and SOB. SaO2 maintained at 94% and greater during ambulation. To bed and placed back on 2L O2 in room. Exercise guidelines and activity progression reviewed with patient. Pt verbalizes understanding of instructions given. Discussed Phase 2 cardiac rehab and pt is interested in program at [facility], although concerned about lack of insurance. Advised him that he can discuss financial assistance at [facility] when he's contacted about participating in the program. Will send Phase 2 referral to [facility].  Seward Carol, MS, ACSM CEP

## 2016-07-09 ENCOUNTER — Other Ambulatory Visit: Payer: Self-pay

## 2016-07-09 LAB — BASIC METABOLIC PANEL
ANION GAP: 10 (ref 5–15)
BUN: 10 mg/dL (ref 6–20)
CHLORIDE: 101 mmol/L (ref 101–111)
CO2: 26 mmol/L (ref 22–32)
Calcium: 8.7 mg/dL — ABNORMAL LOW (ref 8.9–10.3)
Creatinine, Ser: 1 mg/dL (ref 0.61–1.24)
Glucose, Bld: 116 mg/dL — ABNORMAL HIGH (ref 65–99)
POTASSIUM: 3.5 mmol/L (ref 3.5–5.1)
SODIUM: 137 mmol/L (ref 135–145)

## 2016-07-09 LAB — CBC
HEMATOCRIT: 33.3 % — AB (ref 39.0–52.0)
HEMOGLOBIN: 11.1 g/dL — AB (ref 13.0–17.0)
MCH: 31.6 pg (ref 26.0–34.0)
MCHC: 33.3 g/dL (ref 30.0–36.0)
MCV: 94.9 fL (ref 78.0–100.0)
Platelets: 179 10*3/uL (ref 150–400)
RBC: 3.51 MIL/uL — AB (ref 4.22–5.81)
RDW: 13.4 % (ref 11.5–15.5)
WBC: 7.4 10*3/uL (ref 4.0–10.5)

## 2016-07-09 LAB — BRAIN NATRIURETIC PEPTIDE: B NATRIURETIC PEPTIDE 5: 245.8 pg/mL — AB (ref 0.0–100.0)

## 2016-07-09 LAB — GLUCOSE, CAPILLARY: GLUCOSE-CAPILLARY: 92 mg/dL (ref 65–99)

## 2016-07-09 MED ORDER — CLOPIDOGREL BISULFATE 75 MG PO TABS
300.0000 mg | ORAL_TABLET | Freq: Once | ORAL | Status: AC
  Administered 2016-07-09: 300 mg via ORAL
  Filled 2016-07-09: qty 4

## 2016-07-09 MED ORDER — CLOPIDOGREL BISULFATE 75 MG PO TABS
75.0000 mg | ORAL_TABLET | Freq: Every day | ORAL | Status: DC
  Administered 2016-07-10: 75 mg via ORAL
  Filled 2016-07-09: qty 1

## 2016-07-09 MED ORDER — FUROSEMIDE 10 MG/ML IJ SOLN
40.0000 mg | Freq: Once | INTRAMUSCULAR | Status: AC
  Administered 2016-07-09: 40 mg via INTRAVENOUS
  Filled 2016-07-09: qty 4

## 2016-07-09 MED ORDER — ALBUTEROL SULFATE (2.5 MG/3ML) 0.083% IN NEBU: 3.0000 mL | INHALATION_SOLUTION | Freq: Four times a day (QID) | RESPIRATORY_TRACT | Status: DC | PRN

## 2016-07-09 MED ORDER — METOPROLOL SUCCINATE ER 50 MG PO TB24
50.0000 mg | ORAL_TABLET | Freq: Every day | ORAL | Status: DC
  Administered 2016-07-10: 50 mg via ORAL
  Filled 2016-07-09: qty 1

## 2016-07-09 MED ORDER — METOPROLOL SUCCINATE ER 50 MG PO TB24: 75.0000 mg | ORAL_TABLET | Freq: Every day | ORAL | Status: DC

## 2016-07-09 MED ORDER — SOTALOL HCL 80 MG PO TABS
120.0000 mg | ORAL_TABLET | Freq: Two times a day (BID) | ORAL | Status: DC
  Administered 2016-07-09 – 2016-07-10 (×3): 120 mg via ORAL
  Filled 2016-07-09 (×3): qty 2

## 2016-07-09 MED ORDER — COLCHICINE 0.6 MG PO TABS
0.6000 mg | ORAL_TABLET | Freq: Two times a day (BID) | ORAL | Status: DC
  Administered 2016-07-09 – 2016-07-10 (×3): 0.6 mg via ORAL
  Filled 2016-07-09 (×3): qty 1

## 2016-07-09 MED ORDER — METOPROLOL SUCCINATE ER 25 MG PO TB24: 25.0000 mg | ORAL_TABLET | Freq: Once | ORAL | Status: DC

## 2016-07-09 MED ORDER — APIXABAN 5 MG PO TABS: 5.0000 mg | ORAL_TABLET | Freq: Two times a day (BID) | ORAL | Status: DC

## 2016-07-09 MED ORDER — RIVAROXABAN 20 MG PO TABS
20.0000 mg | ORAL_TABLET | Freq: Every day | ORAL | Status: DC
  Administered 2016-07-09: 20 mg via ORAL
  Filled 2016-07-09: qty 1

## 2016-07-09 MED ORDER — ALBUTEROL SULFATE (2.5 MG/3ML) 0.083% IN NEBU
3.0000 mL | INHALATION_SOLUTION | Freq: Three times a day (TID) | RESPIRATORY_TRACT | Status: DC
  Administered 2016-07-09: 3 mL via RESPIRATORY_TRACT
  Filled 2016-07-09: qty 3

## 2016-07-09 NOTE — Progress Notes (Signed)
Patient Name: Chad Ellis Date of Encounter: 07/09/2016  Primary Cardiologist: Dr. Martinique   Hospital Problem List     Principal Problem:   Unstable angina [facility]) Active Problems:   Atrial fibrillation with RVR (HCC)   Dyspnea on exertion   Essential hypertension   Non-ST elevation (NSTEMI) myocardial infarction [facility])   Chest pain     Subjective   Still having sharp chest pain with inspiration. Still some SOB and palpitations.   Inpatient Medications    Scheduled Meds: . albuterol  3 mL Inhalation TID  . aspirin  81 mg Oral Daily  . atorvastatin  80 mg Oral q1800  . isosorbide mononitrate  30 mg Oral Daily  . lisinopril  10 mg Oral Daily  . metoprolol succinate  50 mg Oral Daily  . sodium chloride flush  3 mL Intravenous Q12H  . sodium chloride flush  3 mL Intravenous Q12H  . ticagrelor  90 mg Oral BID   Continuous Infusions: . sodium chloride     PRN Meds: sodium chloride, acetaminophen, ALPRAZolam, alum & mag hydroxide-simeth, diazepam, nitroGLYCERIN, ondansetron (ZOFRAN) IV, oxyCODONE-acetaminophen, promethazine, sodium chloride flush   Vital Signs    Vitals:   07/09/16 0151 07/09/16 0457 07/09/16 0721 07/09/16 0946  BP:  95/60  118/86  Pulse:  84  95  Resp:      Temp:  98.2 F (36.8 C)    TempSrc:  Oral    SpO2:  100% 98%   Weight: 276 lb (125.2 kg)     Height:        Intake/Output Summary (Last 24 hours) at 07/09/16 0952 Last data filed at 07/09/16 0945  Gross per 24 hour  Intake                3 ml  Output             2650 ml  Net            -2647 ml   Filed Weights   07/06/16 0245 07/07/16 0400 07/09/16 0151  Weight: 279 lb 11.2 oz (126.9 kg) 285 lb 7.9 oz (129.5 kg) 276 lb (125.2 kg)    Physical Exam   GEN: Well nourished, well developed, in no acute distress.  HEENT: Grossly normal.  Neck: Supple, no JVD, carotid bruits, or masses. Cardiac: irreg irreg, tachy, no murmurs, rubs, or gallops. No clubbing, cyanosis, edema.   Radials/DP/PT 2+ and equal bilaterally.  Respiratory:  Respirations regular and unlabored, clear to auscultation bilaterally. GI: Soft, nontender, nondistended, BS + x 4. MS: no deformity or atrophy. Skin: warm and dry, no rash. Neuro:  Strength and sensation are intact. Psych: AAOx3.  Normal affect.  Labs    CBC  Recent Labs  07/08/16 0428 07/09/16 0223  WBC 7.5 7.4  HGB 11.1* 11.1*  HCT 33.6* 33.3*  MCV 94.4 94.9  PLT 163 546   Basic Metabolic Panel  Recent Labs  07/07/16 0002  NA 141  K 3.6  CL 106  CO2 26  GLUCOSE 108*  BUN 12  CREATININE 1.00  CALCIUM 8.4*   Liver Function Tests cv No results for input(s): AST, ALT, ALKPHOS, BILITOT, PROT, ALBUMIN in the last 72 hours. No results for input(s): LIPASE, AMYLASE in the last 72 hours. Cardiac Enzymes  Recent Labs  07/06/16 1824  TROPONINI 6.22*   BNP Invalid input(s): POCBNP D-Dimer No results for input(s): DDIMER in the last 72 hours. Hemoglobin A1C No results for input(s): HGBA1C in the  last 72 hours. Fasting Lipid Panel No results for input(s): CHOL, HDL, LDLCALC, TRIG, CHOLHDL, LDLDIRECT in the last 72 hours. Thyroid Function Tests No results for input(s): TSH, T4TOTAL, T3FREE, THYROIDAB in the last 72 hours.  Invalid input(s): FREET3  Telemetry    Sinus tachy with PAC, possible junctional rhythm or ectopic atrial rhythm with PVCs - Personally Reviewed  ECG    Possible junctional tachycardia, PAC, PVC, inferior infarct HR 93 - Personally Reviewed  Radiology    Ct Angio Chest Pe W Or Wo Contrast  Result Date: 07/08/2016 CLINICAL DATA:  Chest pain and shortness of breath EXAM: CT ANGIOGRAPHY CHEST WITH CONTRAST TECHNIQUE: Multidetector CT imaging of the chest was performed using the standard protocol during bolus administration of intravenous contrast. Multiplanar CT image reconstructions and MIPs were obtained to evaluate the vascular anatomy. CONTRAST:  80 mL Isovue 370 nonionic COMPARISON:   Chest CT July 05, 2016 FINDINGS: Cardiovascular: There is no demonstrable pulmonary embolus. There is no appreciable thoracic aortic aneurysm or dissection. There is minimal calcification in the proximal left subclavian artery. Other visualized great vessels appear unremarkable. The left and right common carotid arteries arise as a common trunk, an anatomic variant. There are scattered foci of coronary artery calcification. There is mild atherosclerotic calcification in the aorta. Pericardium is not appreciably thickened. Mediastinum/Nodes: Visualized thyroid appears normal. There is no appreciable thoracic adenopathy. There is a small hiatal hernia. Lungs/Pleura: There are new small pleural effusions bilaterally. There is no edema or consolidation. Upper Abdomen: There is slight reflux of contrast into the inferior vena cava. Visualized upper abdominal structures otherwise appear unremarkable except for scattered foci of arterial vascular calcification. Musculoskeletal: There is degenerative change in the lower thoracic region. There are no blastic or lytic bone lesions. Review of the MIP images confirms the above findings. IMPRESSION: No demonstrable pulmonary embolus. Scattered foci of atherosclerotic calcification and foci of coronary artery calcification. Small pleural effusions, new from recent study. Lungs elsewhere clear. No adenopathy. Mild reflux of contrast into the inferior vena cava may indicate a degree of increased right heart pressure. Electronically Signed   By: Lowella Grip III M.D.   On: 07/08/2016 10:44    Cardiac Studies   LHC: 07/06/16 Conclusion   1st Mrg lesion, 20 %stenosed.  Mid LAD lesion, 20 %stenosed.  A STENT SYNERGY DES 3.5X38 drug eluting stent was successfully placed.  Prox RCA to Mid RCA lesion, 100 %stenosed.  Post intervention, there is a 0% residual stenosis.  The left ventricular ejection fraction is 45-50% by visual estimate.  There is mild left  ventricular systolic dysfunction.  Acute/subacute very proximal RCA occlusion with extensive thrombus burden and TIMI 0 flow.  Mild nonobstructive concomitant CAD with 20% LAD stenosis and 20% circumflex narrowing and a large left circumflex vessel. There was a hint of very minimal if any distal collateralization of the PLA vessel from the RCA via the left injection.  Difficult but successful PCI to the totally occluded RCA with extensive thrombus requiring thrombectomy, PTCA, and ultimate DES stenting with a 3.538 mm Synergy DES stent postdilated to 4.14 mm very proximally and 3.9-4.0 mm distally in a very large RCA that supplies the PDA and PLA vessel.  RECOMMENDATION: Dual antiplatelet therapy. The patient will continue on Aggrastat for 18 hours post procedure. The patient will be started on high potency statin therapy, and medical therapy post MI including ACE-I/ARB, and beta blockerRx.    2D ECHO: 07/08/2016 LV EF: 55% -   60%  Study 00    Conclusions - Left ventricle: The cavity size was normal. Wall thickness was   increased in a pattern of mild LVH. Systolic function was c.   The estimated ejection fraction was in the range of 55% to 60%.   Basal inferior akinesis. Doppler parameters are consistent with   abnormal left ventricular relaxation (grade 1 diastolic   dysfunction).[id number] - Aortic valve: There was no stenosis. - Aorta: Mildly dilated aortic root. Aortic root dimension: 38 mm   (ED). - Mitral valve: There was trivial regurgitation. - Left atrium: The atrium was mildly dilated. - Right ventricle: Mildly dilated RV with mild to moderately   decreased systolic function. The free wall is hypokinetic   compared to the apex. No PE on CTA, would be concerned for RV   infarction. - Right atrium: The atrium was mildly dilated. - Pulmonary arteries: No complete TR doppler jet so unable to   estimate PA systolic pressure. - Systemic veins: IVC measured 2.4 cm  with > 50% respirophasic   variation, suggesting RA pressure 8 mmHg. Impressions: - Normal LV size with mild LV hypertrophy. EF 55-60%, basal   inferior akinesis. The RV was mildly dilated with mild to   moderate systolic dysfunction. Free wall was hypokinetic compared   to apex, in absence of PE would consider RV infarction. No   significant valvular abnormalities.    Patient Profile     56 y.o. male with PMH of HTN who presented with ongoing progressive dyspnea and chest pain to [place] to have AF, and elevated troponin and transferred to [facility] for further work up. Underwent cardiac cath 07/06/16.  Assessment & Plan    AF RVR: found to be in new onset afib with RVR upon presentation. He was placed on IV Dilt and heparin. Converted to SR 07/06/16 am. He remained in ectopic atrial rhythm until this AM, now back in afib with RVR. HR 150s. Discussed case with Dr. Rayann Heman and plan will be to start Sotolol 120mg  BID and a DOAC. Will need to switch her Brilinta to plavix to reduce risk of bleeding -- Dr. Martinique felt afib was related to ACS with acute occlusion of RCA. However, now back in afib with RVR. We will need to start Xarelto 20mg  daily tomorrow after we load with plavix 300mg  daily today and stop Brilinta.  -- At this timeCHA2DS2-VASc Score and unadjusted Ischemic Stroke Rate (% per year) is equal to 0.6 % stroke rate/year from a score of 2 for HTN, MI.  -- TSH high, T4 normal -- 2D ECHO showed normal LV function with mild LVH, basal inferior HK, mildly dilated RV with mild-mod RV dysfunction, free wall hypokinetic compared to apex (in absence of PE, would consider RV infarction).   Ectopic atrial tachycardia: now back in atrial fib with RVR.   NSTEMI:(peak troponin 6.22). Underwent cardiac cath on 07/06/16 with 100% occlusion in the RCA treated with thrombectomy and DES x1. Large thrombus burden. He was continued on aggrastat for 18 hours.  -- He has been having ongoing  chest pain, SOB and heart racing. Chest pain worse with inspiration. CTA negative for PE but did show some new pleural effusions. Still complains of sharp pain with inspiration. Will empirically try colchicine 0.6mg  BID -- Currently on Imdur 30mg , Brilinta/ASA, Toprol XL 50mg  daily and atorvastatin 80mg  daily. As above, with need for for DOAC, will stop Brilinta and load with plavix.   HTN: BP soft this AM (95/60), but now  up to 118/86. Continue to monitor.    HLD: started on high dose statin, LDL 105, HDL 23  Acute on chronic diastolic CHF: has some crackles on exam. Symptomatically improved with IV lasix yesterday. CT angio (to rule out PE) showed new pleural effusions. Unfortunately, we have no BMETs since 07/07/16. I have ordered one stat. I want to see results of this before giving more IV lasix.   Signed, Angelena Form, PA-C  07/09/2016, 9:52 AM   I have seen, examined the patient, and reviewed the above assessment and plan.   On exam, iRRR. Changes to above are made where necessary.   Pt with return of afib today.  I have discussed anticoagulation with Dr Martinique.  We agree with switching brilinta to plavix (with 300mg  load) and then initiation of NOAC.  Today, I discussed Coumadin as well as novel anticoagulants including Pradaxa, Xarelto, and Eliquis today as indicated for risk reduction in stroke and systemic emboli with nonvalvular atrial fibrillation.  Risks, benefits, and alternatives to each of these drugs were discussed at length today.  He states that his father is on xarelto and has done well.  He prefers this over the others.   Will also initiate sotalol for afib management.  Will need follow-up in af clinic on discharge.  Very complicated patient.  A high level of decision making was required for this encounter.   Co Sign: Thompson Grayer, MD 07/09/2016 10:29 AM

## 2016-07-09 NOTE — Progress Notes (Deleted)
Patient Name: Chad Ellis Date of Encounter: 07/09/2016  Primary Cardiologist: Dr. Martinique   Hospital Problem List     Principal Problem:   Unstable angina [facility]) Active Problems:   Atrial fibrillation with RVR (HCC)   Dyspnea on exertion   Essential hypertension   Non-ST elevation (NSTEMI) myocardial infarction [facility])   Chest pain     Subjective   Still complaining of chest pain with inspiration and SOB as well as palpitations.   Inpatient Medications    Scheduled Meds: . albuterol  3 mL Inhalation TID  . aspirin  81 mg Oral Daily  . atorvastatin  80 mg Oral q1800  . isosorbide mononitrate  30 mg Oral Daily  . lisinopril  10 mg Oral Daily  . metoprolol succinate  50 mg Oral Daily  . sodium chloride flush  3 mL Intravenous Q12H  . sodium chloride flush  3 mL Intravenous Q12H  . ticagrelor  90 mg Oral BID   Continuous Infusions: . sodium chloride     PRN Meds: sodium chloride, acetaminophen, ALPRAZolam, alum & mag hydroxide-simeth, diazepam, nitroGLYCERIN, ondansetron (ZOFRAN) IV, oxyCODONE-acetaminophen, promethazine, sodium chloride flush   Vital Signs    Vitals:   07/09/16 0021 07/09/16 0151 07/09/16 0457 07/09/16 0721  BP:   95/60   Pulse:   84   Resp:      Temp:   98.2 F (36.8 C)   TempSrc:   Oral   SpO2: 97%  100% 98%  Weight:  276 lb (125.2 kg)    Height:        Intake/Output Summary (Last 24 hours) at 07/09/16 0922 Last data filed at 07/09/16 0600  Gross per 24 hour  Intake                0 ml  Output             2650 ml  Net            -2650 ml   Filed Weights   07/06/16 0245 07/07/16 0400 07/09/16 0151  Weight: 279 lb 11.2 oz (126.9 kg) 285 lb 7.9 oz (129.5 kg) 276 lb (125.2 kg)    Physical Exam   GEN: Well nourished, well developed, in no acute distress.  HEENT: Grossly normal.  Neck: Supple, no JVD, carotid bruits, or masses. Cardiac: mildly tachycardic, no murmurs, rubs, or gallops. No clubbing, cyanosis, edema.   Radials/DP/PT 2+ and equal bilaterally.  Respiratory:  Respirations regular and unlabored, clear to auscultation bilaterally. GI: Soft, nontender, nondistended, BS + x 4. MS: no deformity or atrophy. Skin: warm and dry, no rash. Neuro:  Strength and sensation are intact. Psych: AAOx3.  Normal affect.  Labs    CBC  Recent Labs  07/08/16 0428 07/09/16 0223  WBC 7.5 7.4  HGB 11.1* 11.1*  HCT 33.6* 33.3*  MCV 94.4 94.9  PLT 163 233   Basic Metabolic Panel  Recent Labs  07/07/16 0002  NA 141  K 3.6  CL 106  CO2 26  GLUCOSE 108*  BUN 12  CREATININE 1.00  CALCIUM 8.4*   Liver Function Tests cv No results for input(s): AST, ALT, ALKPHOS, BILITOT, PROT, ALBUMIN in the last 72 hours. No results for input(s): LIPASE, AMYLASE in the last 72 hours. Cardiac Enzymes  Recent Labs  07/06/16 1824  TROPONINI 6.22*   BNP Invalid input(s): POCBNP D-Dimer No results for input(s): DDIMER in the last 72 hours. Hemoglobin A1C No results for input(s): HGBA1C in the  last 72 hours. Fasting Lipid Panel No results for input(s): CHOL, HDL, LDLCALC, TRIG, CHOLHDL, LDLDIRECT in the last 72 hours. Thyroid Function Tests No results for input(s): TSH, T4TOTAL, T3FREE, THYROIDAB in the last 72 hours.  Invalid input(s): FREET3  Telemetry    Sinus tachy with PAC, possible junctional rhythm or ectopic atrial rhythm with PVCs - Personally Reviewed  ECG    Possible junctional tachycardia, PAC, PVC, inferior infarct HR 93 - Personally Reviewed  Radiology    Ct Angio Chest Pe W Or Wo Contrast  Result Date: 07/08/2016 CLINICAL DATA:  Chest pain and shortness of breath EXAM: CT ANGIOGRAPHY CHEST WITH CONTRAST TECHNIQUE: Multidetector CT imaging of the chest was performed using the standard protocol during bolus administration of intravenous contrast. Multiplanar CT image reconstructions and MIPs were obtained to evaluate the vascular anatomy. CONTRAST:  80 mL Isovue 370 nonionic COMPARISON:   Chest CT July 05, 2016 FINDINGS: Cardiovascular: There is no demonstrable pulmonary embolus. There is no appreciable thoracic aortic aneurysm or dissection. There is minimal calcification in the proximal left subclavian artery. Other visualized great vessels appear unremarkable. The left and right common carotid arteries arise as a common trunk, an anatomic variant. There are scattered foci of coronary artery calcification. There is mild atherosclerotic calcification in the aorta. Pericardium is not appreciably thickened. Mediastinum/Nodes: Visualized thyroid appears normal. There is no appreciable thoracic adenopathy. There is a small hiatal hernia. Lungs/Pleura: There are new small pleural effusions bilaterally. There is no edema or consolidation. Upper Abdomen: There is slight reflux of contrast into the inferior vena cava. Visualized upper abdominal structures otherwise appear unremarkable except for scattered foci of arterial vascular calcification. Musculoskeletal: There is degenerative change in the lower thoracic region. There are no blastic or lytic bone lesions. Review of the MIP images confirms the above findings. IMPRESSION: No demonstrable pulmonary embolus. Scattered foci of atherosclerotic calcification and foci of coronary artery calcification. Small pleural effusions, new from recent study. Lungs elsewhere clear. No adenopathy. Mild reflux of contrast into the inferior vena cava may indicate a degree of increased right heart pressure. Electronically Signed   By: Lowella Grip III M.D.   On: 07/08/2016 10:44    Cardiac Studies   LHC: 07/06/16 Conclusion   1st Mrg lesion, 20 %stenosed.  Mid LAD lesion, 20 %stenosed.  A STENT SYNERGY DES 3.5X38 drug eluting stent was successfully placed.  Prox RCA to Mid RCA lesion, 100 %stenosed.  Post intervention, there is a 0% residual stenosis.  The left ventricular ejection fraction is 45-50% by visual estimate.  There is mild left  ventricular systolic dysfunction.  Acute/subacute very proximal RCA occlusion with extensive thrombus burden and TIMI 0 flow.  Mild nonobstructive concomitant CAD with 20% LAD stenosis and 20% circumflex narrowing and a large left circumflex vessel. There was a hint of very minimal if any distal collateralization of the PLA vessel from the RCA via the left injection.  Difficult but successful PCI to the totally occluded RCA with extensive thrombus requiring thrombectomy, PTCA, and ultimate DES stenting with a 3.538 mm Synergy DES stent postdilated to 4.14 mm very proximally and 3.9-4.0 mm distally in a very large RCA that supplies the PDA and PLA vessel.  RECOMMENDATION: Dual antiplatelet therapy. The patient will continue on Aggrastat for 18 hours post procedure. The patient will be started on high potency statin therapy, and medical therapy post MI including ACE-I/ARB, and beta blockerRx.    2D ECHO: 07/08/2016 LV EF: 55% -   60%  Study 00    Conclusions - Left ventricle: The cavity size was normal. Wall thickness was   increased in a pattern of mild LVH. Systolic function was c.   The estimated ejection fraction was in the range of 55% to 60%.   Basal inferior akinesis. Doppler parameters are consistent with   abnormal left ventricular relaxation (grade 1 diastolic   dysfunction).[phone] - Aortic valve: There was no stenosis. - Aorta: Mildly dilated aortic root. Aortic root dimension: 38 mm   (ED). - Mitral valve: There was trivial regurgitation. - Left atrium: The atrium was mildly dilated. - Right ventricle: Mildly dilated RV with mild to moderately   decreased systolic function. The free wall is hypokinetic   compared to the apex. No PE on CTA, would be concerned for RV   infarction. - Right atrium: The atrium was mildly dilated. - Pulmonary arteries: No complete TR doppler jet so unable to   estimate PA systolic pressure. - Systemic veins: IVC measured 2.4 cm  with > 50% respirophasic   variation, suggesting RA pressure 8 mmHg. Impressions: - Normal LV size with mild LV hypertrophy. EF 55-60%, basal   inferior akinesis. The RV was mildly dilated with mild to   moderate systolic dysfunction. Free wall was hypokinetic compared   to apex, in absence of PE would consider RV infarction. No   significant valvular abnormalities.    Patient Profile     55 y.o. male with PMH of HTN who presented with ongoing progressive dyspnea and chest pain to [facility] to have AF, and elevated troponin and transferred to [facility] for further work up. Underwent cardiac cath 07/06/16.  Assessment & Plan    AF RVR: found to be in new onset afib with RVR upon presentation. He was placed on IV Dilt and heparin. Converted to SR 07/06/16 am. Telemetry appears to be in SR with PACs, PVCs, possibly junctional vs ectopic atrial rhythm at times.  -- Dr. Martinique felt afib was related to ACS with acute occlusion of RCA.  -- At this timeCHA2DS2-VASc Score and unadjusted Ischemic Stroke Rate (% per year) is equal to 0.6 % stroke rate/year from a score of 2 for HTN, MI. No plan for [place] at this time.  -- TSH high, T4 normal -- 2D ECHO pending   Ectopic atrial tachycardia: HR improved on increased BB. He   NSTEMI:Underwent cardiac cath on 07/06/16 with 100% occlusion in the RCA treated with thrombectomy and DES x1. Large thrombus burden. He was continued on aggrastat for 18 hours.  -- He has been having ongoing chest pain, SOB and heart racing. Chest pain worse with inspiration. CTA negative for PE but did show some new pleural effusions -- Currently on Imdur 30mg , Brilinta/ASA, Toprol XL 25mg  daily and atorvastatin 80mg  daily.   HTN: BP soft this AM. Continue to monitor.    HLD: started on high dose statin, LDL 105, HDL 23    Signed, Angelena Form, PA-C  07/09/2016, 9:22 AM

## 2016-07-09 NOTE — Discharge Instructions (Addendum)
Information on my medicine - XARELTO (Rivaroxaban)  This medication education was reviewed with me or my healthcare representative as part of my discharge preparation.    Why was Xarelto prescribed for you? Xarelto was prescribed for you to reduce the risk of a blood clot forming that can cause a stroke if you have a medical condition called atrial fibrillation (a type of irregular heartbeat).  What do you need to know about xarelto ? Take your Xarelto ONCE DAILY at the same time every day with your evening meal. If you have difficulty swallowing the tablet whole, you may crush it and mix in applesauce just prior to taking your dose.  Take Xarelto exactly as prescribed by your doctor and DO NOT stop taking Xarelto without talking to the doctor who prescribed the medication.  Stopping without other stroke prevention medication to take the place of Xarelto may increase your risk of developing a clot that causes a stroke.  Refill your prescription before you run out.  After discharge, you should have regular check-up appointments with your healthcare provider that is prescribing your Xarelto.  In the future your dose may need to be changed if your kidney function or weight changes by a significant amount.  What do you do if you miss a dose? If you are taking Xarelto ONCE DAILY and you miss a dose, take it as soon as you remember on the same day then continue your regularly scheduled once daily regimen the next day. Do not take two doses of Xarelto at the same time or on the same day.   Important Safety Information A possible side effect of Xarelto is bleeding. You should call your healthcare provider right away if you experience any of the following: ? Bleeding from an injury or your nose that does not stop. ? Unusual colored urine (red or dark brown) or unusual colored stools (red or black). ? Unusual bruising for unknown reasons. ? A serious fall or if you hit your head (even if  there is no bleeding).  Some medicines may interact with Xarelto and might increase your risk of bleeding while on Xarelto. To help avoid this, consult your healthcare provider or pharmacist prior to using any new prescription or non-prescription medications, including herbals, vitamins, non-steroidal anti-inflammatory drugs (NSAIDs) and supplements.  This website has more information on Xarelto: [email or web address].[email or web address]   Radial Site Care Refer to this sheet in the next few weeks. These instructions provide you with information on caring for yourself after your procedure. Your caregiver may also give you more specific instructions. Your treatment has been planned according to current medical practices, but problems sometimes occur. Call your caregiver if you have any problems or questions after your procedure. HOME CARE INSTRUCTIONS  You may shower the day after the procedure.Remove the bandage (dressing) and gently wash the site with plain soap and water.Gently pat the site dry.   Do not apply powder or lotion to the site.   Do not submerge the affected site in water for 3 to 5 days.   Inspect the site at least twice daily.   Do not flex or bend the affected arm for 24 hours.   No lifting over 5 pounds (2.3 kg) for 5 days after your procedure.   Do not drive home if you are discharged the same day of the procedure. Have someone else drive you.   You may drive 24 hours after the procedure unless otherwise instructed by your caregiver.  What to expect:  Any bruising will usually fade within 1 to 2 weeks.   Blood that collects in the tissue (hematoma) may be painful to the touch. It should usually decrease in size and tenderness within 1 to 2 weeks.  SEEK IMMEDIATE MEDICAL CARE IF:  You have unusual pain at the radial site.   You have redness, warmth, swelling, or pain at the radial site.   You have drainage (other than a small amount of blood on the dressing).   You have chills.     You have a fever or persistent symptoms for more than 72 hours.   You have a fever and your symptoms suddenly get worse.   Your arm becomes pale, cool, tingly, or numb.   You have heavy bleeding from the site. Hold pressure on the site.

## 2016-07-09 NOTE — Progress Notes (Signed)
Patient complain of chest pressure. Assessed, VSS. Oxygen 100% on RA. Percocet, Valium and mailox given. EKG unreamakable. Patient anxious. Will continue to monitor.

## 2016-07-10 ENCOUNTER — Encounter (HOSPITAL_COMMUNITY): Payer: Self-pay | Admitting: Physician Assistant

## 2016-07-10 DIAGNOSIS — I2510 Atherosclerotic heart disease of native coronary artery without angina pectoris: Secondary | ICD-10-CM | POA: Diagnosis present

## 2016-07-10 DIAGNOSIS — I491 Atrial premature depolarization: Secondary | ICD-10-CM | POA: Diagnosis present

## 2016-07-10 DIAGNOSIS — I10 Essential (primary) hypertension: Secondary | ICD-10-CM | POA: Diagnosis present

## 2016-07-10 DIAGNOSIS — E785 Hyperlipidemia, unspecified: Secondary | ICD-10-CM | POA: Diagnosis present

## 2016-07-10 DIAGNOSIS — I480 Paroxysmal atrial fibrillation: Secondary | ICD-10-CM | POA: Diagnosis present

## 2016-07-10 DIAGNOSIS — I241 Dressler's syndrome: Secondary | ICD-10-CM | POA: Diagnosis present

## 2016-07-10 LAB — BASIC METABOLIC PANEL WITH GFR
Anion gap: 7 (ref 5–15)
BUN: 10 mg/dL (ref 6–20)
CO2: 27 mmol/L (ref 22–32)
Calcium: 8.4 mg/dL — ABNORMAL LOW (ref 8.9–10.3)
Chloride: 105 mmol/L (ref 101–111)
Creatinine, Ser: 0.95 mg/dL (ref 0.61–1.24)
GFR calc Af Amer: >60 mL/min
GFR calc non Af Amer: >60 mL/min
Glucose, Bld: 91 mg/dL (ref 65–99)
Potassium: 3.8 mmol/L (ref 3.5–5.1)
Sodium: 139 mmol/L (ref 135–145)

## 2016-07-10 LAB — CBC
HEMATOCRIT: 33.6 % — AB (ref 39.0–52.0)
Hemoglobin: 11 g/dL — ABNORMAL LOW (ref 13.0–17.0)
MCH: 30.9 pg (ref 26.0–34.0)
MCHC: 32.7 g/dL (ref 30.0–36.0)
MCV: 94.4 fL (ref 78.0–100.0)
Platelets: 198 10*3/uL (ref 150–400)
RBC: 3.56 MIL/uL — ABNORMAL LOW (ref 4.22–5.81)
RDW: 13.4 % (ref 11.5–15.5)
WBC: 7.3 10*3/uL (ref 4.0–10.5)

## 2016-07-10 LAB — GLUCOSE, CAPILLARY: Glucose-Capillary: 91 mg/dL (ref 65–99)

## 2016-07-10 LAB — MAGNESIUM: Magnesium: 2.1 mg/dL (ref 1.7–2.4)

## 2016-07-10 MED ORDER — COLCHICINE 0.6 MG PO TABS: 0.6000 mg | ORAL_TABLET | Freq: Two times a day (BID) | ORAL | 0 refills | Status: DC

## 2016-07-10 MED ORDER — ATORVASTATIN CALCIUM 80 MG PO TABS: 80.0000 mg | ORAL_TABLET | Freq: Every day | ORAL | 1 refills | Status: DC

## 2016-07-10 MED ORDER — ASPIRIN 81 MG PO CHEW: 81.0000 mg | CHEWABLE_TABLET | Freq: Every day | ORAL | Status: DC

## 2016-07-10 MED ORDER — ISOSORBIDE MONONITRATE ER 30 MG PO TB24: 30.0000 mg | ORAL_TABLET | Freq: Every day | ORAL | 2 refills | Status: DC

## 2016-07-10 MED ORDER — SOTALOL HCL 120 MG PO TABS: 120.0000 mg | ORAL_TABLET | Freq: Two times a day (BID) | ORAL | 6 refills | Status: DC

## 2016-07-10 MED ORDER — CLOPIDOGREL BISULFATE 75 MG PO TABS: 75.0000 mg | ORAL_TABLET | Freq: Every day | ORAL | 4 refills | Status: DC

## 2016-07-10 MED ORDER — NITROGLYCERIN 0.4 MG SL SUBL: 0.4000 mg | SUBLINGUAL_TABLET | SUBLINGUAL | 12 refills | Status: DC | PRN

## 2016-07-10 MED ORDER — LISINOPRIL 10 MG PO TABS: 10.0000 mg | ORAL_TABLET | Freq: Every day | ORAL | 2 refills | Status: DC

## 2016-07-10 MED ORDER — METOPROLOL SUCCINATE ER 50 MG PO TB24: 50.0000 mg | ORAL_TABLET | Freq: Every day | ORAL | 2 refills | Status: DC

## 2016-07-10 MED ORDER — RIVAROXABAN 20 MG PO TABS: 20.0000 mg | ORAL_TABLET | Freq: Every day | ORAL | 4 refills | Status: DC

## 2016-07-10 NOTE — Progress Notes (Signed)
CARDIAC REHAB PHASE I   PRE:  Rate/Rhythm: 76 ?JR  BP:  Supine:   Sitting: 119/73  Standing:    SaO2: 98%RA  MODE:  Ambulation: 640 ft   POST:  Rate/Rhythm: 90  BP:  Supine:   Sitting: 136/92  Standing:    SaO2: 9&%RA hall [phone] Waited for pt to use bathroom and then checked vitals and walked 640 ft. Pt had to stop twice to rest. DOE but sats stable on RA. No c/o CP. Discussed with pt that he is now going to be on plavix instead of brillinta. Pt asked ways to prevent afib. Encouraged him to watch caffeine, alcohol and take his meds. He needs to see case manager re Xarelto. RN stated he has case manager consult.   Graylon Good, RN BSN  07/10/2016 9:16 AM

## 2016-07-10 NOTE — Discharge Summary (Signed)
Discharge Summary    Patient ID: Chad Ellis,  MRN: [id number], DOB/AGE: 08/30/1960 56 y.o.  Admit date: 07/06/2016 Discharge date: 07/10/2016  Primary Care Provider: [facility] Primary Cardiologist: Dr. Martinique / Dr. 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 male with a history of HTN and no previous cardiac history who presented to [facility] with chest pain and dyspnea. He was found to have afib with RVR and elevated troponin and was transferred to [facility] for further work up and treatment.   He presented to [facility] with 1 week history of SOB and orthopnea. He started having CP 45/25/18 around 4 pm on and off associated with palpitation. It would relieve with burping so he took alketzer which improved it but then he started hurting again so he went to the ED. In the ED, he was found to have afib with RVR and elevated troponin so he was transferred to [facility].   Hospital Course     Consultants: none  CAD/NSTEMI: Admitted with NSTEMI (peak troponin ~6) and found to have a severe proximal RCA stenosis treated with thrombectomy and DES. He was initially placed on Brilinta, but this was changed to plavix given ongoing afib and need for Xarelto. He is now on ASA, Plavix, statin, beta blocker, Imdur. LV function is normal post cath. Plan will be to continue ASA/Plavix/Xarelto for 1 month and then drop ASA.   Atrial fib, paroxysmal: He was in atrial fib at time of presentation but converted to sinus. He went back in to atrial fib with RVR and was seen by  [facility]. He is now on Xarelto and Sotalol per Dr. Rayann Heman. CHADSVASC score at least 2 (HTN, vasc dz). 2D ECHO showed normal LV function with mild LVH, basal inferior HK, mildly dilated RV with mild-mod RV dysfunction, free wall hypokinetic compared to apex. He will need close follow up in the atrial fib clinic.    HTN: BP stable this am.   HLD: LDL 105. He is now on high dose statin.    Acute on chronic diastolic CHF: He has no evidence of volume overload on exam. CT chest with small pleural effusions 07/08/16 and was treated with IV Lasix. No dyspnea this am. Resume home HCTZ  Acute pericarditis post MI: he continued to have pleuritic chest pain after cath. This improved with the addition of colchicine 0.6mg  BID. Will continue this for 2 weeks.   Ectopic atrial rhythm: this appears to be his underlying rhythm with inverted P waves. Rate is well controlled on sotalol and Toprol XL   The patient has had an uncomplicated hospital course and is recovering well. The radial  catheter site is stable. He has been seen by Dr. Julianne Handler today and deemed ready for discharge home. All follow-up appointments have been scheduled. Discharge medications are listed below.  _____________  Discharge Vitals Blood pressure 105/72, pulse 69, temperature 98.6 F (37 C), temperature source Oral, resp. rate 18, height 6\' 1"  (1.854 m), weight 276 lb 6.4 oz (125.4 kg), SpO2 97 %.  Filed  Weights   07/07/16 0400 07/09/16 0151 07/10/16 0604  Weight: 285 lb 7.9 oz (129.5 kg) 276 lb (125.2 kg) 276 lb 6.4 oz (125.4 kg)    Labs & Radiologic Studies     CBC  Recent Labs  07/09/16 0223 07/10/16 0208  WBC 7.4 7.3  HGB 11.1* 11.0*  HCT 33.3* 33.6*  MCV 94.9 94.4  PLT 179 564   Basic Metabolic Panel  Recent Labs  07/09/16 0939 07/10/16 0208  NA 137 139  K 3.5 3.8  CL 101 105  CO2 26 27  GLUCOSE 116* 91  BUN 10 10  CREATININE 1.00 0.95  CALCIUM 8.7* 8.4*  MG  --  2.1   Liver Function Tests No  results for input(s): AST, ALT, ALKPHOS, BILITOT, PROT, ALBUMIN in the last 72 hours. No results for input(s): LIPASE, AMYLASE in the last 72 hours. Cardiac Enzymes No results for input(s): CKTOTAL, CKMB, CKMBINDEX, TROPONINI in the last 72 hours. BNP Invalid input(s): POCBNP D-Dimer No results for input(s): DDIMER in the last 72 hours. Hemoglobin A1C No results for input(s): HGBA1C in the last 72 hours. Fasting Lipid Panel No results for input(s): CHOL, HDL, LDLCALC, TRIG, CHOLHDL, LDLDIRECT in the last 72 hours. Thyroid Function Tests No results for input(s): TSH, T4TOTAL, T3FREE, THYROIDAB in the last 72 hours.  Invalid input(s): FREET3  Ct Angio Chest Pe W Or Wo Contrast  Result Date: 07/08/2016 CLINICAL DATA:  Chest pain and shortness of breath EXAM: CT ANGIOGRAPHY CHEST WITH CONTRAST TECHNIQUE: Multidetector CT imaging of the chest was performed using the standard protocol during bolus administration of intravenous contrast. Multiplanar CT image reconstructions and MIPs were obtained to evaluate the vascular anatomy. CONTRAST:  80 mL Isovue 370 nonionic COMPARISON:  Chest CT July 05, 2016 FINDINGS: Cardiovascular: There is no demonstrable pulmonary embolus. There is no appreciable thoracic aortic aneurysm or dissection. There is minimal calcification in the proximal left subclavian artery. Other visualized great vessels appear unremarkable. The left and right common carotid arteries arise as a common trunk, an anatomic variant. There are scattered foci of coronary artery calcification. There is mild atherosclerotic calcification in the aorta. Pericardium is not appreciably thickened. Mediastinum/Nodes: Visualized thyroid appears normal. There is no appreciable thoracic adenopathy. There is a small hiatal hernia. Lungs/Pleura: There are new small pleural effusions bilaterally. There is no edema or consolidation. Upper Abdomen: There is slight reflux of contrast into the inferior vena cava.  Visualized upper abdominal structures otherwise appear unremarkable except for scattered foci of arterial vascular calcification. Musculoskeletal: There is degenerative change in the lower thoracic region. There are no blastic or lytic bone lesions. Review of the MIP images confirms the above findings. IMPRESSION: No demonstrable pulmonary embolus. Scattered foci of atherosclerotic calcification and foci of coronary artery calcification. Small pleural effusions, new from recent study. Lungs elsewhere clear. No adenopathy. Mild reflux of contrast into the inferior vena cava may indicate a degree of increased right heart pressure. Electronically Signed   By: Lowella Grip III M.D.   On: 07/08/2016 10:44     Diagnostic Studies/Procedures    LHC: 07/06/16 Conclusion   1st Mrg lesion, 20 %stenosed.  Mid LAD lesion, 20 %stenosed.  A STENT SYNERGY DES [id number] drug eluting stent was successfully placed.  Prox RCA to Mid RCA lesion, 100 %stenosed.  Post intervention, there is a 0% residual stenosis.  The left ventricular ejection fraction is 45-50% by visual estimate.  There is mild left ventricular systolic dysfunction.  Acute/subacute very proximal RCA  occlusion with extensive thrombus burden and TIMI 0 flow. Mild nonobstructive concomitant CAD with 20% LAD stenosis and 20% circumflex narrowing and a large left circumflex vessel. There was a hint of very minimal if any distal collateralization of the PLA vessel from the RCA via the left injection. Difficult but successful PCI to the totally occluded RCA with extensive thrombus requiring thrombectomy, PTCA, and ultimate DES stenting with a 3.538 mm Synergy DES stent postdilated to 4.14 mm very proximally and 3.9-4.0 mm distally in a very large RCA that supplies the PDA and PLA vessel. RECOMMENDATION: Dual antiplatelet therapy. The patient will continue on Aggrastat for 18 hours post procedure. The patient will be started on high potency  statin therapy, and medical therapy post MI including ACE-I/ARB, and beta blockerRx.   _____________   2D ECHO: 07/08/2016 LV EF: 55% - 60% Study 00    Conclusions - Left ventricle: The cavity size was normal. Wall thickness was increased in a pattern of mild LVH. Systolic function was c. The estimated ejection fraction was in the range of 55% to 60%. Basal inferior akinesis. Doppler parameters are consistent with abnormal left ventricular relaxation (grade 1 diastolic dysfunction).[phone] - Aortic valve: There was no stenosis. - Aorta: Mildly dilated aortic root. Aortic root dimension: 38 mm (ED). - Mitral valve: There was trivial regurgitation. - Left atrium: The atrium was mildly dilated. - Right ventricle: Mildly dilated RV with mild to moderately decreased systolic function. The free wall is hypokinetic compared to the apex. No PE on CTA, would be concerned for RV infarction. - Right atrium: The atrium was mildly dilated. - Pulmonary arteries: No complete TR doppler jet so unable to estimate PA systolic pressure. - Systemic veins: IVC measured 2.4 cm with > 50% respirophasic variation, suggesting RA pressure 8 mmHg. Impressions: - Normal LV size with mild LV hypertrophy. EF 55-60%, basal inferior akinesis. The RV was mildly dilated with mild to moderate systolic dysfunction. Free wall was hypokinetic compared to apex, in absence of PE would consider RV infarction. No significant valvular abnormalities.    Disposition   Pt is being discharged home today in good condition.  Follow-up Plans & Appointments    Follow-up [facility]. Call.   Why:  [phone] Contact information: Please call for post hospital discharge appt to establish a Primary Care Provider       [facility]. Call.   Why:  Please call for primary care provider assistance and request  medication assistance for Brilinta Contact information: [place] [address] [phone]       CARROLL,DONNA, NP. Go on 07/14/2016.   Specialties:  Nurse Practitioner, Cardiology Why:  @10 :30 am.Garage code [phone]. Contact information: [place] [address] [phone]        Angelena Form, PA-C. Go on 07/24/2016.   Specialties:  Cardiology, Radiology Why:  @ 10am, please arrive at least 10 minutes early.  Contact information: [address] [place]  [address] [phone]          Discharge Instructions    Amb Referral to Cardiac Rehabilitation    Complete by:  As directed    Diagnosis:   Coronary Stents NSTEMI        Discharge Medications     Medication List    STOP taking these medications   ibuprofen 800 MG tablet Commonly known as:  ADVIL,MOTRIN   naproxen 500 MG tablet Commonly  known as:  NAPROSYN   naproxen sodium 220 MG tablet Commonly known as:  ANAPROX   promethazine 12.5 MG tablet Commonly known as:  PHENERGAN     TAKE these medications   acetaminophen 500 MG tablet Commonly known as:  TYLENOL Take 500 mg by mouth every 6 (six) hours as needed for moderate pain or headache.   albuterol 108 (90 Base) MCG/ACT inhaler Commonly known as:  PROVENTIL HFA;VENTOLIN HFA Inhale 1-2 puffs into the lungs every 6 (six) hours as needed for wheezing or shortness of breath.   ALPRAZolam 1 MG tablet Commonly known as:  XANAX Take 1 mg by mouth 3 (three) times daily as needed for anxiety or sleep.   aspirin 81 MG chewable tablet Chew 1 tablet (81 mg total) by mouth daily.   atorvastatin 80 MG tablet Commonly known as:  LIPITOR Take 1 tablet (80 mg total) by mouth daily at 6 PM.   clopidogrel 75 MG tablet Commonly known as:  PLAVIX Take 1 tablet (75 mg total) by mouth daily.   colchicine 0.6 MG tablet Take 1 tablet (0.6 mg total) by mouth 2 (two) times daily.     hydrochlorothiazide 25 MG tablet Commonly known as:  HYDRODIURIL Take 25 mg by mouth daily as needed (for fluid).   HYDROcodone-acetaminophen 5-325 MG tablet Commonly known as:  NORCO Take 1 tablet by mouth every 6 (six) hours as needed for pain.   isosorbide mononitrate 30 MG 24 hr tablet Commonly known as:  IMDUR Take 1 tablet (30 mg total) by mouth daily.   lisinopril 10 MG tablet Commonly known as:  PRINIVIL,ZESTRIL Take 1 tablet (10 mg total) by mouth daily.   metoprolol succinate 50 MG 24 hr tablet Commonly known as:  TOPROL-XL Take 1 tablet (50 mg total) by mouth daily. Take with or immediately following a meal.   nitroGLYCERIN 0.4 MG SL tablet Commonly known as:  NITROSTAT Place 1 tablet (0.4 mg total) under the tongue every 5 (five) minutes as needed for chest pain.   oxyCODONE-acetaminophen 5-325 MG tablet Commonly known as:  PERCOCET/ROXICET Take 1 tablet by mouth every 6 (six) hours as needed for severe pain.   rivaroxaban 20 MG Tabs tablet Commonly known as:  XARELTO Take 1 tablet (20 mg total) by mouth daily with supper.   sotalol 120 MG tablet Commonly known as:  BETAPACE Take 1 tablet (120 mg total) by mouth every 12 (twelve) hours.       Aspirin prescribed at discharge?  Yes High Intensity Statin Prescribed? (Lipitor 40-80mg  or Crestor 20-40mg ): Yes Beta Blocker Prescribed? Yes For EF 45% or less, Was ACEI/ARB Prescribed? Yes, EF nl ADP Receptor Inhibitor Prescribed? (i.e. Plavix etc.-Includes Medically Managed Patients): Yes For EF <45%, Aldosterone Inhibitor Prescribed? No: EF nl Was EF assessed during THIS hospitalization? Yes Was Cardiac Rehab II ordered? (Included Medically managed Patients): Yes   Outstanding Labs/Studies   none  Duration of Discharge Encounter   Greater than 30 minutes including physician time.  Signed, Angelena Form PA-C 07/10/2016, 9:47 AM   See my full note this am.   Lauree Chandler 07/10/2016 9:51  AM

## 2016-07-10 NOTE — Care Management Note (Signed)
Case Management Note Previous CM note initiated by Maryclare Labrador, RN 07/07/2016, 10:12 AM   Patient Details  Name: Chad Ellis MRN: [id number] Date of Birth: 1960-12-18  Subjective/Objective:  Pt admitted with dyspnea and CP - now s/p cath                  Action/Plan:   PTA from home independent.  Pt confirmed that he does not have insurance nor PCP - pt interested in both [facility] free clinics and [facility] - CM provided information on both.  CM also provided pt with free 30 day Brilinta card and urged pt to call Brilinta assistance number and request medication cost assistance while here in hospital. CM informed pt that it is imperative for him to establish PCP relationship to secure medication assistance - CM was unable to schedule appt with [facility]   Expected Discharge Date:  07/10/16               Expected Discharge Plan:  Home/Self Care  In-House Referral:     Discharge planning Services  CM Consult, Medication Assistance, [facility], [facility] Program  Post Acute Care Choice:  NA Choice offered to:  NA  DME Arranged:    DME Agency:     HH Arranged:    [place] Agency:     Status of Service:  Completed, signed off  If discussed at [employer] of Stay Meetings, dates discussed:    Discharge Disposition: home/self care   Additional Comments:  07/10/16- 1000- Marigrace Mccole RN, CM- pt for d/c home today- referral for Xarelto needs received- pt was started on Brilinta- now stopped- plan for ASA/Plavix/Xarelto- pt given Xarelto 30 day free card for use on discharge along with pt application for assistance. Pt uses [facility] in [place]- he will check with them for pricing of his new meds for knowledge of what his out of pocket cost will be. Cm will assist pt with MATCH for discharge- pt given [place] letter along with list of pharmacies to use - understands one time use and $3 per prescription cost- also knows [facility] is not on list to use  at time of discharge. Pt states that he plans to f/u with Dr. Monico Blitz for now has his primary doctor along with cardiology. He has info on [facility] and [facility] if needed.   Dahlia Client [place], RN 07/10/2016, 10:22 AM [phone]

## 2016-07-10 NOTE — Progress Notes (Signed)
Patient Name: Chad Ellis Date of Encounter: 07/10/2016  Primary Cardiologist: Dr. Martinique   Hospital Problem List     Principal Problem:   Unstable angina [facility]) Active Problems:   Atrial fibrillation with RVR (HCC)   Dyspnea on exertion   Essential hypertension   Non-ST elevation (NSTEMI) myocardial infarction [facility])   Chest pain    Subjective   Feels better. No chest pain. Dyspnea improved.   Inpatient Medications    Scheduled Meds: . aspirin  81 mg Oral Daily  . atorvastatin  80 mg Oral q1800  . clopidogrel  75 mg Oral Daily  . colchicine  0.6 mg Oral BID  . isosorbide mononitrate  30 mg Oral Daily  . lisinopril  10 mg Oral Daily  . metoprolol succinate  50 mg Oral Daily  . rivaroxaban  20 mg Oral Q supper  . sodium chloride flush  3 mL Intravenous Q12H  . sodium chloride flush  3 mL Intravenous Q12H  . sotalol  120 mg Oral Q12H   Continuous Infusions: . sodium chloride     PRN Meds: sodium chloride, acetaminophen, albuterol, ALPRAZolam, alum & mag hydroxide-simeth, diazepam, nitroGLYCERIN, ondansetron (ZOFRAN) IV, oxyCODONE-acetaminophen, promethazine, sodium chloride flush   Vital Signs    Vitals:   07/09/16 0946 07/09/16 1319 07/09/16 1924 07/10/16 0604  BP: 118/86 108/73 (!) 98/58 105/72  Pulse: 95 (!) 121 77 69  Resp:   18 18  Temp:  98.6 F (37 C) 98.6 F (37 C) 98.6 F (37 C)  TempSrc:  Oral Oral Oral  SpO2:  94% 97% 97%  Weight:    276 lb 6.4 oz (125.4 kg)  Height:        Intake/Output Summary (Last 24 hours) at 07/10/16 0830 Last data filed at 07/10/16 0800  Gross per 24 hour  Intake              363 ml  Output             2125 ml  Net            -1762 ml   Filed Weights   07/07/16 0400 07/09/16 0151 07/10/16 0604  Weight: 285 lb 7.9 oz (129.5 kg) 276 lb (125.2 kg) 276 lb 6.4 oz (125.4 kg)    Physical Exam    General: Well developed, well nourished, NAD  HEENT: OP clear, mucus membranes moist  SKIN: warm, dry. No  rashes. Neuro: No focal deficits  Musculoskeletal: Muscle strength 5/5 all ext  Psychiatric: Mood and affect normal  Neck: No JVD, no carotid bruits, no thyromegaly, no lymphadenopathy.  Lungs:Clear bilaterally, no wheezes, rhonci. Faint left lower lobe crackles Cardiovascular: Regular rate and rhythm. No murmurs, gallops or rubs. Abdomen:Soft. Bowel sounds present. Non-tender.  Extremities: No lower extremity edema. Pulses are 2 + in the bilateral DP/PT.    Labs    CBC  Recent Labs  07/09/16 0223 07/10/16 0208  WBC 7.4 7.3  HGB 11.1* 11.0*  HCT 33.3* 33.6*  MCV 94.9 94.4  PLT 179 532   Basic Metabolic Panel  Recent Labs  07/09/16 0939 07/10/16 0208  NA 137 139  K 3.5 3.8  CL 101 105  CO2 26 27  GLUCOSE 116* 91  BUN 10 10  CREATININE 1.00 0.95  CALCIUM 8.7* 8.4*  MG  --  2.1   Telemetry    Regular, narrow complex. Likely junctional   ECG    n/a  Radiology    Ct Angio Chest  Pe W Or Wo Contrast  Result Date: 07/08/2016 CLINICAL DATA:  Chest pain and shortness of breath EXAM: CT ANGIOGRAPHY CHEST WITH CONTRAST TECHNIQUE: Multidetector CT imaging of the chest was performed using the standard protocol during bolus administration of intravenous contrast. Multiplanar CT image reconstructions and MIPs were obtained to evaluate the vascular anatomy. CONTRAST:  80 mL Isovue 370 nonionic COMPARISON:  Chest CT July 05, 2016 FINDINGS: Cardiovascular: There is no demonstrable pulmonary embolus. There is no appreciable thoracic aortic aneurysm or dissection. There is minimal calcification in the proximal left subclavian artery. Other visualized great vessels appear unremarkable. The left and right common carotid arteries arise as a common trunk, an anatomic variant. There are scattered foci of coronary artery calcification. There is mild atherosclerotic calcification in the aorta. Pericardium is not appreciably thickened. Mediastinum/Nodes: Visualized thyroid appears normal.  There is no appreciable thoracic adenopathy. There is a small hiatal hernia. Lungs/Pleura: There are new small pleural effusions bilaterally. There is no edema or consolidation. Upper Abdomen: There is slight reflux of contrast into the inferior vena cava. Visualized upper abdominal structures otherwise appear unremarkable except for scattered foci of arterial vascular calcification. Musculoskeletal: There is degenerative change in the lower thoracic region. There are no blastic or lytic bone lesions. Review of the MIP images confirms the above findings. IMPRESSION: No demonstrable pulmonary embolus. Scattered foci of atherosclerotic calcification and foci of coronary artery calcification. Small pleural effusions, new from recent study. Lungs elsewhere clear. No adenopathy. Mild reflux of contrast into the inferior vena cava may indicate a degree of increased right heart pressure. Electronically Signed   By: Lowella Grip III M.D.   On: 07/08/2016 10:44    Cardiac Studies   LHC: 07/06/16 Conclusion   1st Mrg lesion, 20 %stenosed.  Mid LAD lesion, 20 %stenosed.  A STENT SYNERGY DES [id number] drug eluting stent was successfully placed.  Prox RCA to Mid RCA lesion, 100 %stenosed.  Post intervention, there is a 0% residual stenosis.  The left ventricular ejection fraction is 45-50% by visual estimate.  There is mild left ventricular systolic dysfunction.  Acute/subacute very proximal RCA occlusion with extensive thrombus burden and TIMI 0 flow.  Mild nonobstructive concomitant CAD with 20% LAD stenosis and 20% circumflex narrowing and a large left circumflex vessel. There was a hint of very minimal if any distal collateralization of the PLA vessel from the RCA via the left injection.  Difficult but successful PCI to the totally occluded RCA with extensive thrombus requiring thrombectomy, PTCA, and ultimate DES stenting with a 3.538 mm Synergy DES stent postdilated to 4.14 mm very  proximally and 3.9-4.0 mm distally in a very large RCA that supplies the PDA and PLA vessel.  RECOMMENDATION: Dual antiplatelet therapy. The patient will continue on Aggrastat for 18 hours post procedure. The patient will be started on high potency statin therapy, and medical therapy post MI including ACE-I/ARB, and beta blockerRx.    2D ECHO: 07/08/2016 LV EF: 55% -   60% Study 00    Conclusions - Left ventricle: The cavity size was normal. Wall thickness was   increased in a pattern of mild LVH. Systolic function was c.   The estimated ejection fraction was in the range of 55% to 60%.   Basal inferior akinesis. Doppler parameters are consistent with   abnormal left ventricular relaxation (grade 1 diastolic   dysfunction).[phone] - Aortic valve: There was no stenosis. - Aorta: Mildly dilated aortic root. Aortic root dimension: 38 mm   (ED). -  Mitral valve: There was trivial regurgitation. - Left atrium: The atrium was mildly dilated. - Right ventricle: Mildly dilated RV with mild to moderately   decreased systolic function. The free wall is hypokinetic   compared to the apex. No PE on CTA, would be concerned for RV   infarction. - Right atrium: The atrium was mildly dilated. - Pulmonary arteries: No complete TR doppler jet so unable to   estimate PA systolic pressure. - Systemic veins: IVC measured 2.4 cm with > 50% respirophasic   variation, suggesting RA pressure 8 mmHg. Impressions: - Normal LV size with mild LV hypertrophy. EF 55-60%, basal   inferior akinesis. The RV was mildly dilated with mild to   moderate systolic dysfunction. Free wall was hypokinetic compared   to apex, in absence of PE would consider RV infarction. No   significant valvular abnormalities.    Patient Profile     56 yo male with history of HTN who was admitted with chest pain and dyspnea on 07/06/16 and was found to have atrial fibrillation and elevated troponin. Cardiac cath 07/06/16  with severe stenosis proximal RCA treated with a drug eluting stent.   Assessment & Plan    1. CAD/NSTEMI: Admitted with NSTEMI and found to have a severe proximal RCA stenosis treated with a drug eluting stent. He is now on ASA, Plavix, statin, beta blocker, Imdur. LV function is normal post cath.   2. Atrial fib, paroxysmal: He was in atrial fib at time of presentation but converted to sinus. He went back in to atrial fib yesterday and was seen by Allred. He is now on Xarelto and Sotalol per Dr. Rayann Heman. He will need close follow up in the atrial fib clinic.    3. HTN: BP stable this am.   4. HLD: He is on high dose statin.    5. Acute on chronic diastolic CHF: He has no evidence of volume overload on exam. CT chest with small pleural effusions 07/08/16 and was treated with IV Lasix. No dyspnea this am. Resume home diuretic  6. Acute pericarditis post MI: continue colchicine  Discharge home today. Follow up atrial fib clinic and Dr. Martinique  Jandel Patriarca [facility] 07/10/2016 8:52 AM

## 2016-07-10 NOTE — Progress Notes (Signed)
Discharge instructions printed and reviewed with patient and copy given for them to take home. All questions addressed at this time. New prescriptions reviewed. IV and tele box removed. Room searched for patient belongings, and confirmed with patient that all valuables were accounted for. Awaiting family to arrive to transport patient home. Will continue to monitor.

## 2016-07-11 ENCOUNTER — Telehealth: Payer: Self-pay | Admitting: Physician Assistant

## 2016-07-11 NOTE — Telephone Encounter (Signed)
New Message  Appt scheduled with Nell Range for 5/14   *STAT* If patient is at the pharmacy, call can be transferred to refill team.   1. Which medications need to be refilled? (please list name of each medication and dose if known) oxyCODONE-acetaminophen (PERCOCET/ROXICET) 5-325 MG tablet  lisinopril (PRINIVIL,ZESTRIL) 10 MG tablet ALPRAZolam (XANAX) 1 MG tablet 2. Which pharmacy/location (including street and city if local pharmacy) is medication to be sent to? [place]  3. Do they need a 30 day or 90 day supply? 30 day for all three

## 2016-07-11 NOTE — Telephone Encounter (Signed)
Spoke with patient and he stated that he was able to get the lisinopril refill that was sent in yesterday transferred to [place] and filled. I made him aware that we do not refill alprazolam or oxycodone as these are not cardiac meds. He stated that his girlfriend was on another line with his PCP and they will request these refills from their office. Patient appreciative of my return call.

## 2016-07-14 ENCOUNTER — Encounter (HOSPITAL_COMMUNITY): Payer: Self-pay | Admitting: Nurse Practitioner

## 2016-07-14 ENCOUNTER — Ambulatory Visit (HOSPITAL_COMMUNITY)
Admission: RE | Admit: 2016-07-14 | Discharge: 2016-07-14 | Disposition: A | Discharge status: Home / Self Care | Payer: Self-pay | Source: Ambulatory Visit | Attending: Nurse Practitioner | Admitting: Nurse Practitioner

## 2016-07-14 VITALS — BP 102/62 | HR 65 | Ht 73.0 in | Wt 283.4 lb

## 2016-07-14 DIAGNOSIS — I10 Essential (primary) hypertension: Secondary | ICD-10-CM | POA: Insufficient documentation

## 2016-07-14 DIAGNOSIS — Z79899 Other long term (current) drug therapy: Secondary | ICD-10-CM | POA: Insufficient documentation

## 2016-07-14 DIAGNOSIS — Z6837 Body mass index (BMI) 37.0-37.9, adult: Secondary | ICD-10-CM | POA: Insufficient documentation

## 2016-07-14 DIAGNOSIS — Z79891 Long term (current) use of opiate analgesic: Secondary | ICD-10-CM | POA: Insufficient documentation

## 2016-07-14 DIAGNOSIS — E669 Obesity, unspecified: Secondary | ICD-10-CM | POA: Insufficient documentation

## 2016-07-14 DIAGNOSIS — I480 Paroxysmal atrial fibrillation: Secondary | ICD-10-CM | POA: Insufficient documentation

## 2016-07-14 DIAGNOSIS — Z8249 Family history of ischemic heart disease and other diseases of the circulatory system: Secondary | ICD-10-CM | POA: Insufficient documentation

## 2016-07-14 DIAGNOSIS — I2510 Atherosclerotic heart disease of native coronary artery without angina pectoris: Secondary | ICD-10-CM | POA: Insufficient documentation

## 2016-07-14 DIAGNOSIS — Z833 Family history of diabetes mellitus: Secondary | ICD-10-CM | POA: Insufficient documentation

## 2016-07-14 LAB — BASIC METABOLIC PANEL
Anion gap: 10 (ref 5–15)
BUN: 11 mg/dL (ref 6–20)
CHLORIDE: 108 mmol/L (ref 101–111)
CO2: 21 mmol/L — ABNORMAL LOW (ref 22–32)
CREATININE: 0.86 mg/dL (ref 0.61–1.24)
Calcium: 8.5 mg/dL — ABNORMAL LOW (ref 8.9–10.3)
Glucose, Bld: 101 mg/dL — ABNORMAL HIGH (ref 65–99)
POTASSIUM: 4.5 mmol/L (ref 3.5–5.1)
SODIUM: 139 mmol/L (ref 135–145)

## 2016-07-14 LAB — MAGNESIUM: Magnesium: 2 mg/dL (ref 1.7–2.4)

## 2016-07-14 NOTE — Progress Notes (Signed)
Primary Care Physician: Monico Blitz, MD Referring Physician: Dr. Rayann Heman Cardiologist:Dr. Carrie Mew Chad Ellis is a 56 y.o. male with a h/o HTN and no previous cardiac history who presented to [facility] with chest pain and dyspnea. He was found to have afib with RVR and elevated troponin and was transferred to [facility] for further work up and treatment.    Admitted with NSTEMI (peak troponin ~6) and found to have a severe proximal RCA stenosis treated with thrombectomy and DES. He was initially placed on Brilinta, but this was changed to plavix given ongoing afib and need for Xarelto. He is now on ASA, Plavix, statin, beta blocker, Imdur. LV function is normal post cath. Plan will be to continue ASA/Plavix/Xarelto for 1 month and then drop ASA.    He was in atrial fib at time of presentation but converted to sinus. He went back in to atrial fib with RVR and was seen by Allred. He is now on Xarelto and Sotalol per Dr. Rayann Heman. CHADSVASC score at least 2 (HTN, vasc dz). 2D ECHO showed normal LV function with mild LVH, basal inferior HK, mildly dilated RV with mild-mod RV dysfunction, free wall hypokinetic compared to apex.   He is in the clinic today to f/u hospitalization. He hs been doing well. No further chest pain or irregular heart beat. He is being compliant with DOAC/antiplt drugs for MI/stent.No bleeding issues.  Today, he denies symptoms of palpitations, chest pain, shortness of breath, orthopnea, PND, lower extremity edema, dizziness, presyncope, syncope, or neurologic sequela. The patient is tolerating medications without difficulties and is otherwise without complaint today.   Past Medical History:  Diagnosis Date  . Anxiety   . Coronary artery disease    a. 06/2016: NSTEMI s/p DES to RCA  . Diverticulosis   . 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)   . Ectopic atrial rhythm   . HLD (hyperlipidemia)   . HTN (hypertension)   . PAF (paroxysmal atrial fibrillation) ([place])    a.  06/2016: Dx'd and started on Sotolol and Xarelto   Past Surgical History:  Procedure Laterality Date  . CHONDROPLASTY Right 05/10/2012   Procedure: CHONDROPLASTY;  Surgeon: Carole Civil, MD;  Location: [facility];  Service: Orthopedics;  Laterality: Right;  of patella  . COLONOSCOPY    . CORONARY STENT INTERVENTION N/A 07/06/2016   Procedure: Coronary Stent Intervention;  Surgeon: Troy Sine, MD;  Location: [place] [facility];  Service: Cardiovascular;  Laterality: N/A;  . ESOPHAGOGASTRODUODENOSCOPY    . EXAM UNDER ANESTHESIA WITH MANIPULATION OF KNEE Right 05/10/2012   Procedure: EXAM UNDER ANESTHESIA WITH MANIPULATION OF KNEE;  Surgeon: Carole Civil, MD;  Location: [facility];  Service: Orthopedics;  Laterality: Right;  start 0750 end 751  . KNEE ARTHROSCOPY WITH EXCISION PLICA Right 2/83/1517   Procedure: KNEE ARTHROSCOPY WITH EXCISION PLICA;  Surgeon: Carole Civil, MD;  Location: [facility];  Service: Orthopedics;  Laterality: Right;  . KNEE ARTHROSCOPY WITH MEDIAL MENISECTOMY Right 05/10/2012   Procedure: KNEE ARTHROSCOPY WITH MEDIAL MENISECTOMY;  Surgeon: Carole Civil, MD;  Location: [facility];  Service: Orthopedics;  Laterality: Right;  . LEFT HEART CATH AND CORONARY ANGIOGRAPHY N/A 07/06/2016   Procedure: Left Heart Cath and Coronary Angiography;  Surgeon: Troy Sine, MD;  Location: [place] [facility];  Service: Cardiovascular;  Laterality: N/A;    Current Outpatient Prescriptions  Medication Sig Dispense Refill  . acetaminophen (TYLENOL) 500 MG tablet Take 500 mg by mouth every 6 (  six) hours as needed for moderate pain or headache.    . albuterol (PROVENTIL HFA;VENTOLIN HFA) 108 (90 Base) MCG/ACT inhaler Inhale 1-2 puffs into the lungs every 6 (six) hours as needed for wheezing or shortness of breath.    . ALPRAZolam (XANAX) 1 MG tablet Take 1 mg by mouth 3 (three) times daily as needed for anxiety or sleep.     Marland Kitchen aspirin 81 MG chewable tablet Chew 1 tablet (81 mg total)  by mouth daily.    Marland Kitchen atorvastatin (LIPITOR) 80 MG tablet Take 1 tablet (80 mg total) by mouth daily at 6 PM. 90 tablet 1  . clopidogrel (PLAVIX) 75 MG tablet Take 1 tablet (75 mg total) by mouth daily. 90 tablet 4  . colchicine 0.6 MG tablet Take 1 tablet (0.6 mg total) by mouth 2 (two) times daily. 14 tablet 0  . hydrochlorothiazide (HYDRODIURIL) 25 MG tablet Take 25 mg by mouth daily as needed (for fluid).     Marland Kitchen HYDROcodone-acetaminophen (NORCO) 5-325 MG per tablet Take 1 tablet by mouth every 6 (six) hours as needed for pain. 60 tablet 1  . isosorbide mononitrate (IMDUR) 30 MG 24 hr tablet Take 1 tablet (30 mg total) by mouth daily. 90 tablet 2  . lisinopril (PRINIVIL,ZESTRIL) 10 MG tablet Take 1 tablet (10 mg total) by mouth daily. 90 tablet 2  . metoprolol succinate (TOPROL-XL) 50 MG 24 hr tablet Take 1 tablet (50 mg total) by mouth daily. Take with or immediately following a meal. 90 tablet 2  . nitroGLYCERIN (NITROSTAT) 0.4 MG SL tablet Place 1 tablet (0.4 mg total) under the tongue every 5 (five) minutes as needed for chest pain. 25 tablet 12  . oxyCODONE-acetaminophen (PERCOCET/ROXICET) 5-325 MG tablet Take 1 tablet by mouth every 6 (six) hours as needed for severe pain. 5 tablet 0  . rivaroxaban (XARELTO) 20 MG TABS tablet Take 1 tablet (20 mg total) by mouth daily with supper. 90 tablet 4  . sotalol (BETAPACE) 120 MG tablet Take 1 tablet (120 mg total) by mouth every 12 (twelve) hours. 60 tablet 6   No current facility-administered medications for this encounter.     No Known Allergies  Social History   Social History  . Marital status: Divorced    Spouse name: N/A  . Number of children: N/A  . Years of education: N/A   Occupational History  . Not on file.   Social History Main Topics  . Smoking status: Never Smoker  . Smokeless tobacco: Never Used  . Alcohol use Yes     Comment: almost daily use of brandy or beer  . Drug use: No  . Sexual activity: Not on file    Other Topics Concern  . Not on file   Social History Narrative  . No narrative on file    Family History  Problem Relation Age of Onset  . Atrial fibrillation Mother   . COPD Father   . Mesothelioma Father   . Diabetes Father   . Cholecystitis Sister     ROS- All systems are reviewed and negative except as per the HPI above  Physical Exam: Vitals:   07/14/16 1034  BP: 102/62  Pulse: 65  Weight: 283 lb 6.4 oz (128.5 kg)  Height: 6\' 1"  (1.854 m)   Wt Readings from Last 3 Encounters:  07/14/16 283 lb 6.4 oz (128.5 kg)  07/10/16 276 lb 6.4 oz (125.4 kg)  11/05/12 260 lb (117.9 kg)    Labs: Lab Results  Component Value Date   NA 139 07/10/2016   K 3.8 07/10/2016   CL 105 07/10/2016   CO2 27 07/10/2016   GLUCOSE 91 07/10/2016   BUN 10 07/10/2016   CREATININE 0.95 07/10/2016   CALCIUM 8.4 (L) 07/10/2016   MG 2.1 07/10/2016   Lab Results  Component Value Date   INR 1.08 07/06/2016   Lab Results  Component Value Date   CHOL 191 07/06/2016   HDL 23 (L) 07/06/2016   LDLCALC 105 (H) 07/06/2016   TRIG 316 (H) 07/06/2016     GEN- The patient is well appearing, alert and oriented x 3 today.   Head- normocephalic, atraumatic Eyes-  Sclera clear, conjunctiva pink Ears- hearing intact Oropharynx- clear Neck- supple, no JVP Lymph- no cervical lymphadenopathy Lungs- Clear to ausculation bilaterally, normal work of breathing Heart- Regular rate and rhythm, no murmurs, rubs or gallops, PMI not laterally displaced GI- soft, NT, ND, + BS Extremities- no clubbing, cyanosis, or edema MS- no significant deformity or atrophy Skin- no rash or lesion Psych- euthymic mood, full affect Neuro- strength and sensation are intact  EKG-SR with sinus arrhythmia at 65 bpm, pr int 122 ms, qrs int 80 ms, qtc 449 ms(stable) Epic records reviewed    Assessment and Plan: 1. Paroxysmal afib Maintaining SR on sotalol 80 mg bid Continue metoprolol Continue xarelto bmet/mag  today  2. CAD Stable Continue asa/plavix Isosorbide Statin  3. Obesity Encouraged weight loss Encouraged regular exercise Currently snores but does not believe he has sleep apnea Weight loss should help  4. HTN Stable Lisinopril/HCTZ  f/u K. [place], [place] 5/14 afib clinc in 3 months  Geroge Baseman. Estera Ozier, [facility] [address] [place], [place] [address] [phone]

## 2016-07-20 ENCOUNTER — Encounter: Payer: Self-pay | Admitting: Physician Assistant

## 2016-07-21 NOTE — Progress Notes (Addendum)
Cardiology Office Note    Date:  07/24/2016   ID:  Chad Ellis, DOB 1960/11/29, MRN [id number]  PCP:  Hiram Comber, PA-C  Cardiologist: Dr. Martinique / Dr. Rayann Heman (EP)  CC: post hospital follow up.   History of Present Illness:  Chad Ellis is a 56 y.o. male with a history of HTN and recently diagnosed PAF of Sotolol and Xarelto and CAD s/p DES to RCA (06/2016) who presents to clinic for post hospital follow up.   He was recently admitted from 4/26-4/30/18. He initially presented to [facility] with CP, SOB and orthopnea. He was found to have afib with RVR and elevated troponin so he was transferred to [facility]. Troponin peaked at 6 and found to have a severe proximal RCA stenosis treated with thrombectomy and DES. 2D ECHO showed normal LV function with mild LVH, basal inferior HK, mildly dilated RV with mild-mod RV dysfunction, free wall hypokinetic compared to apex. He was initially placed on Brilinta, but this was changed to plavix given ongoing afib and need for triple therapy with Xarelto. Plan to stop ASA after 1 month. Dr. Rayann Heman saw patient and started him on Sotalol 120mg  BID. He also continued to have SOB and pleuritic chest pain. CT angio was negative for PE but did show small pleural effusions. He was diuresed with IV lasix with improvement. Chest pain improved with colchicine.   He saw Roderic Palau NP in the afib clinic on 07/14/16. He was maintaining NSR on sotolol. Follow up BMET and mag were normal.   Today he presents to clinic for follow up. He is feeling better. He gets occasional CP that goes away quickly with rest. He has taken some hydrocodone which helps. He feels like breathing is getting better. No LE edema, orthopnea or PND. He has been watching his diet recently and eating a lot of special K and chicken. No dizziness or syncope. No blood in stool or urine. He has been having diarrhea since 3 days before his admission that has been ongoing. Imodium has  been helping.   Past Medical History:  Diagnosis Date  . Anxiety   . Coronary artery disease    a. 06/2016: NSTEMI s/p DES to RCA  . Diverticulosis   . 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 Ectopic atrial rhythm   . HLD (hyperlipidemia)   . HTN (hypertension)   . PAF (paroxysmal atrial fibrillation) ([place])    a. 06/2016: Dx'd and started on Sotolol and Xarelto    Past Surgical History:  Procedure Laterality Date  . CHONDROPLASTY Right 05/10/2012   Procedure: CHONDROPLASTY;  Surgeon: Carole Civil, MD;  Location: [facility];  Service: Orthopedics;  Laterality: Right;  of patella  . COLONOSCOPY    . CORONARY STENT INTERVENTION N/A 07/06/2016   Procedure: Coronary Stent Intervention;  Surgeon: Troy Sine, MD;  Location: [place] [facility];  Service: Cardiovascular;  Laterality: N/A;  . ESOPHAGOGASTRODUODENOSCOPY    . EXAM UNDER ANESTHESIA WITH MANIPULATION OF KNEE Right 05/10/2012   Procedure: EXAM UNDER ANESTHESIA WITH MANIPULATION OF KNEE;  Surgeon: Carole Civil, MD;  Location: [facility];  Service: Orthopedics;  Laterality: Right;  start 0750 end 751  . KNEE ARTHROSCOPY WITH EXCISION PLICA Right 3/32/9518   Procedure: KNEE ARTHROSCOPY WITH EXCISION PLICA;  Surgeon: Carole Civil, MD;  Location: [facility];  Service: Orthopedics;  Laterality: Right;  . KNEE ARTHROSCOPY WITH MEDIAL MENISECTOMY Right 05/10/2012   Procedure: KNEE ARTHROSCOPY WITH MEDIAL MENISECTOMY;  Surgeon: Carole Civil, MD;  Location: [facility];  Service: Orthopedics;  Laterality: Right;  . LEFT HEART CATH AND CORONARY ANGIOGRAPHY N/A 07/06/2016   Procedure: Left Heart Cath and Coronary Angiography;  Surgeon: Troy Sine, MD;  Location: [place] [facility];  Service: Cardiovascular;  Laterality: N/A;    Current Medications: Outpatient Medications Prior to Visit  Medication Sig Dispense Refill  . acetaminophen (TYLENOL) 500 MG tablet Take 500 mg by mouth every 6 (six) hours as needed for moderate pain or headache.      . albuterol (PROVENTIL HFA;VENTOLIN HFA) 108 (90 Base) MCG/ACT inhaler Inhale 1-2 puffs into the lungs every 6 (six) hours as needed for wheezing or shortness of breath.    . ALPRAZolam (XANAX) 1 MG tablet Take 1 mg by mouth 3 (three) times daily as needed for anxiety or sleep.     Marland Kitchen aspirin 81 MG chewable tablet Chew 1 tablet (81 mg total) by mouth daily.    Marland Kitchen atorvastatin (LIPITOR) 80 MG tablet Take 1 tablet (80 mg total) by mouth daily at 6 PM. 90 tablet 1  . clopidogrel (PLAVIX) 75 MG tablet Take 1 tablet (75 mg total) by mouth daily. 90 tablet 4  . hydrochlorothiazide (HYDRODIURIL) 25 MG tablet Take 25 mg by mouth daily as needed (for fluid).     Marland Kitchen HYDROcodone-acetaminophen (NORCO) 5-325 MG per tablet Take 1 tablet by mouth every 6 (six) hours as needed for pain. 60 tablet 1  . isosorbide mononitrate (IMDUR) 30 MG 24 hr tablet Take 1 tablet (30 mg total) by mouth daily. 90 tablet 2  . lisinopril (PRINIVIL,ZESTRIL) 10 MG tablet Take 1 tablet (10 mg total) by mouth daily. 90 tablet 2  . metoprolol succinate (TOPROL-XL) 50 MG 24 hr tablet Take 1 tablet (50 mg total) by mouth daily. Take with or immediately following a meal. 90 tablet 2  . nitroGLYCERIN (NITROSTAT) 0.4 MG SL tablet Place 1 tablet (0.4 mg total) under the tongue every 5 (five) minutes as needed for chest pain. 25 tablet 12  . rivaroxaban (XARELTO) 20 MG TABS tablet Take 1 tablet (20 mg total) by mouth daily with supper. 90 tablet 4  . sotalol (BETAPACE) 120 MG tablet Take 1 tablet (120 mg total) by mouth every 12 (twelve) hours. 60 tablet 6  . colchicine 0.6 MG tablet Take 1 tablet (0.6 mg total) by mouth 2 (two) times daily. 14 tablet 0  . oxyCODONE-acetaminophen (PERCOCET/ROXICET) 5-325 MG tablet Take 1 tablet by mouth every 6 (six) hours as needed for severe pain. 5 tablet 0   No facility-administered medications prior to visit.      Allergies:   Patient has no known allergies.   Social History   Social History  .  Marital status: Divorced    Spouse name: N/A  . Number of children: N/A  . Years of education: N/A   Social History Main Topics  . Smoking status: Never Smoker  . Smokeless tobacco: Never Used  . Alcohol use Yes     Comment: almost daily use of brandy or beer  . Drug use: No  . Sexual activity: Not Asked   Other Topics Concern  . None   Social History Narrative  . None     Family History:  The patient's family history includes Atrial fibrillation in his mother; COPD in his father; Cholecystitis in his sister; Diabetes in his father; Mesothelioma in his father.      ROS:   Please see the  history of present illness.    ROS All other systems reviewed and are negative.   PHYSICAL EXAM:   VS:  BP 106/70   Pulse 73   Ht 6\' 1"  (1.854 m)   Wt 278 lb 1.9 oz (126.2 kg)   BMI 36.69 kg/m    GEN: Well nourished, well developed, in no acute distress  HEENT: normal  Neck: no JVD, carotid bruits, or masses Cardiac: RRR; no murmurs, rubs, or gallops,no edema  Respiratory:  clear to auscultation bilaterally, normal work of breathing GI: soft, nontender, nondistended, + BS MS: no deformity or atrophy  Skin: warm and dry, no rash Neuro:  Alert and Oriented x 3, Strength and sensation are intact Psych: euthymic mood, full affect    Wt Readings from Last 3 Encounters:  07/24/16 278 lb 1.9 oz (126.2 kg)  07/14/16 283 lb 6.4 oz (128.5 kg)  07/10/16 276 lb 6.4 oz (125.4 kg)      Studies/Labs Reviewed:   EKG:  EKG is ordered today.  The ekg ordered today demonstrates NSR, HR 88- QTc 469  Recent Labs: 07/06/2016: ALT 31; TSH 10.311 07/09/2016: B Natriuretic Peptide 245.8 07/10/2016: Hemoglobin 11.0; Platelets 198 07/14/2016: BUN 11; Creatinine, Ser 0.86; Magnesium 2.0; Potassium 4.5; Sodium 139   Lipid Panel    Component Value Date/Time   CHOL 191 07/06/2016 0307   TRIG 316 (H) 07/06/2016 0307   HDL 23 (L) 07/06/2016 0307   CHOLHDL 8.3 07/06/2016 0307   VLDL 63 (H) 07/06/2016  0307   LDLCALC 105 (H) 07/06/2016 0307    Additional studies/ records that were reviewed today include:  LHC: 07/06/16 Conclusion   1st Mrg lesion, 20 %stenosed.  Mid LAD lesion, 20 %stenosed.  A STENT SYNERGY DES [id number] drug eluting stent was successfully placed.  Prox RCA to Mid RCA lesion, 100 %stenosed.  Post intervention, there is a 0% residual stenosis.  The left ventricular ejection fraction is 45-50% by visual estimate.  There is mild left ventricular systolic dysfunction.  Acute/subacute very proximal RCA occlusion with extensive thrombus burden and TIMI 0 flow. Mild nonobstructive concomitant CAD with 20% LAD stenosis and 20% circumflex narrowing and a large left circumflex vessel. There was a hint of very minimal if any distal collateralization of the PLA vessel from the RCA via the left injection. Difficult but successful PCI to the totally occluded RCA with extensive thrombus requiring thrombectomy, PTCA, and ultimate DES stenting with a 3.538 mm Synergy DES stent postdilated to 4.14 mm very proximally and 3.9-4.0 mm distally in a very large RCA that supplies the PDA and PLA vessel. RECOMMENDATION: Dual antiplatelet therapy. The patient will continue on Aggrastat for 18 hours post procedure. The patient will be started on high potency statin therapy, and medical therapy post MI including ACE-I/ARB, and beta blockerRx.   _____________   2D ECHO: 07/08/2016 LV EF: 55% - 60% Study 00 Conclusions - Left ventricle: The cavity size was normal. Wall thickness was increased in a pattern of mild LVH. Systolic function was c. The estimated ejection fraction was in the range of 55% to 60%. Basal inferior akinesis. Doppler parameters are consistent with abnormal left ventricular relaxation (grade 1 diastolic dysfunction).[id number] - Aortic valve: There was no stenosis. - Aorta: Mildly dilated aortic root. Aortic root dimension: 38  mm (ED). - Mitral valve: There was trivial regurgitation. - Left atrium: The atrium was mildly dilated. - Right ventricle: Mildly dilated RV with mild to moderately decreased systolic function. The free wall is hypokinetic  compared to the apex. No PE on CTA, would be concerned for RV infarction. - Right atrium: The atrium was mildly dilated. - Pulmonary arteries: No complete TR doppler jet so unable to estimate PA systolic pressure. - Systemic veins: IVC measured 2.4 cm with > 50% respirophasic variation, suggesting RA pressure 8 mmHg. Impressions: - Normal LV size with mild LV hypertrophy. EF 55-60%, basal inferior akinesis. The RV was mildly dilated with mild to moderate systolic dysfunction. Free wall was hypokinetic compared to apex, in absence of PE would consider RV infarction. No significant valvular abnormalities.     ASSESSMENT & PLAN:   CAD: stable. Continue triple therapy with ASA/Brilinta/Xarelto. Will stop ASA 1 month after PCI on 08/05/16. Continue BB and statin.   PAF: maintaining sinus rhythm on Sotolol 120mg  BID. Dr Rayann Heman reviewed ECG. QTc 469. CHADSVASC of at least 3 (CHF, HTN, vasc dz). Continue Xarelto 20mg  daily  HTN: Bp well controlled today.   HLD: LDL 105 during most recent admission. Now on atorvastatin 80mg  daily.  Possible Dressler's syndrome: chest pain now resolved and off colchicine   Diarrhea: this has been going on since before his recent admission. Imodium has been helping. I have asked him to follow with PCP  Obesity: Body mass index is 36.69 kg/m. He has already lost about 8 lbs through diet and exercise.   Medication Adjustments/Labs and Tests Ordered: Current medicines are reviewed at length with the patient today.  Concerns regarding medicines are outlined above.  Medication changes, Labs and Tests ordered today are listed in the Patient Instructions below. Patient Instructions  Medication Instructions:  Your  physician has recommended you make the following change in your medication:  1.  STOP the Aspirin as of 08/05/16  Labwork: None ordered  Testing/Procedures: None ordered  Follow-Up: Your physician recommends that you schedule a follow-up appointment in: 4 MONTHS WITH DR. Martinique    Any Other Special Instructions Will Be Listed Below (If Applicable).     If you need a refill on your cardiac medications before your next appointment, please call your pharmacy.      Signed, Angelena Form, PA-C  07/24/2016 11:13 AM    [facility] [place], [place], [place]  [address] Phone: [phone]; Fax: [phone]

## 2016-07-24 ENCOUNTER — Ambulatory Visit (INDEPENDENT_AMBULATORY_CARE_PROVIDER_SITE_OTHER): Payer: Self-pay | Admitting: Physician Assistant

## 2016-07-24 ENCOUNTER — Encounter: Payer: Self-pay | Admitting: Physician Assistant

## 2016-07-24 VITALS — BP 106/70 | HR 73 | Ht 73.0 in | Wt 278.1 lb

## 2016-07-24 DIAGNOSIS — I480 Paroxysmal atrial fibrillation: Secondary | ICD-10-CM

## 2016-07-24 DIAGNOSIS — I2510 Atherosclerotic heart disease of native coronary artery without angina pectoris: Secondary | ICD-10-CM

## 2016-07-24 DIAGNOSIS — E785 Hyperlipidemia, unspecified: Secondary | ICD-10-CM

## 2016-07-24 DIAGNOSIS — I10 Essential (primary) hypertension: Secondary | ICD-10-CM

## 2016-07-24 NOTE — Patient Instructions (Addendum)
Medication Instructions:  Your physician has recommended you make the following change in your medication:  1.  STOP the Aspirin as of 08/05/16  Labwork: None ordered  Testing/Procedures: None ordered  Follow-Up: Your physician recommends that you schedule a follow-up appointment in: 4 MONTHS WITH DR. Martinique    Any Other Special Instructions Will Be Listed Below (If Applicable).     If you need a refill on your cardiac medications before your next appointment, please call your pharmacy.

## 2016-08-10 ENCOUNTER — Telehealth: Payer: Self-pay | Admitting: Cardiology

## 2016-08-10 ENCOUNTER — Telehealth (HOSPITAL_COMMUNITY): Payer: Self-pay | Admitting: *Deleted

## 2016-08-10 NOTE — Telephone Encounter (Signed)
SPOKE TO PATIENT INFORMED PATIENT WILL DEFER TO KATIE THOMPSON.SHE WAS LAST PROVIDER PATIENT SAW.  PATIENT WAS SEEN BY DR RAMOS - THIS THE FIRST OPTION----( EXERCISE,TENS UNITS)

## 2016-08-10 NOTE — Telephone Encounter (Signed)
Pt called here stating needed cardiac clearance to return to therapy for workers comp case. Informed pt he would need to be in contact with his cardiologist, Dr Martinique for this. Pt verbalized understanding.

## 2016-08-10 NOTE — Telephone Encounter (Signed)
°  New Prob  Pts states he needs approval to undergo physical therapy for his neck. States he was asked to reach out to his cardiologist regarding approval. Please call.

## 2016-08-11 NOTE — Telephone Encounter (Signed)
Covering for Chad Ellis as she is out on vacation. I do not see any contraindications to him undergoing PT for his neck. Cleared to proceed with therapy. Thank you!

## 2016-08-11 NOTE — Telephone Encounter (Signed)
Informed patient . Cleared for physical therapy.  patient request information be faxed/routed to Dr Suella Broad OFFICE [facility] attention Mechele Claude  Routed information  Patient aware.

## 2016-10-13 ENCOUNTER — Ambulatory Visit (HOSPITAL_COMMUNITY)
Admission: RE | Admit: 2016-10-13 | Discharge: 2016-10-13 | Disposition: A | Discharge status: Home / Self Care | Payer: Self-pay | Source: Ambulatory Visit | Attending: Nurse Practitioner | Admitting: Nurse Practitioner

## 2016-10-13 ENCOUNTER — Encounter (HOSPITAL_COMMUNITY): Payer: Self-pay | Admitting: Nurse Practitioner

## 2016-10-13 VITALS — BP 116/84 | HR 77 | Ht 73.0 in | Wt 273.0 lb

## 2016-10-13 DIAGNOSIS — Z6836 Body mass index (BMI) 36.0-36.9, adult: Secondary | ICD-10-CM | POA: Insufficient documentation

## 2016-10-13 DIAGNOSIS — I10 Essential (primary) hypertension: Secondary | ICD-10-CM | POA: Insufficient documentation

## 2016-10-13 DIAGNOSIS — Z7902 Long term (current) use of antithrombotics/antiplatelets: Secondary | ICD-10-CM | POA: Insufficient documentation

## 2016-10-13 DIAGNOSIS — I2510 Atherosclerotic heart disease of native coronary artery without angina pectoris: Secondary | ICD-10-CM | POA: Insufficient documentation

## 2016-10-13 DIAGNOSIS — I480 Paroxysmal atrial fibrillation: Secondary | ICD-10-CM | POA: Insufficient documentation

## 2016-10-13 DIAGNOSIS — Z833 Family history of diabetes mellitus: Secondary | ICD-10-CM | POA: Insufficient documentation

## 2016-10-13 DIAGNOSIS — E669 Obesity, unspecified: Secondary | ICD-10-CM | POA: Insufficient documentation

## 2016-10-13 DIAGNOSIS — I252 Old myocardial infarction: Secondary | ICD-10-CM | POA: Insufficient documentation

## 2016-10-13 DIAGNOSIS — Z79899 Other long term (current) drug therapy: Secondary | ICD-10-CM | POA: Insufficient documentation

## 2016-10-13 DIAGNOSIS — Z7901 Long term (current) use of anticoagulants: Secondary | ICD-10-CM | POA: Insufficient documentation

## 2016-10-13 DIAGNOSIS — Z825 Family history of asthma and other chronic lower respiratory diseases: Secondary | ICD-10-CM | POA: Insufficient documentation

## 2016-10-13 DIAGNOSIS — F419 Anxiety disorder, unspecified: Secondary | ICD-10-CM | POA: Insufficient documentation

## 2016-10-13 DIAGNOSIS — Z8249 Family history of ischemic heart disease and other diseases of the circulatory system: Secondary | ICD-10-CM | POA: Insufficient documentation

## 2016-10-13 DIAGNOSIS — Z955 Presence of coronary angioplasty implant and graft: Secondary | ICD-10-CM | POA: Insufficient documentation

## 2016-10-13 DIAGNOSIS — E785 Hyperlipidemia, unspecified: Secondary | ICD-10-CM | POA: Insufficient documentation

## 2016-10-13 LAB — CBC
HCT: 40.9 % (ref 39.0–52.0)
HEMOGLOBIN: 14.4 g/dL (ref 13.0–17.0)
MCH: 31.2 pg (ref 26.0–34.0)
MCHC: 35.2 g/dL (ref 30.0–36.0)
MCV: 88.5 fL (ref 78.0–100.0)
Platelets: 181 10*3/uL (ref 150–400)
RBC: 4.62 MIL/uL (ref 4.22–5.81)
RDW: 13.3 % (ref 11.5–15.5)
WBC: 5.8 10*3/uL (ref 4.0–10.5)

## 2016-10-13 LAB — BASIC METABOLIC PANEL
ANION GAP: 6 (ref 5–15)
BUN: 9 mg/dL (ref 6–20)
CHLORIDE: 106 mmol/L (ref 101–111)
CO2: 26 mmol/L (ref 22–32)
Calcium: 8.9 mg/dL (ref 8.9–10.3)
Creatinine, Ser: 0.86 mg/dL (ref 0.61–1.24)
Glucose, Bld: 120 mg/dL — ABNORMAL HIGH (ref 65–99)
POTASSIUM: 4.1 mmol/L (ref 3.5–5.1)
SODIUM: 138 mmol/L (ref 135–145)

## 2016-10-13 LAB — MAGNESIUM: MAGNESIUM: 2 mg/dL (ref 1.7–2.4)

## 2016-10-13 MED ORDER — RIVAROXABAN 20 MG PO TABS: 20.0000 mg | ORAL_TABLET | Freq: Every day | ORAL | 4 refills | Status: DC

## 2016-10-13 NOTE — Progress Notes (Signed)
Primary Care Physician: Hiram Comber, PA-C Referring Physician: Dr. Rayann Heman Cardiologist:Dr. Carrie Mew Chad Ellis is a 56 y.o. male with a h/o HTN and no previous cardiac history who presented to [facility] with chest pain and dyspnea. He was found to have afib with RVR and elevated troponin and was transferred to [facility] for further work up and treatment.    Admitted with NSTEMI (peak troponin ~6) and found to have a severe proximal RCA stenosis treated with thrombectomy and DES. He was initially placed on Brilinta, but this was changed to plavix given ongoing afib and need for Xarelto. He is now on ASA, Plavix, statin, beta blocker, Imdur. LV function is normal post cath. Plan will be to continue ASA/Plavix/Xarelto for 1 month and then drop ASA.    He was in atrial fib at time of presentation but converted to sinus. He went back in to atrial fib with RVR and was seen by Allred. He is now on Xarelto and Sotalol per Dr. Rayann Heman. CHADSVASC score at least 3 (HTN, CHF, vasc dz). 2D ECHO showed normal LV function with mild LVH, basal inferior HK, mildly dilated RV with mild-mod RV dysfunction, free wall hypokinetic compared to apex.   He is in the clinic today, 8/3, to f/u sotalol. He reports that he is taking his father's 15 mg xarelto as he could not afford. He is out of work from a shoulder/ neck injury and was trying have surgery scheduled when he developed his heart problems. 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 Xarelto would cost $ 400 a month. No issues with afib. No chest pain.  Today, he denies symptoms of palpitations, chest pain, shortness of breath, orthopnea, PND, lower extremity edema, dizziness, presyncope, syncope, or neurologic sequela. The patient is tolerating medications without difficulties and is otherwise without complaint today.   Past Medical History:  Diagnosis Date  . Anxiety   . Coronary artery disease    a. 06/2016: NSTEMI s/p DES to RCA  .  Diverticulosis   . 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 Ectopic atrial rhythm   . HLD (hyperlipidemia)   . HTN (hypertension)   . PAF (paroxysmal atrial fibrillation) ([place])    a. 06/2016: Dx'd and started on Sotolol and Xarelto   Past Surgical History:  Procedure Laterality Date  . CHONDROPLASTY Right 05/10/2012   Procedure: CHONDROPLASTY;  Surgeon: Carole Civil, MD;  Location: [facility];  Service: Orthopedics;  Laterality: Right;  of patella  . COLONOSCOPY    . CORONARY STENT INTERVENTION N/A 07/06/2016   Procedure: Coronary Stent Intervention;  Surgeon: Troy Sine, MD;  Location: [place] [facility];  Service: Cardiovascular;  Laterality: N/A;  . ESOPHAGOGASTRODUODENOSCOPY    . EXAM UNDER ANESTHESIA WITH MANIPULATION OF KNEE Right 05/10/2012   Procedure: EXAM UNDER ANESTHESIA WITH MANIPULATION OF KNEE;  Surgeon: Carole Civil, MD;  Location: [facility];  Service: Orthopedics;  Laterality: Right;  start 0750 end 751  . KNEE ARTHROSCOPY WITH EXCISION PLICA Right 09/25/9676   Procedure: KNEE ARTHROSCOPY WITH EXCISION PLICA;  Surgeon: Carole Civil, MD;  Location: [facility];  Service: Orthopedics;  Laterality: Right;  . KNEE ARTHROSCOPY WITH MEDIAL MENISECTOMY Right 05/10/2012   Procedure: KNEE ARTHROSCOPY WITH MEDIAL MENISECTOMY;  Surgeon: Carole Civil, MD;  Location: [facility];  Service: Orthopedics;  Laterality: Right;  . LEFT HEART CATH AND CORONARY ANGIOGRAPHY N/A 07/06/2016   Procedure: Left Heart Cath and Coronary Angiography;  Surgeon: Marcello Moores  Floyce Stakes, MD;  Location: [place] [facility];  Service: Cardiovascular;  Laterality: N/A;    Current Outpatient Prescriptions  Medication Sig Dispense Refill  . acetaminophen (TYLENOL) 500 MG tablet Take 500 mg by mouth every 6 (six) hours as needed for moderate pain or headache.    . albuterol (PROVENTIL HFA;VENTOLIN HFA) 108 (90 Base) MCG/ACT inhaler Inhale 1-2 puffs into the lungs every 6 (six) hours as needed for wheezing or shortness of  breath.    . ALPRAZolam (XANAX) 1 MG tablet Take 1 mg by mouth 3 (three) times daily as needed for anxiety or sleep.     Marland Kitchen atorvastatin (LIPITOR) 80 MG tablet Take 1 tablet (80 mg total) by mouth daily at 6 PM. 90 tablet 1  . clopidogrel (PLAVIX) 75 MG tablet Take 1 tablet (75 mg total) by mouth daily. 90 tablet 4  . hydrochlorothiazide (HYDRODIURIL) 25 MG tablet Take 25 mg by mouth daily as needed (for fluid).     Marland Kitchen HYDROcodone-acetaminophen (NORCO) 5-325 MG per tablet Take 1 tablet by mouth every 6 (six) hours as needed for pain. 60 tablet 1  . isosorbide mononitrate (IMDUR) 30 MG 24 hr tablet Take 1 tablet (30 mg total) by mouth daily. 90 tablet 2  . lisinopril (PRINIVIL,ZESTRIL) 10 MG tablet Take 1 tablet (10 mg total) by mouth daily. 90 tablet 2  . metoprolol succinate (TOPROL-XL) 50 MG 24 hr tablet Take 1 tablet (50 mg total) by mouth daily. Take with or immediately following a meal. 90 tablet 2  . nitroGLYCERIN (NITROSTAT) 0.4 MG SL tablet Place 1 tablet (0.4 mg total) under the tongue every 5 (five) minutes as needed for chest pain. 25 tablet 12  . sotalol (BETAPACE) 120 MG tablet Take 1 tablet (120 mg total) by mouth every 12 (twelve) hours. 60 tablet 6  . rivaroxaban (XARELTO) 20 MG TABS tablet Take 1 tablet (20 mg total) by mouth daily with supper. 90 tablet 4   No current facility-administered medications for this encounter.     No Known Allergies  Social History   Social History  . Marital status: Divorced    Spouse name: N/A  . Number of children: N/A  . Years of education: N/A   Occupational History  . Not on file.   Social History Main Topics  . Smoking status: Never Smoker  . Smokeless tobacco: Never Used  . Alcohol use Yes     Comment: almost daily use of brandy or beer  . Drug use: No  . Sexual activity: Not on file   Other Topics Concern  . Not on file   Social History Narrative  . No narrative on file    Family History  Problem Relation Age of Onset    . Atrial fibrillation Mother   . COPD Father   . Mesothelioma Father   . Diabetes Father   . Cholecystitis Sister     ROS- All systems are reviewed and negative except as per the HPI above  Physical Exam: Vitals:   10/13/16 1007  BP: 116/84  Pulse: 77  Weight: 273 lb (123.8 kg)  Height: 6\' 1"  (1.854 m)   Wt Readings from Last 3 Encounters:  10/13/16 273 lb (123.8 kg)  07/24/16 278 lb 1.9 oz (126.2 kg)  07/14/16 283 lb 6.4 oz (128.5 kg)    Labs: Lab Results  Component Value Date   NA 138 10/13/2016   K 4.1 10/13/2016   CL 106 10/13/2016   CO2 26 10/13/2016  GLUCOSE 120 (H) 10/13/2016   BUN 9 10/13/2016   CREATININE 0.86 10/13/2016   CALCIUM 8.9 10/13/2016   MG 2.0 10/13/2016   Lab Results  Component Value Date   INR 1.08 07/06/2016   Lab Results  Component Value Date   CHOL 191 07/06/2016   HDL 23 (L) 07/06/2016   LDLCALC 105 (H) 07/06/2016   TRIG 316 (H) 07/06/2016     GEN- The patient is well appearing, alert and oriented x 3 today.   Head- normocephalic, atraumatic Eyes-  Sclera clear, conjunctiva pink Ears- hearing intact Oropharynx- clear Neck- supple, no JVP Lymph- no cervical lymphadenopathy Lungs- Clear to ausculation bilaterally, normal work of breathing Heart- Regular rate and rhythm, no murmurs, rubs or gallops, PMI not laterally displaced GI- soft, NT, ND, + BS Extremities- no clubbing, cyanosis, or edema MS- no significant deformity or atrophy Skin- no rash or lesion Psych- euthymic mood, full affect Neuro- strength and sensation are intact  EKG-SR with sinus arrhythmia at 77 bpm, pr int 138 ms, qrs int 84 ms, qtc 461 ms(stable) Epic records reviewed    Assessment and Plan: 1. Paroxysmal afib Maintaining SR on sotalol 120 mg bid Continue metoprolol Discussed that he is under treating himself by not taking the full 20 mg tablet of xarelto Stop 15 mg xarelto that is father's prescription Will arrange for some samples today and  fill out assistance form for pt bmet/mag today  2. CAD Stable Now off asa Continue plavix Isosorbide Statin  3. Obesity Encouraged weight loss Encouraged regular exercise  4. HTN Stable Lisinopril/HCTZ  F/u Dr. Martinique in September as scheduled Dr. Rayann Heman in 6 months  Geroge Baseman. Jameison Haji, [facility] [address] [place], [place] [address] [phone]

## 2016-12-01 NOTE — Progress Notes (Signed)
Cardiology Office Note    Date:  12/07/2016   ID:  Chad Ellis, DOB [birth year], MRN [id number]  PCP:  Chad Comber, PA-C  Cardiologist: Dr. Martinique  EP:  Dr. Rayann Heman   CC: follow up CAD  History of Present Illness:  Chad Ellis is a 56 y.o. male with a history of HTN, PAF on Sotolol and Xarelto and CAD s/p DES to RCA (06/2016) who presents to clinic for follow up.  He was admitted from 4/26-4/30/18. He initially presented to [facility] with CP, SOB and orthopnea. He was found to have afib with RVR and elevated troponin so he was transferred to [facility]. Troponin peaked at 6 and found to have a severe proximal RCA stenosis treated with thrombectomy and DES. 2D ECHO showed normal LV function with mild LVH, basal inferior HK, mildly dilated RV with mild-mod RV dysfunction, free wall hypokinetic compared to apex. He was initially placed on Brilinta, but this was changed to plavix given ongoing afib and need for  therapy with Xarelto.  Dr. Rayann Heman saw patient and started him on Sotalol 120mg  BID. He also continued to have SOB and pleuritic chest pain. CT angio was negative for PE but did show small pleural effusions. He was diuresed with IV lasix with improvement. Chest pain improved with colchicine.   He saw Roderic Palau NP in the afib clinic in May and again on August 3. He was maintaining NSR on sotolol. He was unable to afford Xarelto so was taking his father's medication at 15 mg daily. Informed he needed to take 20 mg and was given samples and patient assistance forms filled out.   Today he presents  for follow up. He is feeling better. He has rare chest pain and only in the very hot humid weather.  He feels like breathing is good. No LE edema, orthopnea or PND. No bleeding. He has lost 6 lbs.  No dizziness or syncope. He was having frequent loose stools but is now taking a herbal therapy - Kratom and states it is great.  Past Medical History:  Diagnosis Date  . Anxiety    . Coronary artery disease    a. 06/2016: NSTEMI s/p DES to RCA  . Diverticulosis   . 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 Ectopic atrial rhythm   . HLD (hyperlipidemia)   . HTN (hypertension)   . PAF (paroxysmal atrial fibrillation) ([place])    a. 06/2016: Dx'd and started on Sotolol and Xarelto    Past Surgical History:  Procedure Laterality Date  . CHONDROPLASTY Right 05/10/2012   Procedure: CHONDROPLASTY;  Surgeon: Carole Civil, MD;  Location: [facility];  Service: Orthopedics;  Laterality: Right;  of patella  . COLONOSCOPY    . CORONARY STENT INTERVENTION N/A 07/06/2016   Procedure: Coronary Stent Intervention;  Surgeon: Troy Sine, MD;  Location: [place] [facility];  Service: Cardiovascular;  Laterality: N/A;  . ESOPHAGOGASTRODUODENOSCOPY    . EXAM UNDER ANESTHESIA WITH MANIPULATION OF KNEE Right 05/10/2012   Procedure: EXAM UNDER ANESTHESIA WITH MANIPULATION OF KNEE;  Surgeon: Carole Civil, MD;  Location: [facility];  Service: Orthopedics;  Laterality: Right;  start 0750 end 751  . KNEE ARTHROSCOPY WITH EXCISION PLICA Right 08/31/3084   Procedure: KNEE ARTHROSCOPY WITH EXCISION PLICA;  Surgeon: Carole Civil, MD;  Location: [facility];  Service: Orthopedics;  Laterality: Right;  . KNEE ARTHROSCOPY WITH MEDIAL MENISECTOMY Right 05/10/2012   Procedure: KNEE ARTHROSCOPY WITH MEDIAL MENISECTOMY;  Surgeon:  Carole Civil, MD;  Location: [facility];  Service: Orthopedics;  Laterality: Right;  . LEFT HEART CATH AND CORONARY ANGIOGRAPHY N/A 07/06/2016   Procedure: Left Heart Cath and Coronary Angiography;  Surgeon: Troy Sine, MD;  Location: [place] [facility];  Service: Cardiovascular;  Laterality: N/A;    Current Medications: Outpatient Medications Prior to Visit  Medication Sig Dispense Refill  . acetaminophen (TYLENOL) 500 MG tablet Take 500 mg by mouth every 6 (six) hours as needed for moderate pain or headache.    . albuterol (PROVENTIL HFA;VENTOLIN HFA) 108 (90 Base) MCG/ACT  inhaler Inhale 1-2 puffs into the lungs every 6 (six) hours as needed for wheezing or shortness of breath.    . ALPRAZolam (XANAX) 1 MG tablet Take 1 mg by mouth 3 (three) times daily as needed for anxiety or sleep.     Marland Kitchen atorvastatin (LIPITOR) 80 MG tablet Take 1 tablet (80 mg total) by mouth daily at 6 PM. 90 tablet 1  . clopidogrel (PLAVIX) 75 MG tablet Take 1 tablet (75 mg total) by mouth daily. 90 tablet 4  . hydrochlorothiazide (HYDRODIURIL) 25 MG tablet Take 25 mg by mouth daily as needed (for fluid).     Marland Kitchen HYDROcodone-acetaminophen (NORCO) 5-325 MG per tablet Take 1 tablet by mouth every 6 (six) hours as needed for pain. 60 tablet 1  . lisinopril (PRINIVIL,ZESTRIL) 10 MG tablet Take 1 tablet (10 mg total) by mouth daily. 90 tablet 2  . metoprolol succinate (TOPROL-XL) 50 MG 24 hr tablet Take 1 tablet (50 mg total) by mouth daily. Take with or immediately following a meal. 90 tablet 2  . nitroGLYCERIN (NITROSTAT) 0.4 MG SL tablet Place 1 tablet (0.4 mg total) under the tongue every 5 (five) minutes as needed for chest pain. 25 tablet 12  . rivaroxaban (XARELTO) 20 MG TABS tablet Take 1 tablet (20 mg total) by mouth daily with supper. 90 tablet 4  . sotalol (BETAPACE) 120 MG tablet Take 1 tablet (120 mg total) by mouth every 12 (twelve) hours. 60 tablet 6  . isosorbide mononitrate (IMDUR) 30 MG 24 hr tablet Take 1 tablet (30 mg total) by mouth daily. 90 tablet 2   No facility-administered medications prior to visit.      Allergies:   Patient has no known allergies.   Social History   Social History  . Marital status: Divorced    Spouse name: N/A  . Number of children: N/A  . Years of education: N/A   Social History Main Topics  . Smoking status: Never Smoker  . Smokeless tobacco: Never Used  . Alcohol use Yes     Comment: almost daily use of brandy or beer  . Drug use: No  . Sexual activity: Not Asked   Other Topics Concern  . None   Social History Narrative  . None      Family History:  The patient's family history includes Atrial fibrillation in his mother; COPD in his father; Cholecystitis in his sister; Diabetes in his father; Mesothelioma in his father.      ROS:   Please see the history of present illness.    ROS All other systems reviewed and are negative.   PHYSICAL EXAM:   VS:  BP 126/86   Pulse 80   Ht 6\' 1"  (1.854 m)   Wt 272 lb (123.4 kg)   BMI 35.89 kg/m    GENERAL:  Well appearing WM in NAD HEENT:  PERRL, EOMI, sclera are clear.  Oropharynx is clear. NECK:  No jugular venous distention, carotid upstroke brisk and symmetric, no bruits, no thyromegaly or adenopathy LUNGS:  Clear to auscultation bilaterally CHEST:  Unremarkable HEART:  RRR,  PMI not displaced or sustained,S1 and S2 within normal limits, no S3, no S4: no clicks, no rubs, no murmurs ABD:  Soft, nontender. BS +, no masses or bruits. No hepatomegaly, no splenomegaly EXT:  2 + pulses throughout, no edema, no cyanosis no clubbing SKIN:  Warm and dry.  No rashes NEURO:  Alert and oriented x 3. Cranial nerves II through XII intact. PSYCH:  Cognitively intact      Wt Readings from Last 3 Encounters:  12/07/16 272 lb (123.4 kg)  10/13/16 273 lb (123.8 kg)  07/24/16 278 lb 1.9 oz (126.2 kg)      Studies/Labs Reviewed:   EKG:  None today  Recent Labs: 07/06/2016: ALT 31; TSH 10.311 07/09/2016: B Natriuretic Peptide 245.8 10/13/2016: BUN 9; Creatinine, Ser 0.86; Hemoglobin 14.4; Magnesium 2.0; Platelets 181; Potassium 4.1; Sodium 138   Lipid Panel    Component Value Date/Time   CHOL 191 07/06/2016 0307   TRIG 316 (H) 07/06/2016 0307   HDL 23 (L) 07/06/2016 0307   CHOLHDL 8.3 07/06/2016 0307   VLDL 63 (H) 07/06/2016 0307   LDLCALC 105 (H) 07/06/2016 0307    Additional studies/ records that were reviewed today include:  LHC: 07/06/16 Conclusion   1st Mrg lesion, 20 %stenosed.  Mid LAD lesion, 20 %stenosed.  A STENT SYNERGY DES [id number] drug eluting stent  was successfully placed.  Prox RCA to Mid RCA lesion, 100 %stenosed.  Post intervention, there is a 0% residual stenosis.  The left ventricular ejection fraction is 45-50% by visual estimate.  There is mild left ventricular systolic dysfunction.  Acute/subacute very proximal RCA occlusion with extensive thrombus burden and TIMI 0 flow. Mild nonobstructive concomitant CAD with 20% LAD stenosis and 20% circumflex narrowing and a large left circumflex vessel. There was a hint of very minimal if any distal collateralization of the PLA vessel from the RCA via the left injection. Difficult but successful PCI to the totally occluded RCA with extensive thrombus requiring thrombectomy, PTCA, and ultimate DES stenting with a 3.538 mm Synergy DES stent postdilated to 4.14 mm very proximally and 3.9-4.0 mm distally in a very large RCA that supplies the PDA and PLA vessel. RECOMMENDATION: Dual antiplatelet therapy. The patient will continue on Aggrastat for 18 hours post procedure. The patient will be started on high potency statin therapy, and medical therapy post MI including ACE-I/ARB, and beta blockerRx.   _____________   2D ECHO: 07/08/2016 LV EF: 55% - 60% Study 00 Conclusions - Left ventricle: The cavity size was normal. Wall thickness was increased in a pattern of mild LVH. Systolic function was c. The estimated ejection fraction was in the range of 55% to 60%. Basal inferior akinesis. Doppler parameters are consistent with abnormal left ventricular relaxation (grade 1 diastolic dysfunction).[phone] - Aortic valve: There was no stenosis. - Aorta: Mildly dilated aortic root. Aortic root dimension: 38 mm (ED). - Mitral valve: There was trivial regurgitation. - Left atrium: The atrium was mildly dilated. - Right ventricle: Mildly dilated RV with mild to moderately decreased systolic function. The free wall is hypokinetic compared to the apex. No PE on  CTA, would be concerned for RV infarction. - Right atrium: The atrium was mildly dilated. - Pulmonary arteries: No complete TR doppler jet so unable to estimate PA systolic pressure. - Systemic  veins: IVC measured 2.4 cm with > 50% respirophasic variation, suggesting RA pressure 8 mmHg. Impressions: - Normal LV size with mild LV hypertrophy. EF 55-60%, basal inferior akinesis. The RV was mildly dilated with mild to moderate systolic dysfunction. Free wall was hypokinetic compared to apex, in absence of PE would consider RV infarction. No significant valvular abnormalities.     ASSESSMENT & PLAN:   CAD: s/p inferior infarct and stenting of RCA with DES in April.  No significant angina.  Continue dual therapy with Brilinta/Xarelto for at least one year.  Continue BB and statin. OK to stop isosorbide now.   PAF: maintaining sinus rhythm on Sotolol 120mg  BID.  CHADSVASC of at least 3 (CHF, HTN, vasc dz). Continue Xarelto 20mg  daily. Samples given today.  HTN: Bp well controlled today.   HLD: LDL 105 during most recent admission. Now on atorvastatin 80mg  daily. Will check lipids and LFTs today.  Diarrhea: now taking herbal remedy. Cautioned him on using this long term. Unregulated and interactions with other medications unknown.   Obesity: Body mass index is 35.89 kg/m. He is losing weight.  Medication Adjustments/Labs and Tests Ordered: Current medicines are reviewed at length with the patient today.  Concerns regarding medicines are outlined above.  Medication changes, Labs and Tests ordered today are listed in the Patient Instructions below. Patient Instructions  Stop taking isosorbide  Continue your other therapy  We will check your cholesterol and liver tests today  I will see you in 6 months.      Signed, Simrit Gohlke Martinique, MD  12/07/2016 10:50 AM    [address], [place], [place]  [address] Phone: [phone]; Fax:  [phone]

## 2016-12-07 ENCOUNTER — Ambulatory Visit (INDEPENDENT_AMBULATORY_CARE_PROVIDER_SITE_OTHER): Payer: Self-pay | Admitting: Cardiology

## 2016-12-07 ENCOUNTER — Encounter: Payer: Self-pay | Admitting: Cardiology

## 2016-12-07 VITALS — BP 126/86 | HR 80 | Ht 73.0 in | Wt 272.0 lb

## 2016-12-07 DIAGNOSIS — I480 Paroxysmal atrial fibrillation: Secondary | ICD-10-CM

## 2016-12-07 DIAGNOSIS — I10 Essential (primary) hypertension: Secondary | ICD-10-CM

## 2016-12-07 DIAGNOSIS — E785 Hyperlipidemia, unspecified: Secondary | ICD-10-CM

## 2016-12-07 DIAGNOSIS — I2510 Atherosclerotic heart disease of native coronary artery without angina pectoris: Secondary | ICD-10-CM

## 2016-12-07 LAB — LIPID PANEL W/O CHOL/HDL RATIO
CHOLESTEROL TOTAL: 130 mg/dL (ref 100–199)
HDL: 34 mg/dL — AB (ref >39)
LDL CALC: 57 mg/dL (ref 0–99)
TRIGLYCERIDES: 194 mg/dL — AB (ref 0–149)
VLDL Cholesterol Cal: 39 mg/dL (ref 5–40)

## 2016-12-07 LAB — HEPATIC FUNCTION PANEL
ALK PHOS: 80 IU/L (ref 39–117)
ALT: 32 IU/L (ref 0–44)
AST: 23 IU/L (ref 0–40)
Albumin: 4.3 g/dL (ref 3.5–5.5)
BILIRUBIN, DIRECT: 0.14 mg/dL (ref 0.00–0.40)
Bilirubin Total: 0.5 mg/dL (ref 0.0–1.2)
Total Protein: 6.9 g/dL (ref 6.0–8.5)

## 2016-12-07 NOTE — Patient Instructions (Signed)
Stop taking isosorbide  Continue your other therapy  We will check your cholesterol and liver tests today  I will see you in 6 months.

## 2016-12-31 ENCOUNTER — Other Ambulatory Visit: Payer: Self-pay | Admitting: Physician Assistant

## 2017-01-01 NOTE — Telephone Encounter (Signed)
This is Dr. Jordan's pt. °

## 2017-01-11 ENCOUNTER — Encounter: Payer: Self-pay | Admitting: Internal Medicine

## 2017-01-29 ENCOUNTER — Other Ambulatory Visit: Payer: Self-pay

## 2017-01-29 MED ORDER — CLOPIDOGREL BISULFATE 75 MG PO TABS: 75.0000 mg | ORAL_TABLET | Freq: Every day | ORAL | 2 refills | Status: DC

## 2017-02-08 ENCOUNTER — Telehealth: Payer: Self-pay | Admitting: Cardiology

## 2017-02-08 NOTE — Telephone Encounter (Signed)
New Message     Pt c/o of Chest Pain: STAT if CP now or developed within 24 hours  1. Are you having CP right now? No , but he has been getting chest pressure often and he is taking nitro to make it subside  2. Are you experiencing any other symptoms (ex. SOB, nausea, vomiting, sweating)? Nauseated , also been experiencing shortness of breath   3. How long have you been experiencing CP?  A couple weeks 5 times in 6 weeks   4. Is your CP continuous or coming and going? Comes and goes   5. Have you taken Nitroglycerin? Yes    If he takes a elka setlzer it seems to help relieve it also, the nitro helps also but it gives him a bad headache ?

## 2017-02-08 NOTE — Telephone Encounter (Signed)
Returned call to pt he states that he has been having chest pain x5 within the last 5-6 weeks. He states that he is having chest pain and pressure and sweating denies any nausea, vomitting, etc... He states that "it feels like you need to burp" denies that this is indigestion. He states that  It is relieved within taking Nitro "in about 20 minutes" he states that he does not take another nitro because he gets a headache. This has happened and took alka-seltzer x3 and pain was relieved. Pain is located mid chest, Pt states that this pain is not reproducible. Pt states he does not want to make appt or go to the ER he just thought that he should call to let us know. Informed pt that if this is relieved by alka-seltzer this is not chest pain. Please advise

## 2017-02-08 NOTE — Telephone Encounter (Signed)
Dr Doug Sou pt Chad Ellis

## 2017-02-09 NOTE — Telephone Encounter (Signed)
Sounds different than prior angina. Would be happy to make him an appointment with an APP if he wants [place] to evaluate further.  Zionna Homewood Martinique MD, [facility]

## 2017-02-09 NOTE — Telephone Encounter (Signed)
Ok  Peter Jordan MD, FACC   

## 2017-02-09 NOTE — Telephone Encounter (Signed)
Returned call to pt he states that he does not want to schedule appt at this time he does not have insurance right now and will have insurance the first of the year when he has insurance he will call back or go to the ER if needed. He states nothing needed at this time. Pt verbalizes that he will go to the ER if Chest pain recurs or worsens.

## 2017-02-26 ENCOUNTER — Ambulatory Visit: Payer: Self-pay | Admitting: Gastroenterology

## 2017-02-27 ENCOUNTER — Telehealth: Payer: Self-pay | Admitting: Cardiology

## 2017-02-27 NOTE — Telephone Encounter (Signed)
New message    Patient was seen in office 12/07/16     [facility] Pre-operative Risk Assessment    Request for surgical clearance:  1. What type of surgery is being performed? Epidural steroid injection in spine  2. When is this surgery scheduled? No schedule yet   3. Are there any medications that need to be held prior to surgery and how long?xarelto 3 days before   4. Practice name and name of physician performing surgery? Dr Nelva Bush  5. What is your office phone and fax number?  [facility] fax [phone]  6. Anesthesia type (None, local, MAC, general) ?  epidural   Howie Ill 02/27/2017, 3:49 PM  _________________________________________________________________   (provider comments below)

## 2017-02-27 NOTE — Telephone Encounter (Signed)
   Primary Cardiologist: Peter Martinique, MD  Chart reviewed as part of pre-operative protocol coverage. Given past medical history and time since last visit, based on ACC/AHA guidelines, Chad Ellis would be at acceptable risk for the planned procedure without further cardiovascular testing.  Though he may come off of Xarelto three days prior to the procedure, he is also on Brilinta and may not come off of Brilinta given stenting in 06/2016.  He should remain on antiarrhythmic/ blocker, and statin therapy throughout the peri-procedural period.  Please call with questions.  Murray Hodgkins, NP 02/27/2017, 5:37 PM

## 2017-02-28 ENCOUNTER — Telehealth: Payer: Self-pay

## 2017-02-28 NOTE — Telephone Encounter (Signed)
SENT NOTES TO SCHEDULING 

## 2017-03-02 ENCOUNTER — Telehealth: Payer: Self-pay | Admitting: Cardiology

## 2017-03-02 NOTE — Telephone Encounter (Signed)
Dr Nelva Bush office has not received clearance letter. Please send fax [phone]  I have sent a manual fax over clearance to Dr.Ramos office .

## 2017-03-02 NOTE — Telephone Encounter (Signed)
New message    Dr Nelva Bush office has not received clearance letter. Please send fax [phone]

## 2017-04-04 ENCOUNTER — Other Ambulatory Visit: Payer: Self-pay

## 2017-04-04 MED ORDER — METOPROLOL SUCCINATE ER 50 MG PO TB24: 50.0000 mg | ORAL_TABLET | Freq: Every day | ORAL | 2 refills | Status: DC

## 2017-04-09 ENCOUNTER — Telehealth: Payer: Self-pay | Admitting: Cardiology

## 2017-04-09 NOTE — Telephone Encounter (Signed)
LMTCB

## 2017-04-09 NOTE — Telephone Encounter (Signed)
New message   Pt c/o of Chest Pain: STAT if CP now or developed within 24 hours  1. Are you having CP right now? No  2. Are you experiencing any other symptoms (ex. SOB, nausea, vomiting, sweating)? Nausea  3. How long have you been experiencing CP? 2 months 4. Is your CP continuous or coming and going? Coming and going  5. Have you taken Nitroglycerin? Yes ?

## 2017-04-10 MED ORDER — NITROGLYCERIN 0.4 MG SL SUBL: 0.4000 mg | SUBLINGUAL_TABLET | SUBLINGUAL | 12 refills | Status: DC | PRN

## 2017-04-10 NOTE — Telephone Encounter (Signed)
Spoke with pt, since the weather has gotten really cold he has noticed increase in SOB. He also notes that he is having increased fatigue with walking 50-60 feet. He has no SOB in the home. Also he will occ feel his heart beating hard and he will get chest pressure. At times the SOB and the chest pressure will occ at the same time. He has taken 1 NTG and it takes a while but will ease his pressure. He will also use his inhaler for the SOB but it does not always help. Patient voiced understanding of when he should go to the ER related to chest pain and SOB. He has a follow up appointment and will call back prior to appt if symptoms worsen or change.

## 2017-04-13 ENCOUNTER — Other Ambulatory Visit: Payer: Self-pay | Admitting: Physician Assistant

## 2017-04-16 NOTE — Telephone Encounter (Signed)
Rx(s) sent to pharmacy electronically.  

## 2017-04-25 DIAGNOSIS — Z6836 Body mass index (BMI) 36.0-36.9, adult: Secondary | ICD-10-CM | POA: Diagnosis not present

## 2017-04-25 DIAGNOSIS — E6609 Other obesity due to excess calories: Secondary | ICD-10-CM | POA: Diagnosis not present

## 2017-04-25 DIAGNOSIS — M1000 Idiopathic gout, unspecified site: Secondary | ICD-10-CM | POA: Diagnosis not present

## 2017-04-26 ENCOUNTER — Encounter: Payer: Self-pay | Admitting: Physician Assistant

## 2017-04-26 ENCOUNTER — Ambulatory Visit: Payer: BLUE CROSS/BLUE SHIELD | Admitting: Physician Assistant

## 2017-04-26 VITALS — BP 115/82 | HR 89 | Ht 73.0 in | Wt 270.0 lb

## 2017-04-26 DIAGNOSIS — E785 Hyperlipidemia, unspecified: Secondary | ICD-10-CM | POA: Diagnosis not present

## 2017-04-26 DIAGNOSIS — R072 Precordial pain: Secondary | ICD-10-CM | POA: Diagnosis not present

## 2017-04-26 DIAGNOSIS — I480 Paroxysmal atrial fibrillation: Secondary | ICD-10-CM

## 2017-04-26 DIAGNOSIS — I10 Essential (primary) hypertension: Secondary | ICD-10-CM | POA: Diagnosis not present

## 2017-04-26 DIAGNOSIS — I2510 Atherosclerotic heart disease of native coronary artery without angina pectoris: Secondary | ICD-10-CM | POA: Diagnosis not present

## 2017-04-26 MED ORDER — TAMSULOSIN HCL 0.4 MG PO CAPS: 0.4000 mg | ORAL_CAPSULE | Freq: Every day | ORAL | 2 refills | Status: DC

## 2017-04-26 MED ORDER — METOPROLOL SUCCINATE ER 100 MG PO TB24: 100.0000 mg | ORAL_TABLET | Freq: Every evening | ORAL | 3 refills | Status: DC

## 2017-04-26 NOTE — Progress Notes (Signed)
Cardiology Office Note    Date:  04/28/2017   ID:  Chad Ellis, DOB [birth year], MRN [id number]  PCP:  Cory Munch, PA-C  Cardiologist:  Dr. Martinique EP: Dr. Rayann Heman  Chief Complaint  Patient presents with  . Follow-up    seen for Dr. Martinique.     History of Present Illness:  Chad Ellis is a 57 y.o. male with PMH of HTN, HLD, h/o Dressler's syndrome, PAF on sotolol and Xarelto and CAD s/p DES to RCA 06/2016.  Patient was admitted in April 2018 with atrial fibrillation with RVR and elevated troponin.  Troponin peaked at the 6, [name]  2D echo showed normal LV function and mild LVH, mild to moderate RV dysfunction.  He was initially placed on Brilinta however this was later switched to Plavix due to ongoing atrial fibrillation and the need for Xarelto.  Dr. Rayann Heman saw the patient and also placed him on sotalol.  Later he developed pleuritic chest pain and was treated with colchicine.  Patient was last seen on 12/07/2016, at which time she was doing well.  Last lipid panel obtained on 12/07/2016 showed HDL 36, LDL 57, triglyceride 194, cholesterol 130.  Patient presents today for with multiple complaints.  For the past 2 months, he has been using sublingual nitroglycerin quite frequently due to recurrent chest pain.  His PCP has placed him on a hydrochlorothiazide for fluid, however he is having significant gout that is difficult to manage for the past month.  Instead of taking hydrochlorothiazide as needed, he is taking it daily. He says he urinate in multiple streams and does not feel like he can void completely with a single stream.  This is likely related to BPH.  I will start him on a trial of 0.4 mg daily of Flomax.  Otherwise he will continue on the Plavix and Xarelto.  After April of this year, he can potentially come off the Plavix to have the shoulder and neck treated.  He described his most  recent chest pains as a pressure-like sensation, however he did not have the same weakness he felt with the previous heart attack.  I will recommend a Lexiscan Myoview to further assess for ischemia.  I did give him a work note only until the stress test, after the stress test is done, if the result is normal, he can go back to work. He did spend majority of today visit worry about not being able to keep up with work and he says he felt he was released back to work too early after his last heart attack. He told him, he is closely to a year out from the heart attack, unless there is definitive prove that recurrence blockage has occurred, I cannot keep him out from work.     Past Medical History:  Diagnosis Date  . Anxiety   . Coronary artery disease    a. 06/2016: NSTEMI s/p DES to RCA  . Diverticulosis   . Dressler's syndrome ([place])   . Ectopic atrial rhythm   . HLD (hyperlipidemia)   . HTN (hypertension)   . PAF (paroxysmal atrial fibrillation) ([place])    a. 06/2016: Dx'd and started on Sotolol and Xarelto    Past Surgical History:  Procedure Laterality Date  . CHONDROPLASTY Right 05/10/2012   Procedure: CHONDROPLASTY;  Surgeon: Carole Civil, MD;  Location: [facility];  Service: Orthopedics;  Laterality:  Right;  of patella  . COLONOSCOPY    . CORONARY STENT INTERVENTION N/A 07/06/2016   Procedure: Coronary Stent Intervention;  Surgeon: Troy Sine, MD;  Location: [facility];  Service: Cardiovascular;  Laterality: N/A;  . ESOPHAGOGASTRODUODENOSCOPY    . EXAM UNDER ANESTHESIA WITH MANIPULATION OF KNEE Right 05/10/2012   Procedure: EXAM UNDER ANESTHESIA WITH MANIPULATION OF KNEE;  Surgeon: Carole Civil, MD;  Location: [facility];  Service: Orthopedics;  Laterality: Right;  start 0750 end 751  . KNEE ARTHROSCOPY WITH EXCISION PLICA Right 10/05/3662   Procedure: KNEE ARTHROSCOPY WITH EXCISION PLICA;  Surgeon: Carole Civil, MD;  Location: [facility];  Service: Orthopedics;   Laterality: Right;  . KNEE ARTHROSCOPY WITH MEDIAL MENISECTOMY Right 05/10/2012   Procedure: KNEE ARTHROSCOPY WITH MEDIAL MENISECTOMY;  Surgeon: Carole Civil, MD;  Location: [facility];  Service: Orthopedics;  Laterality: Right;  . LEFT HEART CATH AND CORONARY ANGIOGRAPHY N/A 07/06/2016   Procedure: Left Heart Cath and Coronary Angiography;  Surgeon: Troy Sine, MD;  Location: [facility];  Service: Cardiovascular;  Laterality: N/A;    Current Medications: Outpatient Medications Prior to Visit  Medication Sig Dispense Refill  . albuterol (PROVENTIL HFA;VENTOLIN HFA) 108 (90 Base) MCG/ACT inhaler Inhale 1-2 puffs into the lungs every 6 (six) hours as needed for wheezing or shortness of breath.    . ALPRAZolam (XANAX) 1 MG tablet Take 1 mg by mouth 3 (three) times daily as needed for anxiety or sleep.     Marland Kitchen atorvastatin (LIPITOR) 80 MG tablet TAKE 1 TABLET BY MOUTH DAILY AT 6PM 60 tablet 6  . clopidogrel (PLAVIX) 75 MG tablet Take 1 tablet (75 mg total) daily by mouth. 90 tablet 2  . hydrochlorothiazide (HYDRODIURIL) 25 MG tablet Take 25 mg by mouth daily as needed (for fluid).     Marland Kitchen lisinopril (PRINIVIL,ZESTRIL) 10 MG tablet TAKE 1 TABLET BY MOUTH ONCE DAILY 270 tablet 0  . nitroGLYCERIN (NITROSTAT) 0.4 MG SL tablet Place 1 tablet (0.4 mg total) under the tongue every 5 (five) minutes as needed for chest pain. 25 tablet 12  . predniSONE (STERAPRED UNI-PAK 21 TAB) 10 MG (21) TBPK tablet   0  . rivaroxaban (XARELTO) 20 MG TABS tablet Take 1 tablet (20 mg total) by mouth daily with supper. 90 tablet 4  . sotalol (BETAPACE) 120 MG tablet TAKE 1 TABLET BY MOUTH TWICE DAILY 60 tablet 9  . metoprolol succinate (TOPROL-XL) 50 MG 24 hr tablet Take 1 tablet (50 mg total) by mouth daily. Take with or immediately following a meal. 90 tablet 2  . acetaminophen (TYLENOL) 500 MG tablet Take 500 mg by mouth every 6 (six) hours as needed for moderate pain or headache.    Marland Kitchen HYDROcodone-acetaminophen  (NORCO) 5-325 MG per tablet Take 1 tablet by mouth every 6 (six) hours as needed for pain. 60 tablet 1   No facility-administered medications prior to visit.      Allergies:   Patient has no known allergies.   Social History   Socioeconomic History  . Marital status: Divorced    Spouse name: None  . Number of children: None  . Years of education: None  . Highest education level: None  Social Needs  . Financial resource strain: None  . Food insecurity - worry: None  . Food insecurity - inability: None  . Transportation needs - medical: None  . Transportation needs - non-medical: None  Occupational History  . None  Tobacco Use  . Smoking status: Never Smoker  . Smokeless tobacco: Never Used  Substance and Sexual Activity  . Alcohol use: Yes    Comment: almost daily use of brandy or beer  . Drug use: No  . Sexual activity: None  Other Topics Concern  . None  Social History Narrative  . None     Family History:  The patient's family history includes Atrial fibrillation in his mother; COPD in his father; Cholecystitis in his sister; Diabetes in his father; Mesothelioma in his father.   ROS:   Please see the history of present illness.    ROS All other systems reviewed and are negative.   PHYSICAL EXAM:   VS:  BP 115/82   Pulse 89   Ht 6\' 1"  (1.854 m)   Wt 270 lb (122.5 kg)   BMI 35.62 kg/m    GEN: Well nourished, well developed, in no acute distress  HEENT: normal  Neck: no JVD, carotid bruits, or masses Cardiac: RRR; no murmurs, rubs, or gallops,no edema  Respiratory:  clear to auscultation bilaterally, normal work of breathing GI: soft, nontender, nondistended, + BS MS: no deformity or atrophy  Skin: warm and dry, no rash Neuro:  Alert and Oriented x 3, Strength and sensation are intact Psych: euthymic mood, full affect  Wt Readings from Last 3 Encounters:  04/26/17 270 lb (122.5 kg)  12/07/16 272 lb (123.4 kg)  10/13/16 273 lb (123.8 kg)       Studies/Labs Reviewed:   EKG:  EKG is ordered today.  The ekg ordered today demonstrates normal sinus rhythm with nonspecific ST-T wave changes  Recent Labs: 07/06/2016: TSH 10.311 07/09/2016: B Natriuretic Peptide 245.8 10/13/2016: BUN 9; Creatinine, Ser 0.86; Hemoglobin 14.4; Magnesium 2.0; Platelets 181; Potassium 4.1; Sodium 138 12/07/2016: ALT 32   Lipid Panel    Component Value Date/Time   CHOL 130 12/07/2016 1057   TRIG 194 (H) 12/07/2016 1057   HDL 34 (L) 12/07/2016 1057   CHOLHDL 8.3 07/06/2016 0307   VLDL 63 (H) 07/06/2016 0307   LDLCALC 57 12/07/2016 1057    Additional studies/ records that were reviewed today include:   Cath 07/06/2016 Conclusion     1st Mrg lesion, 20 %stenosed.  Mid LAD lesion, 20 %stenosed.  A STENT SYNERGY DES [id number] drug eluting stent was successfully placed.  Prox RCA to Mid RCA lesion, 100 %stenosed.  Post intervention, there is a 0% residual stenosis.  The left ventricular ejection fraction is 45-50% by visual estimate.  There is mild left ventricular systolic dysfunction.   Acute/subacute very proximal RCA occlusion with extensive thrombus burden and TIMI 0 flow.  Mild nonobstructive concomitant CAD with 20% LAD stenosis and 20% circumflex narrowing and a large left circumflex vessel.  There was a hint of very minimal if any distal collateralization of the PLA vessel from the RCA via the left injection.  Difficult but successful PCI to the totally occluded RCA with extensive thrombus requiring thrombectomy, PTCA, and ultimate DES stenting with a 3.538 mm Synergy DES stent postdilated to 4.14 mm very proximally and 3.9-4.0 mm distally in a very large RCA that supplies the PDA and PLA vessel.  RECOMMENDATION: Dual antiplatelet therapy.  The patient will continue on Aggrastat for 18 hours post procedure.  The patient will be started on high potency statin therapy, and medical therapy post MI including ACE-I/ARB, and beta  blockerRx.      Echo 07/08/2016 LV EF: 55% -   60% Study Conclusions  -  Left ventricle: The cavity size was normal. Wall thickness was   increased in a pattern of mild LVH. Systolic function was normal.   The estimated ejection fraction was in the range of 55% to 60%.   Basal inferior akinesis. Doppler parameters are consistent with   abnormal left ventricular relaxation (grade 1 diastolic   dysfunction). - Aortic valve: There was no stenosis. - Aorta: Mildly dilated aortic root. Aortic root dimension: 38 mm   (ED). - Mitral valve: There was trivial regurgitation. - Left atrium: The atrium was mildly dilated. - Right ventricle: Mildly dilated RV with mild to moderately   decreased systolic function. The free wall is hypokinetic   compared to the apex. No PE on CTA, would be concerned for RV   infarction. - Right atrium: The atrium was mildly dilated. - Pulmonary arteries: No complete TR doppler jet so unable to   estimate PA systolic pressure. - Systemic veins: IVC measured 2.4 cm with > 50% respirophasic   variation, suggesting RA pressure 8 mmHg.  Impressions:  - Normal LV size with mild LV hypertrophy. EF 55-60%, basal   inferior akinesis. The RV was mildly dilated with mild to   moderate systolic dysfunction. Free wall was hypokinetic compared   to apex, in absence of PE would consider RV infarction. No   significant valvular abnormalities.    ASSESSMENT:    1. Precordial chest pain   2. Essential hypertension   3. Hyperlipidemia, unspecified hyperlipidemia type   4. PAF (paroxysmal atrial fibrillation) ([place])   5. Coronary artery disease involving native coronary artery of native heart without angina pectoris      PLAN:  In order of problems listed above:  1. Recurrent chest pain: Does not seems to correspond with exertion, however he does have baseline shortness of breath with exertion.  I recommend a Lexiscan Myoview.  I did give him a work note to hold  back on going back to work until after the stress test if it is negative.  He seems to be quite anxious and spend majority of the today's visit worry about keeping up with his work.  He says he was released back to work too early after his heart attack last time, however since he is a year out from the heart attack.  2. CAD: On Plavix given the need for NOAC, his PCI was on 07/06/2016, after 4/26 2019, he can potentially come off of Plavix.  He is not on aspirin due to the need for systemic anticoagulation  3. PAF: On sotalol and metoprolol.  Continue Xarelto 20 mg daily.  4. Hypertension: Blood pressure stable.  5. Hyperlipidemia: On Lipitor 80 mg daily.  Last lipid panel obtained in October 2018 showed a total cholesterol 130, triglyceride 194, HDL 34, LDL 57.  Would consider adding fenofibrate versus Lovaza on the next visit.    Medication Adjustments/Labs and Tests Ordered: Current medicines are reviewed at length with the patient today.  Concerns regarding medicines are outlined above.  Medication changes, Labs and Tests ordered today are listed in the Patient Instructions below. Patient Instructions  Your physician has recommended you make the following change in your medication:  Stop Hydrochlorothiazide  Increase Metoprolol Succinate to 100mg  nightly Start Tamsulosin "Flomax" 0.4mg  once daily   Your physician has requested that you have a lexiscan myoview. For further information please visit [email or web address]. Please follow instruction sheet, as given.    Your physician recommends that you schedule a follow-up appointment in: 2-3 months with  Dr. Martinique     Signed, [place], [place]  04/28/2017 11:30 AM    [address], [place], [place]  [address] Phone: [phone]; Fax: [phone]

## 2017-04-26 NOTE — Patient Instructions (Addendum)
Your physician has recommended you make the following change in your medication:  Stop Hydrochlorothiazide  Increase Metoprolol Succinate to 100mg  nightly Start Tamsulosin "Flomax" 0.4mg  once daily   Your physician has requested that you have a lexiscan myoview. For further information please visit [email or web address]. Please follow instruction sheet, as given.    Your physician recommends that you schedule a follow-up appointment in: 2-3 months with Dr. Martinique

## 2017-04-28 ENCOUNTER — Encounter: Payer: Self-pay | Admitting: Physician Assistant

## 2017-05-02 DIAGNOSIS — R7309 Other abnormal glucose: Secondary | ICD-10-CM | POA: Diagnosis not present

## 2017-05-23 ENCOUNTER — Telehealth (HOSPITAL_COMMUNITY): Payer: Self-pay

## 2017-05-23 NOTE — Telephone Encounter (Signed)
Encounter complete. 

## 2017-05-25 ENCOUNTER — Ambulatory Visit (HOSPITAL_COMMUNITY)
Admission: RE | Admit: 2017-05-25 | Discharge: 2017-05-25 | Disposition: A | Discharge status: Home / Self Care | Payer: BLUE CROSS/BLUE SHIELD | Source: Ambulatory Visit | Attending: Internal Medicine | Admitting: Internal Medicine

## 2017-05-25 DIAGNOSIS — I480 Paroxysmal atrial fibrillation: Secondary | ICD-10-CM | POA: Diagnosis not present

## 2017-05-25 DIAGNOSIS — I241 Dressler's syndrome: Secondary | ICD-10-CM | POA: Insufficient documentation

## 2017-05-25 DIAGNOSIS — I2510 Atherosclerotic heart disease of native coronary artery without angina pectoris: Secondary | ICD-10-CM | POA: Diagnosis not present

## 2017-05-25 DIAGNOSIS — R0602 Shortness of breath: Secondary | ICD-10-CM | POA: Diagnosis not present

## 2017-05-25 DIAGNOSIS — R5383 Other fatigue: Secondary | ICD-10-CM | POA: Insufficient documentation

## 2017-05-25 DIAGNOSIS — E663 Overweight: Secondary | ICD-10-CM | POA: Insufficient documentation

## 2017-05-25 DIAGNOSIS — R0609 Other forms of dyspnea: Secondary | ICD-10-CM | POA: Insufficient documentation

## 2017-05-25 DIAGNOSIS — Z6835 Body mass index (BMI) 35.0-35.9, adult: Secondary | ICD-10-CM | POA: Diagnosis not present

## 2017-05-25 DIAGNOSIS — I10 Essential (primary) hypertension: Secondary | ICD-10-CM | POA: Diagnosis not present

## 2017-05-25 DIAGNOSIS — R072 Precordial pain: Secondary | ICD-10-CM | POA: Diagnosis not present

## 2017-05-25 LAB — MYOCARDIAL PERFUSION IMAGING
CHL CUP NUCLEAR SDS: 1
CHL CUP NUCLEAR SRS: 2
CHL CUP NUCLEAR SSS: 3
LV dias vol: 132 mL (ref 62–150)
LV sys vol: 48 mL
Peak HR: 78 {beats}/min
Rest HR: 65 {beats}/min
TID: 1.41

## 2017-05-25 MED ORDER — REGADENOSON 0.4 MG/5ML IV SOLN
0.4000 mg | Freq: Once | INTRAVENOUS | Status: AC
  Administered 2017-05-25: 0.4 mg via INTRAVENOUS

## 2017-05-25 MED ORDER — TECHNETIUM TC 99M TETROFOSMIN IV KIT
10.4000 | PACK | Freq: Once | INTRAVENOUS | Status: AC | PRN
  Administered 2017-05-25: 10.4 via INTRAVENOUS
  Filled 2017-05-25: qty 11

## 2017-05-25 MED ORDER — TECHNETIUM TC 99M TETROFOSMIN IV KIT
30.6000 | PACK | Freq: Once | INTRAVENOUS | Status: AC | PRN
  Administered 2017-05-25: 30.6 via INTRAVENOUS
  Filled 2017-05-25: qty 31

## 2017-06-04 DIAGNOSIS — Z6837 Body mass index (BMI) 37.0-37.9, adult: Secondary | ICD-10-CM | POA: Diagnosis not present

## 2017-06-04 DIAGNOSIS — Z1389 Encounter for screening for other disorder: Secondary | ICD-10-CM | POA: Diagnosis not present

## 2017-06-04 DIAGNOSIS — F419 Anxiety disorder, unspecified: Secondary | ICD-10-CM | POA: Diagnosis not present

## 2017-06-15 ENCOUNTER — Telehealth: Payer: Self-pay

## 2017-06-15 NOTE — Telephone Encounter (Signed)
   [facility] Pre-operative Risk Assessment    Request for surgical clearance:  1. What type of surgery is being performed? Left Shoulder: SA-SAD, SA-DCR, SA-Labral Debridement, Capsulorrhaphy-anterior, SA-SLAP Lesion repair-posterior  2. When is this surgery scheduled? TBD   3. What type of clearance is required (medical clearance vs. Pharmacy clearance to hold med vs. Both)? BOTH  4. Are there any medications that need to be held prior to surgery and how long? Plavix, Xarelto  5. Practice name and name of physician performing surgery? [facility]  6. What is your office phone and fax number? [email or web address] [phone] Fax: [phone]  7. Anesthesia type (None, local, MAC, general) ? General & Intra Scalene Block   Meryl Crutch 06/15/2017, 8:35 AM  _________________________________________________________________   (provider comments below)

## 2017-06-18 NOTE — Telephone Encounter (Signed)
Reviewed pharmacy recommendations. Proceed with surgery.

## 2017-06-18 NOTE — Telephone Encounter (Signed)
Patient with diagnosis of atrial fibrillation on Xarelto for anticoagulation.    Procedure: left shoulder Date of procedure: TBD  CHADS2-VASc score of  2 (, HTN,  CAD, )  CrCl 168.2 Platelet count 181  Per office protocol, patient can hold Xarelto for 3 days prior to procedure.    For orthopedic procedures please be sure to resume therapeutic (not prophylactic) dosing.

## 2017-06-18 NOTE — Telephone Encounter (Signed)
   Primary Cardiologist: Peter Martinique, MD  Chart reviewed as part of pre-operative protocol coverage. Given past medical history and time since last visit, based on ACC/AHA guidelines, Chad Ellis would be at acceptable risk for the planned procedure without further cardiovascular testing. Had low risk stress test on 05/29/2017.  As patient is on Xarelto and Plavix, will route to Pharmacy concerning Xarelto. He will need to hold Plavix for 5 days prior to surgery.   I will route this recommendation to the requesting party via Epic fax function and remove from pre-op pool.  Please call with questions.  Jory Sims DNP, ANP, AACC  06/18/2017, 2:21 PM

## 2017-06-19 NOTE — Telephone Encounter (Signed)
Forwarded to requesting providers office via EPIC

## 2017-06-27 DIAGNOSIS — M1000 Idiopathic gout, unspecified site: Secondary | ICD-10-CM | POA: Diagnosis not present

## 2017-06-27 DIAGNOSIS — Z6837 Body mass index (BMI) 37.0-37.9, adult: Secondary | ICD-10-CM | POA: Diagnosis not present

## 2017-07-19 DIAGNOSIS — H40033 Anatomical narrow angle, bilateral: Secondary | ICD-10-CM | POA: Diagnosis not present

## 2017-07-19 DIAGNOSIS — G44219 Episodic tension-type headache, not intractable: Secondary | ICD-10-CM | POA: Diagnosis not present

## 2017-07-21 NOTE — Progress Notes (Deleted)
Cardiology Office Note    Date:  07/21/2017   ID:  Chad Ellis, DOB 1960/11/27, MRN [id number]  PCP:  Chad Munch, PA-C  Cardiologist:  Dr. Martinique EP: Dr. Rayann Ellis  No chief complaint on file.   History of Present Illness:  Chad Ellis is a 57 y.o. male with PMH of HTN, HLD, h/o Dressler's syndrome, PAF on sotolol and Xarelto and CAD s/p DES to RCA 06/2016.  Patient was admitted in April 2018 with atrial fibrillation with RVR and elevated troponin.  Troponin peaked at the 6, [name]  2D echo showed normal LV function and mild LVH, mild to moderate RV dysfunction.  He was initially placed on Brilinta however this was later switched to Plavix due to ongoing atrial fibrillation and the need for Xarelto.  Dr. Rayann Ellis saw the patient and also placed him on sotalol.  Later he developed pleuritic chest pain and was treated with colchicine.  He was seen by Chad Deforest PA-C in February  with multiple complaints.  He complained of recurrent chest pain, gout, and urinary frequency.  He was placed on Flomax. A Myoview study was done which was normal.  He has been cleared to come  off the Plavix to have the shoulder and neck treated.  He he was not felt to have any work restrictions at this point from a cardiac perspective.     Past Medical History:  Diagnosis Date  . Anxiety   . Coronary artery disease    a. 06/2016: NSTEMI s/p DES to RCA  . Diverticulosis   . Dressler's syndrome ([place])   . Ectopic atrial rhythm   . HLD (hyperlipidemia)   . HTN (hypertension)   . PAF (paroxysmal atrial fibrillation) ([place])    a. 06/2016: Dx'd and started on Sotolol and Xarelto    Past Surgical History:  Procedure Laterality Date  . CHONDROPLASTY Right 05/10/2012   Procedure: CHONDROPLASTY;  Surgeon: Chad Civil, MD;  Location: [facility];  Service: Orthopedics;  Laterality: Right;  of patella  . COLONOSCOPY    .  CORONARY STENT INTERVENTION N/A 07/06/2016   Procedure: Coronary Stent Intervention;  Surgeon: Chad Sine, MD;  Location: [facility];  Service: Cardiovascular;  Laterality: N/A;  . ESOPHAGOGASTRODUODENOSCOPY    . EXAM UNDER ANESTHESIA WITH MANIPULATION OF KNEE Right 05/10/2012   Procedure: EXAM UNDER ANESTHESIA WITH MANIPULATION OF KNEE;  Surgeon: Chad Civil, MD;  Location: [facility];  Service: Orthopedics;  Laterality: Right;  start 0750 end 751  . KNEE ARTHROSCOPY WITH EXCISION PLICA Right 06/19/8117   Procedure: KNEE ARTHROSCOPY WITH EXCISION PLICA;  Surgeon: Chad Civil, MD;  Location: [facility];  Service: Orthopedics;  Laterality: Right;  . KNEE ARTHROSCOPY WITH MEDIAL MENISECTOMY Right 05/10/2012   Procedure: KNEE ARTHROSCOPY WITH MEDIAL MENISECTOMY;  Surgeon: Chad Civil, MD;  Location: [facility];  Service: Orthopedics;  Laterality: Right;  . LEFT HEART CATH AND CORONARY ANGIOGRAPHY N/A 07/06/2016   Procedure: Left Heart Cath and Coronary Angiography;  Surgeon: Chad Sine, MD;  Location: [facility];  Service: Cardiovascular;  Laterality: N/A;    Current Medications: Outpatient Medications Prior to Visit  Medication Sig Dispense Refill  . albuterol (PROVENTIL HFA;VENTOLIN HFA) 108 (90 Base) MCG/ACT inhaler Inhale 1-2 puffs into the lungs every 6 (six) hours as needed for wheezing or shortness of breath.    . ALPRAZolam (  XANAX) 1 MG tablet Take 1 mg by mouth 3 (three) times daily as needed for anxiety or sleep.     Marland Kitchen atorvastatin (LIPITOR) 80 MG tablet TAKE 1 TABLET BY MOUTH DAILY AT 6PM 60 tablet 6  . clopidogrel (PLAVIX) 75 MG tablet Take 1 tablet (75 mg total) daily by mouth. 90 tablet 2  . hydrochlorothiazide (HYDRODIURIL) 25 MG tablet Take 25 mg by mouth daily as needed (for fluid).     Marland Kitchen lisinopril (PRINIVIL,ZESTRIL) 10 MG tablet TAKE 1 TABLET BY MOUTH ONCE DAILY 270 tablet 0  . metoprolol succinate (TOPROL-XL) 100 MG 24 hr tablet Take 1 tablet (100 mg  total) by mouth every evening. Take with or immediately following a meal. 90 tablet 3  . nitroGLYCERIN (NITROSTAT) 0.4 MG SL tablet Place 1 tablet (0.4 mg total) under the tongue every 5 (five) minutes as needed for chest pain. 25 tablet 12  . predniSONE (STERAPRED UNI-PAK 21 TAB) 10 MG (21) TBPK tablet   0  . rivaroxaban (XARELTO) 20 MG TABS tablet Take 1 tablet (20 mg total) by mouth daily with supper. 90 tablet 4  . sotalol (BETAPACE) 120 MG tablet TAKE 1 TABLET BY MOUTH TWICE DAILY 60 tablet 9  . tamsulosin (FLOMAX) 0.4 MG CAPS capsule Take 1 capsule (0.4 mg total) by mouth daily. 30 capsule 2   No facility-administered medications prior to visit.      Allergies:   Patient has no known allergies.   Social History   Socioeconomic History  . Marital status: Divorced    Spouse name: Not on file  . Number of children: Not on file  . Years of education: Not on file  . Highest education level: Not on file  Occupational History  . Not on file  Social Needs  . Financial resource strain: Not on file  . Food insecurity:    Worry: Not on file    Inability: Not on file  . Transportation needs:    Medical: Not on file    Non-medical: Not on file  Tobacco Use  . Smoking status: Never Smoker  . Smokeless tobacco: Never Used  Substance and Sexual Activity  . Alcohol use: Yes    Comment: almost daily use of brandy or beer  . Drug use: No  . Sexual activity: Not on file  Lifestyle  . Physical activity:    Days per week: Not on file    Minutes per session: Not on file  . Stress: Not on file  Relationships  . Social connections:    Talks on phone: Not on file    Gets together: Not on file    Attends religious service: Not on file    Active member of club or organization: Not on file    Attends meetings of clubs or organizations: Not on file    Relationship status: Not on file  Other Topics Concern  . Not on file  Social History Narrative  . Not on file     Family History:   The patient's family history includes Atrial fibrillation in his mother; COPD in his father; Cholecystitis in his sister; Diabetes in his father; Mesothelioma in his father.   ROS:   Please see the history of present illness.    ROS All other systems reviewed and are negative.   PHYSICAL EXAM:   VS:  There were no vitals taken for this visit.   GEN: Well nourished, well developed, in no acute distress  HEENT: normal  Neck:  no JVD, carotid bruits, or masses Cardiac: RRR; no murmurs, rubs, or gallops,no edema  Respiratory:  clear to auscultation bilaterally, normal work of breathing GI: soft, nontender, nondistended, + BS MS: no deformity or atrophy  Skin: warm and dry, no rash Neuro:  Alert and Oriented x 3, Strength and sensation are intact Psych: euthymic mood, full affect  Wt Readings from Last 3 Encounters:  05/25/17 270 lb (122.5 kg)  04/26/17 270 lb (122.5 kg)  12/07/16 272 lb (123.4 kg)      Studies/Labs Reviewed:   EKG:  EKG is ordered today.  The ekg ordered today demonstrates normal sinus rhythm with nonspecific ST-T wave changes  Recent Labs: 10/13/2016: BUN 9; Creatinine, Ser 0.86; Hemoglobin 14.4; Magnesium 2.0; Platelets 181; Potassium 4.1; Sodium 138 12/07/2016: ALT 32   Lipid Panel    Component Value Date/Time   CHOL 130 12/07/2016 1057   TRIG 194 (H) 12/07/2016 1057   HDL 34 (L) 12/07/2016 1057   CHOLHDL 8.3 07/06/2016 0307   VLDL 63 (H) 07/06/2016 0307   LDLCALC 57 12/07/2016 1057    Additional studies/ records that were reviewed today include:   Cath 07/06/2016 Conclusion     1st Mrg lesion, 20 %stenosed.  Mid LAD lesion, 20 %stenosed.  A STENT SYNERGY DES 3.5X38 drug eluting stent was successfully placed.  Prox RCA to Mid RCA lesion, 100 %stenosed.  Post intervention, there is a 0% residual stenosis.  The left ventricular ejection fraction is 45-50% by visual estimate.  There is mild left ventricular systolic dysfunction.     Acute/subacute very proximal RCA occlusion with extensive thrombus burden and TIMI 0 flow.  Mild nonobstructive concomitant CAD with 20% LAD stenosis and 20% circumflex narrowing and a large left circumflex vessel.  There was a hint of very minimal if any distal collateralization of the PLA vessel from the RCA via the left injection.  Difficult but successful PCI to the totally occluded RCA with extensive thrombus requiring thrombectomy, PTCA, and ultimate DES stenting with a 3.538 mm Synergy DES stent postdilated to 4.14 mm very proximally and 3.9-4.0 mm distally in a very large RCA that supplies the PDA and PLA vessel.  RECOMMENDATION: Dual antiplatelet therapy.  The patient will continue on Aggrastat for 18 hours post procedure.  The patient will be started on high potency statin therapy, and medical therapy post MI including ACE-I/ARB, and beta blockerRx.      Echo 07/08/2016 LV EF: 55% -   60% Study Conclusions  - Left ventricle: The cavity size was normal. Wall thickness was   increased in a pattern of mild LVH. Systolic function was normal.   The estimated ejection fraction was in the range of 55% to 60%.   Basal inferior akinesis. Doppler parameters are consistent with   abnormal left ventricular relaxation (grade 1 diastolic   dysfunction). - Aortic valve: There was no stenosis. - Aorta: Mildly dilated aortic root. Aortic root dimension: 38 mm   (ED). - Mitral valve: There was trivial regurgitation. - Left atrium: The atrium was mildly dilated. - Right ventricle: Mildly dilated RV with mild to moderately   decreased systolic function. The free wall is hypokinetic   compared to the apex. No PE on CTA, would be concerned for RV   infarction. - Right atrium: The atrium was mildly dilated. - Pulmonary arteries: No complete TR doppler jet so unable to   estimate PA systolic pressure. - Systemic veins: IVC measured 2.4 cm with > 50% respirophasic  variation, suggesting RA  pressure 8 mmHg.  Impressions:  - Normal LV size with mild LV hypertrophy. EF 55-60%, basal   inferior akinesis. The RV was mildly dilated with mild to   moderate systolic dysfunction. Free wall was hypokinetic compared   to apex, in absence of PE would consider RV infarction. No   significant valvular abnormalities.  Myoview 05/24/17: Study Highlights     The left ventricular ejection fraction is normal (55-65%).  Nuclear stress EF: 64%.  There was no ST segment deviation noted during stress.  The study is normal.   1. EF 64%, normal wall motion.  2. No evidence for ischemia or infarction on perfusion images.   Normal study.      ASSESSMENT:    No diagnosis found.   PLAN:  In order of problems listed above:  1. CAD: On Plavix given the need for NOAC, his PCI was on 07/06/2016. Normal Myoview in March 2019.  He is not on aspirin due to the need for systemic anticoagulation  2. PAF: On sotalol and metoprolol.  Continue Xarelto 20 mg daily.  3. Hypertension: Blood pressure stable.  4. Hyperlipidemia: On Lipitor 80 mg daily.  Last lipid panel obtained in October 2018 showed a total cholesterol 130, triglyceride 194, HDL 34, LDL 57.  Would consider adding fenofibrate versus Lovaza on the next visit.    Medication Adjustments/Labs and Tests Ordered: Current medicines are reviewed at length with the patient today.  Concerns regarding medicines are outlined above.  Medication changes, Labs and Tests ordered today are listed in the Patient Instructions below. There are no Patient Instructions on file for this visit.   Signed, Peter Martinique, MD  07/21/2017 1:20 PM    [facility] [address], [place], [place]  [address] Phone: [phone]; Fax: [phone]

## 2017-07-25 ENCOUNTER — Ambulatory Visit: Payer: BLUE CROSS/BLUE SHIELD | Admitting: Cardiology

## 2017-08-19 ENCOUNTER — Other Ambulatory Visit: Payer: Self-pay | Admitting: Physician Assistant

## 2017-10-25 ENCOUNTER — Other Ambulatory Visit: Payer: Self-pay | Admitting: Physician Assistant

## 2017-11-02 ENCOUNTER — Other Ambulatory Visit: Payer: Self-pay | Admitting: Physician Assistant

## 2017-11-06 DIAGNOSIS — Z6835 Body mass index (BMI) 35.0-35.9, adult: Secondary | ICD-10-CM | POA: Diagnosis not present

## 2017-11-06 DIAGNOSIS — M109 Gout, unspecified: Secondary | ICD-10-CM | POA: Diagnosis not present

## 2017-11-06 DIAGNOSIS — Z1389 Encounter for screening for other disorder: Secondary | ICD-10-CM | POA: Diagnosis not present

## 2017-11-06 DIAGNOSIS — E6609 Other obesity due to excess calories: Secondary | ICD-10-CM | POA: Diagnosis not present

## 2017-11-23 ENCOUNTER — Other Ambulatory Visit: Payer: Self-pay

## 2017-11-23 ENCOUNTER — Other Ambulatory Visit: Payer: Self-pay | Admitting: Cardiology

## 2017-11-23 MED ORDER — SOTALOL HCL 120 MG PO TABS: 120.0000 mg | ORAL_TABLET | Freq: Two times a day (BID) | ORAL | 3 refills | Status: DC

## 2018-01-13 ENCOUNTER — Other Ambulatory Visit: Payer: Self-pay | Admitting: Cardiology

## 2018-01-14 NOTE — Telephone Encounter (Signed)
Rx(s) sent to pharmacy electronically.  

## 2018-01-15 ENCOUNTER — Ambulatory Visit (INDEPENDENT_AMBULATORY_CARE_PROVIDER_SITE_OTHER): Payer: BLUE CROSS/BLUE SHIELD | Admitting: Cardiology

## 2018-01-15 ENCOUNTER — Encounter: Payer: Self-pay | Admitting: Cardiology

## 2018-01-15 VITALS — BP 134/91 | HR 87 | Ht 73.0 in | Wt 269.6 lb

## 2018-01-15 DIAGNOSIS — I10 Essential (primary) hypertension: Secondary | ICD-10-CM | POA: Diagnosis not present

## 2018-01-15 DIAGNOSIS — E785 Hyperlipidemia, unspecified: Secondary | ICD-10-CM | POA: Diagnosis not present

## 2018-01-15 DIAGNOSIS — I480 Paroxysmal atrial fibrillation: Secondary | ICD-10-CM

## 2018-01-15 DIAGNOSIS — I25118 Atherosclerotic heart disease of native coronary artery with other forms of angina pectoris: Secondary | ICD-10-CM

## 2018-01-15 MED ORDER — RIVAROXABAN 20 MG PO TABS: 20.0000 mg | ORAL_TABLET | Freq: Every day | ORAL | 4 refills | Status: DC

## 2018-01-15 NOTE — Progress Notes (Signed)
Cardiology Office Note    Date:  01/15/2018   ID:  Chad Ellis, DOB [birth year], MRN [id number]  PCP:  Chad Munch, PA-C  Cardiologist:  Dr. Martinique EP: Dr. Rayann Ellis  No chief complaint on file.   History of Present Illness:  Chad Ellis is a 57 y.o. male with PMH of HTN, HLD, h/o Dressler's syndrome, PAF on sotolol and Xarelto and CAD s/p DES to RCA 06/2016.  Patient was admitted in April 2018 with atrial fibrillation with RVR and elevated troponin.  Troponin peaked at the 6, [name]  2D echo showed normal LV function and mild LVH, mild to moderate RV dysfunction.  He was initially placed on Brilinta however this was later switched to Plavix due to ongoing atrial fibrillation and the need for Xarelto.  Dr. Rayann Ellis saw the patient and also placed him on sotalol.  Later he developed pleuritic chest pain and was treated with colchicine.  He was last seen by Chad Deforest PA-C early this year. Complained of chest pain. Myoview was ordered and was normal. Now has infrequent chest pressure usually relieved with belching but he has taken sl Ntg a couple of times. He had rotator cuff surgery in April. Has cervical spine disease as well. Not working due to this. Complains of low energy. Did have gout bad last winter but this has improved off HCTZ. He is walking 3x/week.   Past Medical History:  Diagnosis Date  . Anxiety   . Coronary artery disease    a. 06/2016: NSTEMI s/p DES to RCA  . Diverticulosis   . Dressler's syndrome ([place])   . Ectopic atrial rhythm   . HLD (hyperlipidemia)   . HTN (hypertension)   . PAF (paroxysmal atrial fibrillation) ([place])    a. 06/2016: Dx'd and started on Sotolol and Xarelto    Past Surgical History:  Procedure Laterality Date  . CHONDROPLASTY Right 05/10/2012   Procedure: CHONDROPLASTY;  Surgeon: Chad Civil, MD;  Location: [facility];  Service: Orthopedics;   Laterality: Right;  of patella  . COLONOSCOPY    . CORONARY STENT INTERVENTION N/A 07/06/2016   Procedure: Coronary Stent Intervention;  Surgeon: Chad Sine, MD;  Location: [facility];  Service: Cardiovascular;  Laterality: N/A;  . ESOPHAGOGASTRODUODENOSCOPY    . EXAM UNDER ANESTHESIA WITH MANIPULATION OF KNEE Right 05/10/2012   Procedure: EXAM UNDER ANESTHESIA WITH MANIPULATION OF KNEE;  Surgeon: Chad Civil, MD;  Location: [facility];  Service: Orthopedics;  Laterality: Right;  start 0750 end 751  . KNEE ARTHROSCOPY WITH EXCISION PLICA Right 3/47/4259   Procedure: KNEE ARTHROSCOPY WITH EXCISION PLICA;  Surgeon: Chad Civil, MD;  Location: [facility];  Service: Orthopedics;  Laterality: Right;  . KNEE ARTHROSCOPY WITH MEDIAL MENISECTOMY Right 05/10/2012   Procedure: KNEE ARTHROSCOPY WITH MEDIAL MENISECTOMY;  Surgeon: Chad Civil, MD;  Location: [facility];  Service: Orthopedics;  Laterality: Right;  . LEFT HEART CATH AND CORONARY ANGIOGRAPHY N/A 07/06/2016   Procedure: Left Heart Cath and Coronary Angiography;  Surgeon: Chad Sine, MD;  Location: [facility];  Service: Cardiovascular;  Laterality: N/A;    Current Medications: Outpatient Medications Prior to Visit  Medication Sig Dispense Refill  . albuterol (PROVENTIL HFA;VENTOLIN HFA) 108 (90 Base) MCG/ACT inhaler Inhale 1-2 puffs into the lungs every 6 (six) hours as needed for wheezing or shortness of breath.    Marland Kitchen  allopurinol (ZYLOPRIM) 300 MG tablet Take 300 mg by mouth daily.    Marland Kitchen ALPRAZolam (XANAX) 1 MG tablet Take 1 mg by mouth 3 (three) times daily as needed for anxiety or sleep.     Marland Kitchen atorvastatin (LIPITOR) 80 MG tablet TAKE 1 TABLET BY MOUTH DAILY AT 6PM 60 tablet 6  . clopidogrel (PLAVIX) 75 MG tablet TAKE 1 TABLET (75 MG TOTAL) DAILY BY MOUTH. 90 tablet 2  . colchicine 0.6 MG tablet Take 0.6 mg by mouth daily.    . cyclobenzaprine (FLEXERIL) 10 MG tablet Take 10 mg by mouth daily.    . diphenhydrAMINE  HCl (BENADRYL ALLERGY PO) Take by mouth.    . hydrochlorothiazide (HYDRODIURIL) 25 MG tablet Take 25 mg by mouth daily as needed (for fluid).     Marland Kitchen lisinopril (PRINIVIL,ZESTRIL) 10 MG tablet TAKE 1 TABLET BY MOUTH EVERY DAY 90 tablet 2  . metoprolol succinate (TOPROL-XL) 100 MG 24 hr tablet Take 1 tablet (100 mg total) by mouth every evening. Take with or immediately following a meal. 90 tablet 3  . predniSONE (STERAPRED UNI-PAK 21 TAB) 10 MG (21) TBPK tablet   0  . rivaroxaban (XARELTO) 20 MG TABS tablet Take 1 tablet (20 mg total) by mouth daily with supper. 90 tablet 4  . sotalol (BETAPACE) 120 MG tablet Take 1 tablet (120 mg total) by mouth 2 (two) times daily. 180 tablet 3  . tamsulosin (FLOMAX) 0.4 MG CAPS capsule TAKE 1 CAPSULE BY MOUTH ONCE DAILY 30 capsule 2  . nitroGLYCERIN (NITROSTAT) 0.4 MG SL tablet Place 1 tablet (0.4 mg total) under the tongue every 5 (five) minutes as needed for chest pain. (Patient not taking: Reported on 01/15/2018) 25 tablet 12   No facility-administered medications prior to visit.      Allergies:   Patient has no known allergies.   Social History   Socioeconomic History  . Marital status: Divorced    Spouse name: Not on file  . Number of children: Not on file  . Years of education: Not on file  . Highest education level: Not on file  Occupational History  . Not on file  Social Needs  . Financial resource strain: Not on file  . Food insecurity:    Worry: Not on file    Inability: Not on file  . Transportation needs:    Medical: Not on file    Non-medical: Not on file  Tobacco Use  . Smoking status: Never Smoker  . Smokeless tobacco: Never Used  Substance and Sexual Activity  . Alcohol use: Yes    Comment: almost daily use of brandy or beer  . Drug use: No  . Sexual activity: Not on file  Lifestyle  . Physical activity:    Days per week: Not on file    Minutes per session: Not on file  . Stress: Not on file  Relationships  . Social  connections:    Talks on phone: Not on file    Gets together: Not on file    Attends religious service: Not on file    Active member of club or organization: Not on file    Attends meetings of clubs or organizations: Not on file    Relationship status: Not on file  Other Topics Concern  . Not on file  Social History Narrative  . Not on file     Family History:  The patient's family history includes Atrial fibrillation in his mother; COPD in his father; Cholecystitis  in his sister; Diabetes in his father; Mesothelioma in his father.   ROS:   Please see the history of present illness.    ROS All other systems reviewed and are negative.   PHYSICAL EXAM:   VS:  BP (!) 134/91   Pulse 87   Ht 6\' 1"  (1.854 m)   Wt 269 lb 9.6 oz (122.3 kg)   BMI 35.57 kg/m    GENERAL:  Well appearing, overweight WM in NAD HEENT:  PERRL, EOMI, sclera are clear. Oropharynx is clear. NECK:  No jugular venous distention, carotid upstroke brisk and symmetric, no bruits, no thyromegaly or adenopathy LUNGS:  Clear to auscultation bilaterally CHEST:  Unremarkable HEART:  RRR,  PMI not displaced or sustained,S1 and S2 within normal limits, no S3, no S4: no clicks, no rubs, no murmurs ABD:  Soft, nontender. BS +, no masses or bruits. No hepatomegaly, no splenomegaly EXT:  2 + pulses throughout, no edema, no cyanosis no clubbing SKIN:  Warm and dry.  No rashes NEURO:  Alert and oriented x 3. Cranial nerves II through XII intact. PSYCH:  Cognitively intact    Wt Readings from Last 3 Encounters:  01/15/18 269 lb 9.6 oz (122.3 kg)  05/25/17 270 lb (122.5 kg)  04/26/17 270 lb (122.5 kg)      Studies/Labs Reviewed:   EKG:  EKG is not ordered today.   Recent Labs: No results found for requested labs within last 8760 hours.   Lipid Panel    Component Value Date/Time   CHOL 130 12/07/2016 1057   TRIG 194 (H) 12/07/2016 1057   HDL 34 (L) 12/07/2016 1057   CHOLHDL 8.3 07/06/2016 0307   VLDL 63 (H)  07/06/2016 0307   LDLCALC 57 12/07/2016 1057    Additional studies/ records that were reviewed today include:   Cath 07/06/2016 Conclusion     1st Mrg lesion, 20 %stenosed.  Mid LAD lesion, 20 %stenosed.  A STENT SYNERGY DES 3.5X38 drug eluting stent was successfully placed.  Prox RCA to Mid RCA lesion, 100 %stenosed.  Post intervention, there is a 0% residual stenosis.  The left ventricular ejection fraction is 45-50% by visual estimate.  There is mild left ventricular systolic dysfunction.   Acute/subacute very proximal RCA occlusion with extensive thrombus burden and TIMI 0 flow.  Mild nonobstructive concomitant CAD with 20% LAD stenosis and 20% circumflex narrowing and a large left circumflex vessel.  There was a hint of very minimal if any distal collateralization of the PLA vessel from the RCA via the left injection.  Difficult but successful PCI to the totally occluded RCA with extensive thrombus requiring thrombectomy, PTCA, and ultimate DES stenting with a 3.538 mm Synergy DES stent postdilated to 4.14 mm very proximally and 3.9-4.0 mm distally in a very large RCA that supplies the PDA and PLA vessel.  RECOMMENDATION: Dual antiplatelet therapy.  The patient will continue on Aggrastat for 18 hours post procedure.  The patient will be started on high potency statin therapy, and medical therapy post MI including ACE-I/ARB, and beta blockerRx.      Echo 07/08/2016 LV EF: 55% -   60% Study Conclusions  - Left ventricle: The cavity size was normal. Wall thickness was   increased in a pattern of mild LVH. Systolic function was normal.   The estimated ejection fraction was in the range of 55% to 60%.   Basal inferior akinesis. Doppler parameters are consistent with   abnormal left ventricular relaxation (grade 1 diastolic  dysfunction). - Aortic valve: There was no stenosis. - Aorta: Mildly dilated aortic root. Aortic root dimension: 38 mm   (ED). - Mitral  valve: There was trivial regurgitation. - Left atrium: The atrium was mildly dilated. - Right ventricle: Mildly dilated RV with mild to moderately   decreased systolic function. The free wall is hypokinetic   compared to the apex. No PE on CTA, would be concerned for RV   infarction. - Right atrium: The atrium was mildly dilated. - Pulmonary arteries: No complete TR doppler jet so unable to   estimate PA systolic pressure. - Systemic veins: IVC measured 2.4 cm with > 50% respirophasic   variation, suggesting RA pressure 8 mmHg.  Impressions:  - Normal LV size with mild LV hypertrophy. EF 55-60%, basal   inferior akinesis. The RV was mildly dilated with mild to   moderate systolic dysfunction. Free wall was hypokinetic compared   to apex, in absence of PE would consider RV infarction. No   significant valvular abnormalities.  Myoview 05/25/17: Study Highlights     The left ventricular ejection fraction is normal (55-65%).  Nuclear stress EF: 64%.  There was no ST segment deviation noted during stress.  The study is normal.   1. EF 64%, normal wall motion.  2. No evidence for ischemia or infarction on perfusion images.   Normal study.      ASSESSMENT:    No diagnosis found.   PLAN:  In order of problems listed above:   1. CAD: On Plavix given the need for NOAC. No ASA. Normal Myoview in March 2019. Infrequent atypical chest pain.  2. PAF: On sotalol and metoprolol. Good control. In NSR on exam Continue Xarelto 20 mg daily.  3. Hypertension: Blood pressure stable.  4. Hyperlipidemia: On Lipitor 80 mg daily.  Will request a copy of last lab work from primary care.   Follow up in 6 months.     Medication Adjustments/Labs and Tests Ordered: Current medicines are reviewed at length with the patient today.  Concerns regarding medicines are outlined above.  Medication changes, Labs and Tests ordered today are listed in the Patient Instructions below. There are  no Patient Instructions on file for this visit.   Signed, Peter Martinique, MD  01/15/2018 2:25 PM    [facility] [address], [place], [place]  [address] Phone: [phone]; Fax: [phone]

## 2018-01-15 NOTE — Patient Instructions (Signed)
Continue your current therapy  We will request a copy of your lab work from [facility].  Follow up in 6 months.

## 2018-02-13 DIAGNOSIS — M109 Gout, unspecified: Secondary | ICD-10-CM | POA: Diagnosis not present

## 2018-02-13 DIAGNOSIS — Z6836 Body mass index (BMI) 36.0-36.9, adult: Secondary | ICD-10-CM | POA: Diagnosis not present

## 2018-02-13 DIAGNOSIS — E6609 Other obesity due to excess calories: Secondary | ICD-10-CM | POA: Diagnosis not present

## 2018-02-13 DIAGNOSIS — F419 Anxiety disorder, unspecified: Secondary | ICD-10-CM | POA: Diagnosis not present

## 2018-02-22 ENCOUNTER — Other Ambulatory Visit: Payer: Self-pay | Admitting: Cardiology

## 2018-02-22 ENCOUNTER — Other Ambulatory Visit: Payer: Self-pay | Admitting: *Deleted

## 2018-02-22 MED ORDER — ATORVASTATIN CALCIUM 80 MG PO TABS: ORAL_TABLET | ORAL | 6 refills | Status: DC

## 2018-02-26 ENCOUNTER — Other Ambulatory Visit: Payer: Self-pay | Admitting: Cardiology

## 2018-07-09 ENCOUNTER — Telehealth: Payer: Self-pay | Admitting: *Deleted

## 2018-07-09 DIAGNOSIS — I480 Paroxysmal atrial fibrillation: Secondary | ICD-10-CM

## 2018-07-09 NOTE — Telephone Encounter (Signed)
07/09/18 LMOM @ 11:14 am, TP:[id number] up appointment.

## 2018-07-11 ENCOUNTER — Telehealth: Payer: Self-pay | Admitting: Cardiology

## 2018-07-11 NOTE — Telephone Encounter (Signed)
Returned call to patient he stated he is almost out of Xarelto 20 mg.Stated he is not able to work and has no insurance.He saw Mary Sella today who is with the prescription assistance program at [facility] center at [place] for help with his medications.She told him she would be in contact with me.Advised I will call her next week if I don't hear from her.Advised I messaged a nurse who is at our office and she will leave Xarelto 20 mg samples at front desk.Virtual appointment scheduled with Dr.Jordan 07/17/18 at 2:40 pm.

## 2018-07-11 NOTE — Telephone Encounter (Signed)
Pt calling again stating that he is unable to get his Xarelto medication because he is not working and has no insurance and is almost out of medication. Pt would like a call back as soon as possible at [phone]. Please address

## 2018-07-11 NOTE — Telephone Encounter (Signed)
New Message    Pt is calling because he is going to the health department in [place] and he wants his prescription emailed for Xarelto  Pt says he is almost out of the medication and doesn't have insurance     Please call

## 2018-07-15 ENCOUNTER — Other Ambulatory Visit: Payer: Self-pay | Admitting: Physician Assistant

## 2018-07-17 ENCOUNTER — Telehealth: Payer: BLUE CROSS/BLUE SHIELD | Admitting: Cardiology

## 2018-07-17 DIAGNOSIS — L309 Dermatitis, unspecified: Secondary | ICD-10-CM | POA: Diagnosis not present

## 2018-07-17 DIAGNOSIS — F419 Anxiety disorder, unspecified: Secondary | ICD-10-CM | POA: Diagnosis not present

## 2018-07-17 NOTE — Telephone Encounter (Signed)
Called Mary Sella with patient assistance program at [facility] no answer.[place].

## 2018-07-23 NOTE — Telephone Encounter (Signed)
Received form from [facility].Form faxed back at fax # [phone] with Dr.Jordan's 01/15/18 office note with a list of patient's medications, to attention Mary Sella.

## 2018-08-02 ENCOUNTER — Other Ambulatory Visit: Payer: Self-pay | Admitting: Cardiology

## 2018-08-02 ENCOUNTER — Other Ambulatory Visit: Payer: Self-pay | Admitting: Physician Assistant

## 2018-08-02 MED ORDER — CLOPIDOGREL BISULFATE 75 MG PO TABS: 75.0000 mg | ORAL_TABLET | Freq: Every day | ORAL | 0 refills | Status: DC

## 2018-08-02 NOTE — Telephone Encounter (Signed)
°*  STAT* If patient is at the pharmacy, call can be transferred to refill team.   1. Which medications need to be refilled? (please list name of each medication and dose if known) clopidogrel (PLAVIX) 75 MG tablet rivaroxaban (XARELTO) 20 MG TABS tablet  2. Which pharmacy/location (including street and city if local pharmacy) is medication to be sent to? [id number] [place] [place] [address]  3. Do they need a 30 day or 90 day supply? 90 days

## 2018-08-06 MED ORDER — RIVAROXABAN 20 MG PO TABS: 20.0000 mg | ORAL_TABLET | Freq: Every day | ORAL | 0 refills | Status: DC

## 2018-08-06 NOTE — Telephone Encounter (Signed)
Xarelto 20mg  Rx ; 20 day supply sent ton [facility] today 08/06/2018.   Lab orders for BMEt and CBC mailed to patient to complete ASAP.

## 2018-08-14 LAB — CBC
Hematocrit: 43.9 % (ref 37.5–51.0)
Hemoglobin: 15 g/dL (ref 13.0–17.7)
MCH: 32.5 pg (ref 26.6–33.0)
MCHC: 34.2 g/dL (ref 31.5–35.7)
MCV: 95 fL (ref 79–97)
Platelets: 276 10*3/uL (ref 150–450)
RBC: 4.61 x10E6/uL (ref 4.14–5.80)
RDW: 13.5 % (ref 11.6–15.4)
WBC: 7.8 10*3/uL (ref 3.4–10.8)

## 2018-08-14 LAB — BASIC METABOLIC PANEL
BUN/Creatinine Ratio: 17 (ref 9–20)
BUN: 15 mg/dL (ref 6–24)
CO2: 26 mmol/L (ref 20–29)
Calcium: 9.5 mg/dL (ref 8.7–10.2)
Chloride: 101 mmol/L (ref 96–106)
Creatinine, Ser: 0.88 mg/dL (ref 0.76–1.27)
GFR calc Af Amer: 111 mL/min/{1.73_m2} (ref >59)
GFR calc non Af Amer: 96 mL/min/{1.73_m2} (ref >59)
Glucose: 98 mg/dL (ref 65–99)
Potassium: 4.8 mmol/L (ref 3.5–5.2)
Sodium: 140 mmol/L (ref 134–144)

## 2018-08-19 ENCOUNTER — Telehealth: Payer: Self-pay | Admitting: Cardiology

## 2018-08-19 NOTE — Telephone Encounter (Signed)
Returned call to patient no answer.LMTC. 

## 2018-08-19 NOTE — Telephone Encounter (Signed)
Patient called and wanted Chad Ellis know that Dr. Martinique still needed to send over a rx so he can get assistance with his medication.

## 2018-08-20 NOTE — Telephone Encounter (Signed)
Returned call to patient advised I spoke to Mary Sella with [facility] medication assistance program  # [phone] she stated you had refills on all medications you just needed a refill for proventil inhaler.She will send to PCP Jayme Cloud PA.

## 2018-08-20 NOTE — Telephone Encounter (Signed)
Pt returning [facility] [place] call. Pt would like a call back at [phone]. Please address

## 2018-08-24 ENCOUNTER — Other Ambulatory Visit: Payer: Self-pay | Admitting: Cardiology

## 2018-08-25 ENCOUNTER — Other Ambulatory Visit: Payer: Self-pay | Admitting: Cardiology

## 2018-08-26 NOTE — Telephone Encounter (Signed)
Pt is a 81 yom wt of 122.3kg, scr of 0.88 (08/13/18), lov w/Dr. Martinique (08/19/18) ... ccr: 162 ml/min pt requesting 20mg  xarelto refill granted

## 2018-08-27 ENCOUNTER — Other Ambulatory Visit: Payer: Self-pay

## 2018-08-28 ENCOUNTER — Other Ambulatory Visit: Payer: Self-pay | Admitting: Cardiology

## 2018-08-28 NOTE — Telephone Encounter (Signed)
Pt is a 39yom requesting xarelto 20mg , pt has a wt of 122.3kg, scr of 0.88(08/13/18), lov w/Dr. Jordan(01/15/18) I would grant refill for 3 mos b/c pt has an upcoming appt scheduled already

## 2018-08-29 NOTE — Telephone Encounter (Signed)
Pt is a 26yom requesting xarelto 20mg , pt has a wt of 122.3kg, scr of 0.88(08/13/18), ccr of 166ml/minute lov w/Dr. Jordan(01/15/18) I would grant refill for 3 mos b/c pt has an upcoming appt scheduled already DX Paroxysmal Atrial Fibrillation

## 2018-09-27 NOTE — Progress Notes (Deleted)
Cardiology Office Note    Date:  09/27/2018   ID:  Chad Ellis, DOB 04/21/1960, MRN [id number]  PCP:  Cory Munch, PA-C  Cardiologist:  Dr. Martinique EP: Dr. Rayann Heman  No chief complaint on file.   History of Present Illness:  Chad Ellis is a 58 y.o. male with PMH of HTN, HLD, h/o Dressler's syndrome, PAF on sotolol and Xarelto and CAD s/p DES to RCA 06/2016.  Patient was admitted in April 2018 with atrial fibrillation with RVR and elevated troponin.  Troponin peaked at the 6, [name]  2D echo showed normal LV function and mild LVH, mild to moderate RV dysfunction.  He was initially placed on Brilinta however this was later switched to Plavix due to ongoing atrial fibrillation and the need for Xarelto.  Dr. Rayann Heman saw the patient and also placed him on sotalol.  Later he developed pleuritic chest pain and was treated with colchicine.  He was seen by Almyra Deforest PA-C 2019. Complained of chest pain. Myoview was ordered and was normal. Now has infrequent chest pressure usually relieved with belching but he has taken sl Ntg a couple of times. He had rotator cuff surgery in April. Has cervical spine disease as well. Not working due to this. Complains of low energy. Did have gout bad last winter but this has improved off HCTZ. He is walking 3x/week.   Past Medical History:  Diagnosis Date  . Anxiety   . Coronary artery disease    a. 06/2016: NSTEMI s/p DES to RCA  . Diverticulosis   . Dressler's syndrome ([place])   . Ectopic atrial rhythm   . HLD (hyperlipidemia)   . HTN (hypertension)   . PAF (paroxysmal atrial fibrillation) ([place])    a. 06/2016: Dx'd and started on Sotolol and Xarelto    Past Surgical History:  Procedure Laterality Date  . CHONDROPLASTY Right 05/10/2012   Procedure: CHONDROPLASTY;  Surgeon: Carole Civil, MD;  Location: [facility];  Service: Orthopedics;  Laterality: Right;  of  patella  . COLONOSCOPY    . CORONARY STENT INTERVENTION N/A 07/06/2016   Procedure: Coronary Stent Intervention;  Surgeon: Troy Sine, MD;  Location: [facility];  Service: Cardiovascular;  Laterality: N/A;  . ESOPHAGOGASTRODUODENOSCOPY    . EXAM UNDER ANESTHESIA WITH MANIPULATION OF KNEE Right 05/10/2012   Procedure: EXAM UNDER ANESTHESIA WITH MANIPULATION OF KNEE;  Surgeon: Carole Civil, MD;  Location: [facility];  Service: Orthopedics;  Laterality: Right;  start 0750 end 751  . KNEE ARTHROSCOPY WITH EXCISION PLICA Right 11/04/537   Procedure: KNEE ARTHROSCOPY WITH EXCISION PLICA;  Surgeon: Carole Civil, MD;  Location: [facility];  Service: Orthopedics;  Laterality: Right;  . KNEE ARTHROSCOPY WITH MEDIAL MENISECTOMY Right 05/10/2012   Procedure: KNEE ARTHROSCOPY WITH MEDIAL MENISECTOMY;  Surgeon: Carole Civil, MD;  Location: [facility];  Service: Orthopedics;  Laterality: Right;  . LEFT HEART CATH AND CORONARY ANGIOGRAPHY N/A 07/06/2016   Procedure: Left Heart Cath and Coronary Angiography;  Surgeon: Troy Sine, MD;  Location: [facility];  Service: Cardiovascular;  Laterality: N/A;    Current Medications: Outpatient Medications Prior to Visit  Medication Sig Dispense Refill  . albuterol (PROVENTIL HFA;VENTOLIN HFA) 108 (90 Base) MCG/ACT inhaler Inhale 1-2 puffs into the lungs every 6 (six) hours as needed for wheezing or shortness of breath.    . allopurinol (ZYLOPRIM)  300 MG tablet Take 300 mg by mouth daily.    Marland Kitchen ALPRAZolam (XANAX) 1 MG tablet Take 1 mg by mouth 3 (three) times daily as needed for anxiety or sleep.     Marland Kitchen atorvastatin (LIPITOR) 80 MG tablet TAKE 1 TABLET BY MOUTH DAILY AT 6PM 60 tablet 6  . atorvastatin (LIPITOR) 80 MG tablet TAKE 1 TABLET BY MOUTH DAILY AT 6PM 90 tablet 4  . clopidogrel (PLAVIX) 75 MG tablet Take 1 tablet (75 mg total) by mouth daily. 60 tablet 0  . colchicine 0.6 MG tablet Take 0.6 mg by mouth daily.    . cyclobenzaprine  (FLEXERIL) 10 MG tablet Take 10 mg by mouth daily.    . diphenhydrAMINE HCl (BENADRYL ALLERGY PO) Take by mouth.    Marland Kitchen lisinopril (PRINIVIL,ZESTRIL) 10 MG tablet TAKE 1 TABLET BY MOUTH EVERY DAY 90 tablet 2  . metoprolol succinate (TOPROL-XL) 100 MG 24 hr tablet TAKE ONE TABLET BY MOUTH ONCE DAILY IN THE EVENING TAKE WITH OR IMMEDIATELY FOLLOWING A MEAL 90 tablet 0  . nitroGLYCERIN (NITROSTAT) 0.4 MG SL tablet Place 1 tablet (0.4 mg total) under the tongue every 5 (five) minutes as needed for chest pain. (Patient not taking: Reported on 01/15/2018) 25 tablet 12  . predniSONE (STERAPRED UNI-PAK 21 TAB) 10 MG (21) TBPK tablet   0  . sotalol (BETAPACE) 120 MG tablet Take 1 tablet (120 mg total) by mouth 2 (two) times daily. 180 tablet 3  . tamsulosin (FLOMAX) 0.4 MG CAPS capsule TAKE 1 CAPSULE BY MOUTH EVERY DAY 90 capsule 0  . XARELTO 20 MG TABS tablet TAKE 1 TABLET (20 MG TOTAL) BY MOUTH DAILY WITH SUPPER. 30 tablet 0   No facility-administered medications prior to visit.      Allergies:   Patient has no known allergies.   Social History   Socioeconomic History  . Marital status: Divorced    Spouse name: Not on file  . Number of children: Not on file  . Years of education: Not on file  . Highest education level: Not on file  Occupational History  . Not on file  Social Needs  . Financial resource strain: Not on file  . Food insecurity    Worry: Not on file    Inability: Not on file  . Transportation needs    Medical: Not on file    Non-medical: Not on file  Tobacco Use  . Smoking status: Never Smoker  . Smokeless tobacco: Never Used  Substance and Sexual Activity  . Alcohol use: Yes    Comment: almost daily use of brandy or beer  . Drug use: No  . Sexual activity: Not on file  Lifestyle  . Physical activity    Days per week: Not on file    Minutes per session: Not on file  . Stress: Not on file  Relationships  . Social Herbalist on phone: Not on file    Gets  together: Not on file    Attends religious service: Not on file    Active member of club or organization: Not on file    Attends meetings of clubs or organizations: Not on file    Relationship status: Not on file  Other Topics Concern  . Not on file  Social History Narrative  . Not on file     Family History:  The patient's family history includes Atrial fibrillation in his mother; COPD in his father; Cholecystitis in his sister; Diabetes in  his father; Mesothelioma in his father.   ROS:   Please see the history of present illness.    ROS All other systems reviewed and are negative.   PHYSICAL EXAM:   VS:  There were no vitals taken for this visit.   GENERAL:  Well appearing, overweight WM in NAD HEENT:  PERRL, EOMI, sclera are clear. Oropharynx is clear. NECK:  No jugular venous distention, carotid upstroke brisk and symmetric, no bruits, no thyromegaly or adenopathy LUNGS:  Clear to auscultation bilaterally CHEST:  Unremarkable HEART:  RRR,  PMI not displaced or sustained,S1 and S2 within normal limits, no S3, no S4: no clicks, no rubs, no murmurs ABD:  Soft, nontender. BS +, no masses or bruits. No hepatomegaly, no splenomegaly EXT:  2 + pulses throughout, no edema, no cyanosis no clubbing SKIN:  Warm and dry.  No rashes NEURO:  Alert and oriented x 3. Cranial nerves II through XII intact. PSYCH:  Cognitively intact    Wt Readings from Last 3 Encounters:  01/15/18 269 lb 9.6 oz (122.3 kg)  05/25/17 270 lb (122.5 kg)  04/26/17 270 lb (122.5 kg)      Studies/Labs Reviewed:   EKG:  EKG is not ordered today.   Recent Labs: 08/13/2018: BUN 15; Creatinine, Ser 0.88; Hemoglobin 15.0; Platelets 276; Potassium 4.8; Sodium 140   Lipid Panel    Component Value Date/Time   CHOL 130 12/07/2016 1057   TRIG 194 (H) 12/07/2016 1057   HDL 34 (L) 12/07/2016 1057   CHOLHDL 8.3 07/06/2016 0307   VLDL 63 (H) 07/06/2016 0307   LDLCALC 57 12/07/2016 1057    Additional studies/  records that were reviewed today include:   Cath 07/06/2016 Conclusion     1st Mrg lesion, 20 %stenosed.  Mid LAD lesion, 20 %stenosed.  A STENT SYNERGY DES [id number] drug eluting stent was successfully placed.  Prox RCA to Mid RCA lesion, 100 %stenosed.  Post intervention, there is a 0% residual stenosis.  The left ventricular ejection fraction is 45-50% by visual estimate.  There is mild left ventricular systolic dysfunction.   Acute/subacute very proximal RCA occlusion with extensive thrombus burden and TIMI 0 flow.  Mild nonobstructive concomitant CAD with 20% LAD stenosis and 20% circumflex narrowing and a large left circumflex vessel.  There was a hint of very minimal if any distal collateralization of the PLA vessel from the RCA via the left injection.  Difficult but successful PCI to the totally occluded RCA with extensive thrombus requiring thrombectomy, PTCA, and ultimate DES stenting with a 3.538 mm Synergy DES stent postdilated to 4.14 mm very proximally and 3.9-4.0 mm distally in a very large RCA that supplies the PDA and PLA vessel.  RECOMMENDATION: Dual antiplatelet therapy.  The patient will continue on Aggrastat for 18 hours post procedure.  The patient will be started on high potency statin therapy, and medical therapy post MI including ACE-I/ARB, and beta blockerRx.      Echo 07/08/2016 LV EF: 55% -   60% Study Conclusions  - Left ventricle: The cavity size was normal. Wall thickness was   increased in a pattern of mild LVH. Systolic function was normal.   The estimated ejection fraction was in the range of 55% to 60%.   Basal inferior akinesis. Doppler parameters are consistent with   abnormal left ventricular relaxation (grade 1 diastolic   dysfunction). - Aortic valve: There was no stenosis. - Aorta: Mildly dilated aortic root. Aortic root dimension: 38 mm   (  ED). - Mitral valve: There was trivial regurgitation. - Left atrium: The atrium was  mildly dilated. - Right ventricle: Mildly dilated RV with mild to moderately   decreased systolic function. The free wall is hypokinetic   compared to the apex. No PE on CTA, would be concerned for RV   infarction. - Right atrium: The atrium was mildly dilated. - Pulmonary arteries: No complete TR doppler jet so unable to   estimate PA systolic pressure. - Systemic veins: IVC measured 2.4 cm with > 50% respirophasic   variation, suggesting RA pressure 8 mmHg.  Impressions:  - Normal LV size with mild LV hypertrophy. EF 55-60%, basal   inferior akinesis. The RV was mildly dilated with mild to   moderate systolic dysfunction. Free wall was hypokinetic compared   to apex, in absence of PE would consider RV infarction. No   significant valvular abnormalities.  Myoview 05/25/17: Study Highlights     The left ventricular ejection fraction is normal (55-65%).  Nuclear stress EF: 64%.  There was no ST segment deviation noted during stress.  The study is normal.   1. EF 64%, normal wall motion.  2. No evidence for ischemia or infarction on perfusion images.   Normal study.      ASSESSMENT:    No diagnosis found.   PLAN:  In order of problems listed above:   1. CAD: On Plavix given the need for NOAC. No ASA. Normal Myoview in March 2019. Infrequent atypical chest pain.  2. PAF: On sotalol and metoprolol. Good control. In NSR on exam Continue Xarelto 20 mg daily.  3. Hypertension: Blood pressure stable.  4. Hyperlipidemia: On Lipitor 80 mg daily.  Will request a copy of last lab work from primary care.   Follow up in 6 months.     Medication Adjustments/Labs and Tests Ordered: Current medicines are reviewed at length with the patient today.  Concerns regarding medicines are outlined above.  Medication changes, Labs and Tests ordered today are listed in the Patient Instructions below. There are no Patient Instructions on file for this visit.   Signed, Dalal Livengood  Martinique, MD  09/27/2018 12:59 PM    [facility] [place], [place], [place]  [address] Phone: [phone]; Fax: [phone]

## 2018-10-03 ENCOUNTER — Ambulatory Visit: Payer: PRIVATE HEALTH INSURANCE | Admitting: Cardiology

## 2018-10-03 ENCOUNTER — Telehealth: Payer: Self-pay | Admitting: Cardiology

## 2018-10-03 NOTE — Progress Notes (Signed)
Cardiology Office Note:    Date:  10/09/2018   ID:  Chad Ellis, DOB Jul 02, 1960, MRN [id number]  PCP:  Cory Munch, PA-C  Cardiologist:  Peter Martinique, MD   Referring MD: Cory Munch, PA-C   Chief Complaint  Patient presents with  . Follow-up    CAD, PAF    History of Present Illness:    Chad Ellis is a 58 y.o. male with a hx of hypertension, hyperlipidemia, hx of dressler's syndrome, PAF on sotalol and anticoagulated with xarelto, CAD s/p DES to RCA 06/2016. He was admitted 06/2016 found to have Afib RVR and elevated troponin. Heart cath revealed 100% stenosis in the proximal RCA treated with thrombectomy and DES. Echocardiogram with preserved EF of 55-50% and grade 1 DD. Brilinta was switched to plavix in the setting of ongoing need for anticoagulation with xarelto. Dr. Rayann Heman placed him on sotalol. He later developed pleuritic chest pain and was treated with colchicine. Due to ongoing use of SL nitro, he underwent myoview stress test 05/25/17, which was negative for reversible ischemia. He last saw Dr. Martinique 01/15/18. At that time, he had stopped taking HCTZ and his gout improved.  HCTZ was previously prescribed by his primary care.  He presents today for routine follow-up. The heat has been troublesome for him. He ad an episode of chest pressure after mowing the lawn on a riding mower two weeks ago. He used the cold water hose to rinse off his legs and arms and rested outside for 30 min after mowing, and then showered. After his shower, he developed chest pressure and he took a nitro SL. The chest pressure subsided after about an hour of rest.  This episode of chest pressure was associated with SOB, diaphoresis, and nausea, but no vomiting. He states that this was a severe episode and almost caused him to lie down on the bathroom floor. He states this may have been an anxiety attack - he felt like something big was about to happen. He denies palpitations.    Past Medical  History:  Diagnosis Date  . Anxiety   . Coronary artery disease    a. 06/2016: NSTEMI s/p DES to RCA  . Diverticulosis   . 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 Ectopic atrial rhythm   . HLD (hyperlipidemia)   . HTN (hypertension)   . PAF (paroxysmal atrial fibrillation) ([place])    a. 06/2016: Dx'd and started on Sotolol and Xarelto    Past Surgical History:  Procedure Laterality Date  . CHONDROPLASTY Right 05/10/2012   Procedure: CHONDROPLASTY;  Surgeon: Carole Civil, MD;  Location: [facility];  Service: Orthopedics;  Laterality: Right;  of patella  . COLONOSCOPY    . CORONARY STENT INTERVENTION N/A 07/06/2016   Procedure: Coronary Stent Intervention;  Surgeon: Troy Sine, MD;  Location: [facility];  Service: Cardiovascular;  Laterality: N/A;  . ESOPHAGOGASTRODUODENOSCOPY    . EXAM UNDER ANESTHESIA WITH MANIPULATION OF KNEE Right 05/10/2012   Procedure: EXAM UNDER ANESTHESIA WITH MANIPULATION OF KNEE;  Surgeon: Carole Civil, MD;  Location: [facility];  Service: Orthopedics;  Laterality: Right;  start 0750 end 751  . KNEE ARTHROSCOPY WITH EXCISION PLICA Right 09/13/5007   Procedure: KNEE ARTHROSCOPY WITH EXCISION PLICA;  Surgeon: Carole Civil, MD;  Location: [facility];  Service: Orthopedics;  Laterality: Right;  . KNEE ARTHROSCOPY WITH MEDIAL MENISECTOMY Right 05/10/2012   Procedure: KNEE ARTHROSCOPY WITH MEDIAL MENISECTOMY;  Surgeon: Carole Civil, MD;  Location: [facility];  Service: Orthopedics;  Laterality: Right;  . LEFT HEART CATH AND CORONARY ANGIOGRAPHY N/A 07/06/2016   Procedure: Left Heart Cath and Coronary Angiography;  Surgeon: Troy Sine, MD;  Location: [facility];  Service: Cardiovascular;  Laterality: N/A;    Current Medications: Current Meds  Medication Sig  . albuterol (PROVENTIL HFA;VENTOLIN HFA) 108 (90 Base) MCG/ACT inhaler Inhale 1-2 puffs into the lungs every 6 (six) hours as needed for wheezing or shortness of breath.  . allopurinol (ZYLOPRIM)  300 MG tablet Take 300 mg by mouth as needed (gout).   . ALPRAZolam (XANAX) 1 MG tablet Take 1 mg by mouth 3 (three) times daily as needed for anxiety or sleep.   Marland Kitchen atorvastatin (LIPITOR) 80 MG tablet TAKE 1 TABLET BY MOUTH DAILY AT 6PM  . clopidogrel (PLAVIX) 75 MG tablet Take 1 tablet (75 mg total) by mouth daily.  . colchicine 0.6 MG tablet Take 0.6 mg by mouth as needed.   . cyclobenzaprine (FLEXERIL) 10 MG tablet Take 10 mg by mouth daily.  . diphenhydrAMINE HCl (BENADRYL ALLERGY PO) Take by mouth.  Marland Kitchen lisinopril (PRINIVIL,ZESTRIL) 10 MG tablet TAKE 1 TABLET BY MOUTH EVERY DAY  . metoprolol succinate (TOPROL-XL) 100 MG 24 hr tablet TAKE ONE TABLET BY MOUTH ONCE DAILY IN THE EVENING TAKE WITH OR IMMEDIATELY FOLLOWING A MEAL  . nitroGLYCERIN (NITROSTAT) 0.4 MG SL tablet Place 1 tablet (0.4 mg total) under the tongue every 5 (five) minutes as needed for chest pain.  . predniSONE (STERAPRED UNI-PAK 21 TAB) 10 MG (21) TBPK tablet as needed (gout).   . sotalol (BETAPACE) 120 MG tablet Take 1 tablet (120 mg total) by mouth 2 (two) times daily.  . tamsulosin (FLOMAX) 0.4 MG CAPS capsule TAKE 1 CAPSULE BY MOUTH EVERY DAY  . XARELTO 20 MG TABS tablet TAKE 1 TABLET (20 MG TOTAL) BY MOUTH DAILY WITH SUPPER.     Allergies:   Patient has no known allergies.   Social History   Socioeconomic History  . Marital status: Divorced    Spouse name: Not on file  . Number of children: Not on file  . Years of education: Not on file  . Highest education level: Not on file  Occupational History  . Not on file  Social Needs  . Financial resource strain: Not on file  . Food insecurity    Worry: Not on file    Inability: Not on file  . Transportation needs    Medical: Not on file    Non-medical: Not on file  Tobacco Use  . Smoking status: Never Smoker  . Smokeless tobacco: Never Used  Substance and Sexual Activity  . Alcohol use: Yes    Comment: almost daily use of brandy or beer  . Drug use: No  .  Sexual activity: Not on file  Lifestyle  . Physical activity    Days per week: Not on file    Minutes per session: Not on file  . Stress: Not on file  Relationships  . Social Herbalist on phone: Not on file    Gets together: Not on file    Attends religious service: Not on file    Active member of club or organization: Not on file    Attends meetings of clubs or organizations: Not on file    Relationship status: Not on file  Other Topics Concern  . Not on file  Social History Narrative  . Not on  file     Family History: The patient's family history includes Atrial fibrillation in his mother; COPD in his father; Cholecystitis in his sister; Diabetes in his father; Mesothelioma in his father.  ROS:   Please see the history of present illness.     All other systems reviewed and are negative.  EKGs/Labs/Other Studies Reviewed:    The following studies were reviewed today:  Left heart cath 07/06/16:  1st Mrg lesion, 20 %stenosed.  Mid LAD lesion, 20 %stenosed.  A STENT SYNERGY DES 3.5X38 drug eluting stent was successfully placed.  Prox RCA to Mid RCA lesion, 100 %stenosed.  Post intervention, there is a 0% residual stenosis.  The left ventricular ejection fraction is 45-50% by visual estimate.  There is mild left ventricular systolic dysfunction.   Acute/subacute very proximal RCA occlusion with extensive thrombus burden and TIMI 0 flow.  Mild nonobstructive concomitant CAD with 20% LAD stenosis and 20% circumflex narrowing and a large left circumflex vessel.  There was a hint of very minimal if any distal collateralization of the PLA vessel from the RCA via the left injection.  Difficult but successful PCI to the totally occluded RCA with extensive thrombus requiring thrombectomy, PTCA, and ultimate DES stenting with a 3.538 mm Synergy DES stent postdilated to 4.14 mm very proximally and 3.9-4.0 mm distally in a very large RCA that supplies the PDA and PLA  vessel.  RECOMMENDATION: Dual antiplatelet therapy.  The patient will continue on Aggrastat for 18 hours post procedure.  The patient will be started on high potency statin therapy, and medical therapy post MI including ACE-I/ARB, and beta blockerRx.   Echo 07/08/16 Study Conclusions - Left ventricle: The cavity size was normal. Wall thickness was   increased in a pattern of mild LVH. Systolic function was normal.   The estimated ejection fraction was in the range of 55% to 60%.   Basal inferior akinesis. Doppler parameters are consistent with   abnormal left ventricular relaxation (grade 1 diastolic   dysfunction). - Aortic valve: There was no stenosis. - Aorta: Mildly dilated aortic root. Aortic root dimension: 38 mm   (ED). - Mitral valve: There was trivial regurgitation. - Left atrium: The atrium was mildly dilated. - Right ventricle: Mildly dilated RV with mild to moderately   decreased systolic function. The free wall is hypokinetic   compared to the apex. No PE on CTA, would be concerned for RV   infarction. - Right atrium: The atrium was mildly dilated. - Pulmonary arteries: No complete TR doppler jet so unable to   estimate PA systolic pressure. - Systemic veins: IVC measured 2.4 cm with > 50% respirophasic   variation, suggesting RA pressure 8 mmHg.  Impressions: - Normal LV size with mild LV hypertrophy. EF 55-60%, basal   inferior akinesis. The RV was mildly dilated with mild to   moderate systolic dysfunction. Free wall was hypokinetic compared   to apex, in absence of PE would consider RV infarction. No   significant valvular abnormalities.   EKG:  EKG is ordered today.  The ekg ordered today demonstrates sinus rhythm with HR 68  Recent Labs: 08/13/2018: BUN 15; Creatinine, Ser 0.88; Hemoglobin 15.0; Platelets 276; Potassium 4.8; Sodium 140  Recent Lipid Panel    Component Value Date/Time   CHOL 130 12/07/2016 1057   TRIG 194 (H) 12/07/2016 1057   HDL 34  (L) 12/07/2016 1057   CHOLHDL 8.3 07/06/2016 0307   VLDL 63 (H) 07/06/2016 0307   LDLCALC 57  12/07/2016 1057    Physical Exam:    VS:  BP 130/84   Pulse 68   Ht 6\' 1"  (1.854 m)   Wt 270 lb (122.5 kg)   BMI 35.62 kg/m     Wt Readings from Last 3 Encounters:  10/09/18 270 lb (122.5 kg)  01/15/18 269 lb 9.6 oz (122.3 kg)  05/25/17 270 lb (122.5 kg)     GEN: Well nourished, well developed in no acute distress HEENT: Normal NECK: No JVD; No carotid bruits LYMPHATICS: No lymphadenopathy CARDIAC: RRR, no murmurs, rubs, gallops RESPIRATORY:  Clear to auscultation without rales, wheezing or rhonchi  ABDOMEN: Soft, non-tender, non-distended MUSCULOSKELETAL:  No edema; No deformity  SKIN: Warm and dry NEUROLOGIC:  Alert and oriented x 3 PSYCHIATRIC:  Normal affect   ASSESSMENT:    1. PAF (paroxysmal atrial fibrillation) ([place])   2. Non-ST elevation (NSTEMI) myocardial infarction ([place])   3. Essential hypertension   4. Coronary artery disease of native artery of native heart with stable angina pectoris ([place])   5. Hyperlipidemia, unspecified hyperlipidemia type    PLAN:    In order of problems listed above:  PAF Anticoagulated with Xarelto, on sotalol. Has been stable. In NSR today. No palpitations. No bleeding problems.  This patients CHA2DS2-VASc Score and unadjusted Ischemic Stroke Rate (% per year) is equal to 2.2 % stroke rate/year from a score of 2 (MI, HTN)   CAD, NSTEMI status post DES to RCA (06/2016) He continues on Plavix. His recent episode of chest pain is described with typical and atypical features. Nitro did not immediately relieve his pain and his chest pain was not exertional, although was associated with SOB, diaphoresis, and nausea. He questions possible anxiety attack. He has not had a recurrence. We reviewed his cath results and his stress test from last year. We discussed exertional angina and other causes of chest pain. He does not wish to repeat a stress  test at this time due to cost. He would like to take a conservative approach. If he has more episodes of chest pressure, he will call and I will order a lexiscan myoview. We discussed that he is deconditioned and eats salt. He will try to become more active, which is difficult during the current pandemic. I will start a very low dose of norvasc to see if this helps with his chest pressure.    Grade 1 diastolic dysfunction We discussed decreasing his salt intake. Euvolemic on exam   Hypertension Well-controlled. Add 2.5 mg norvasc as above.    Hyperlipidemia  He is concerned that his cholesterol hasn't been checked and wishes to do this today. He isn't fasting, but has only had coffee with coffeemate creamer. Continue lipitor.    If chest pain recurs, he will undergo myoview stress test. Otherwise, follow up in 1 year with Dr. Martinique.   Medication Adjustments/Labs and Tests Ordered: Current medicines are reviewed at length with the patient today.  Concerns regarding medicines are outlined above.  Orders Placed This Encounter  Procedures  . Lipid panel  . EKG 12-Lead   Meds ordered this encounter  Medications  . amLODipine (NORVASC) 2.5 MG tablet    Sig: Take 1 tablet (2.5 mg total) by mouth daily.    Dispense:  180 tablet    Refill:  3    Signed, Ledora Bottcher, [place]  10/09/2018 10:07 AM    [place]

## 2018-10-03 NOTE — Telephone Encounter (Signed)
LVM for pt to call back to be pre-screened for COVID-19.

## 2018-10-08 ENCOUNTER — Telehealth: Payer: Self-pay | Admitting: Physician Assistant

## 2018-10-08 ENCOUNTER — Other Ambulatory Visit: Payer: Self-pay | Admitting: Physician Assistant

## 2018-10-08 NOTE — Telephone Encounter (Signed)

## 2018-10-09 ENCOUNTER — Ambulatory Visit (INDEPENDENT_AMBULATORY_CARE_PROVIDER_SITE_OTHER): Payer: PRIVATE HEALTH INSURANCE | Admitting: Physician Assistant

## 2018-10-09 ENCOUNTER — Other Ambulatory Visit: Payer: Self-pay

## 2018-10-09 ENCOUNTER — Encounter: Payer: Self-pay | Admitting: Physician Assistant

## 2018-10-09 VITALS — BP 130/84 | HR 68 | Ht 73.0 in | Wt 270.0 lb

## 2018-10-09 DIAGNOSIS — I25118 Atherosclerotic heart disease of native coronary artery with other forms of angina pectoris: Secondary | ICD-10-CM

## 2018-10-09 DIAGNOSIS — I10 Essential (primary) hypertension: Secondary | ICD-10-CM | POA: Diagnosis not present

## 2018-10-09 DIAGNOSIS — I214 Non-ST elevation (NSTEMI) myocardial infarction: Secondary | ICD-10-CM | POA: Diagnosis not present

## 2018-10-09 DIAGNOSIS — I480 Paroxysmal atrial fibrillation: Secondary | ICD-10-CM

## 2018-10-09 DIAGNOSIS — E785 Hyperlipidemia, unspecified: Secondary | ICD-10-CM

## 2018-10-09 MED ORDER — AMLODIPINE BESYLATE 2.5 MG PO TABS: 2.5000 mg | ORAL_TABLET | Freq: Every day | ORAL | 3 refills | Status: DC

## 2018-10-09 NOTE — Patient Instructions (Signed)
Medication Instructions:  START Amlodipine 2.5 mg daily.  If you need a refill on your cardiac medications before your next appointment, please call your pharmacy.   Lab work: Your physician recommends that you return for a FASTING lipid profile: today  If you have labs (blood work) drawn today and your tests are completely normal, you will receive your results only by: Marland Kitchen MyChart Message (if you have MyChart) OR . A paper copy in the mail If you have any lab test that is abnormal or we need to change your treatment, we will call you to review the results.   Follow-Up: At [facility], you and your health needs are our priority.  As part of our continuing mission to provide you with exceptional heart care, we have created designated Provider Care Teams.  These Care Teams include your primary Cardiologist (physician) and Advanced Practice Providers (APPs -  Physician Assistants and Nurse Practitioners) who all work together to provide you with the care you need, when you need it. You will need a follow up appointment in 12 months.  Please call our office 2 months in advance to schedule this appointment.  You may see Peter Martinique, MD or one of the following Advanced Practice Providers on your designated Care Team: [place], [place] . Fabian Sharp, PA-C  Any Other Special Instructions Will Be Listed Below (If Applicable). None

## 2018-10-10 LAB — LIPID PANEL
Chol/HDL Ratio: 4.8 ratio (ref 0.0–5.0)
Cholesterol, Total: 135 mg/dL (ref 100–199)
HDL: 28 mg/dL — ABNORMAL LOW (ref >39)
LDL Calculated: 64 mg/dL (ref 0–99)
Triglycerides: 217 mg/dL — ABNORMAL HIGH (ref 0–149)
VLDL Cholesterol Cal: 43 mg/dL — ABNORMAL HIGH (ref 5–40)

## 2018-10-11 ENCOUNTER — Other Ambulatory Visit: Payer: Self-pay | Admitting: Physician Assistant

## 2018-10-11 DIAGNOSIS — I25118 Atherosclerotic heart disease of native coronary artery with other forms of angina pectoris: Secondary | ICD-10-CM

## 2018-10-11 MED ORDER — FENOFIBRATE 48 MG PO TABS: 48.0000 mg | ORAL_TABLET | Freq: Every day | ORAL | 3 refills | Status: DC

## 2018-11-20 ENCOUNTER — Other Ambulatory Visit: Payer: Self-pay | Admitting: Cardiology

## 2018-12-02 ENCOUNTER — Other Ambulatory Visit: Payer: Self-pay

## 2018-12-02 MED ORDER — SOTALOL HCL 120 MG PO TABS: 120.0000 mg | ORAL_TABLET | Freq: Two times a day (BID) | ORAL | 3 refills | Status: AC

## 2018-12-03 ENCOUNTER — Other Ambulatory Visit: Payer: Self-pay | Admitting: Cardiology

## 2018-12-03 NOTE — Telephone Encounter (Signed)
23m Scr 0.88 08/13/18 122.5kg ccr 136.4 Lovw/duke 10/09/18

## 2018-12-19 ENCOUNTER — Telehealth: Payer: Self-pay | Admitting: Cardiology

## 2018-12-19 NOTE — Telephone Encounter (Signed)
Called patient- he states he needs RX sent for the AMLODIPINE and PLAVIX sent to [facility]-  I believe he gets assistance per previous messages, primary nurse is aware.  He states when he called that the lady told him she would not fill the PLAVIX without why patient was on both PLAVIX and XARELTO.   I advised I would send a message to primary to have this sent over to them- and to fill both, as per last office note.   Patient thankful for call.

## 2018-12-19 NOTE — Telephone Encounter (Signed)
New message   Patient needs a prescription sent to [facility] for amLODipine (NORVASC) 2.5 MG tablet. Please call as well to discuss patient's other medications.

## 2018-12-20 NOTE — Telephone Encounter (Signed)
Spoke to patient he stated he needs refills for amlodipine,plavix and xarelto sent to [facility] in [place].He stated they are helping him with his medications. Spoke to pharmacist at [facility] # [phone]. 90 day refills called in for amlodipine,plavix and xarelto.Unable to send to them electronically.

## 2019-01-15 ENCOUNTER — Other Ambulatory Visit: Payer: Self-pay | Admitting: Cardiology

## 2019-01-20 ENCOUNTER — Other Ambulatory Visit: Payer: Self-pay | Admitting: Physician Assistant

## 2019-02-03 ENCOUNTER — Other Ambulatory Visit: Payer: Self-pay | Admitting: Cardiology

## 2019-02-03 NOTE — Telephone Encounter (Signed)
°*  STAT* If patient is at the pharmacy, call can be transferred to refill team.   1. Which medications need to be refilled? (please list name of each medication and dose if known) metoprolol succinate (TOPROL-XL) 100 MG 24 hr tablet  2. Which pharmacy/location (including street and city if local pharmacy) is medication to be sent to? [facility] [phone]  3. Do they need a 30 day or 90 day supply? 90    Patient only has 2 pills left and would like a 2 week supply [id number] [place], [place] until [facility] sends medication.

## 2019-02-04 MED ORDER — METOPROLOL SUCCINATE ER 100 MG PO TB24: ORAL_TABLET | ORAL | 0 refills | Status: AC

## 2019-06-03 IMAGING — NM NM MISC PROCEDURE
9 series · 54 of 54 positions shown · non-contrast
Comparison: none

[Series 1: wbr_r-proj_st wbr rest · 6.40mm/px · 6 of 64 frames shown]
[frame 6/64]
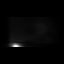
[frame 16/64]
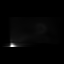
[frame 27/64]
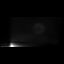
[frame 38/64]
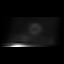
[frame 48/64]
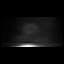
[frame 59/64]
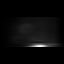

[Series 1: wbr rest · 6.40mm/px · 6 of 64 frames shown]
[frame 6/64]
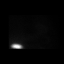
[frame 16/64]
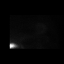
[frame 27/64]
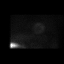
[frame 38/64]
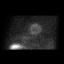
[frame 48/64]
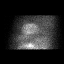
[frame 59/64]
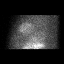

[Series 1: rest sax · 6.4mm · 6.40mm/px · 6 of 23 frames shown]
[frame 2/23]
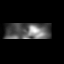
[frame 6/23]
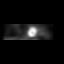
[frame 10/23]
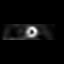
[frame 14/23]
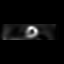
[frame 18/23]
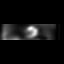
[frame 22/23]
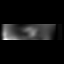

[Series 2: wbr stress-gsp · 6.40mm/px · 6 of 512 frames shown]
[frame 43/512]
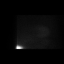
[frame 128/512]
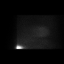
[frame 214/512]
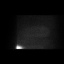
[frame 299/512]
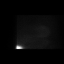
[frame 384/512]
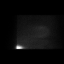
[frame 470/512]
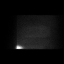

[Series 2: wbr_s-proj_st wbr stress-gsp · 6.40mm/px · 6 of 512 frames shown]
[frame 43/512]
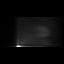
[frame 128/512]
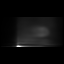
[frame 214/512]
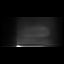
[frame 299/512]
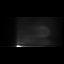
[frame 384/512]
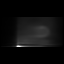
[frame 470/512]
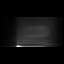

[Series 2: stress sax gs · 6.4mm · 6.40mm/px · 6 of 200 frames shown]
[frame 17/200]
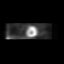
[frame 50/200]
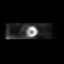
[frame 84/200]
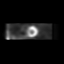
[frame 117/200]
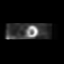
[frame 150/200]
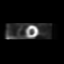
[frame 184/200]
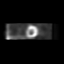

[Series 3: wbr_s-proj_st wbr stress-sum-em · 6.40mm/px · 6 of 64 frames shown]
[frame 6/64]
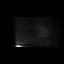
[frame 16/64]
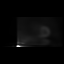
[frame 27/64]
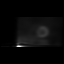
[frame 38/64]
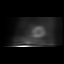
[frame 48/64]
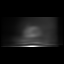
[frame 59/64]
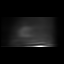

[Series 3: wbr stress-sum-em · 6.40mm/px · 6 of 64 frames shown]
[frame 6/64]
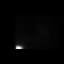
[frame 16/64]
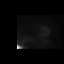
[frame 27/64]
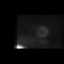
[frame 38/64]
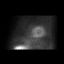
[frame 48/64]
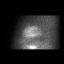
[frame 59/64]
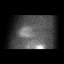

[Series 3: stress sax · 6.4mm · 6.40mm/px · 6 of 25 frames shown]
[frame 3/25]
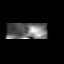
[frame 7/25]
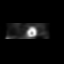
[frame 11/25]
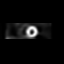
[frame 15/25]
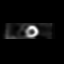
[frame 19/25]
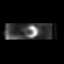
[frame 23/25]
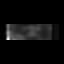

[54 of 54 positions shown; findings below may reference images not displayed]

Canned report from images found in remote index.

Refer to host system for actual result text.

## 2019-07-14 ENCOUNTER — Other Ambulatory Visit: Payer: Self-pay | Admitting: Cardiology

## 2019-07-17 ENCOUNTER — Other Ambulatory Visit: Payer: Self-pay | Admitting: Cardiology

## 2019-08-04 ENCOUNTER — Other Ambulatory Visit: Payer: Self-pay | Admitting: Cardiology

## 2019-09-30 ENCOUNTER — Other Ambulatory Visit: Payer: Self-pay | Admitting: Cardiology

## 2019-10-31 ENCOUNTER — Other Ambulatory Visit: Payer: Self-pay

## 2019-10-31 MED ORDER — AMLODIPINE BESYLATE 2.5 MG PO TABS: 2.5000 mg | ORAL_TABLET | Freq: Every day | ORAL | 1 refills | Status: DC

## 2019-11-24 ENCOUNTER — Other Ambulatory Visit: Payer: Self-pay

## 2019-11-24 MED ORDER — AMLODIPINE BESYLATE 2.5 MG PO TABS: 2.5000 mg | ORAL_TABLET | Freq: Every day | ORAL | 1 refills | Status: DC

## 2019-12-12 ENCOUNTER — Other Ambulatory Visit: Payer: Self-pay

## 2019-12-12 MED ORDER — AMLODIPINE BESYLATE 2.5 MG PO TABS: 2.5000 mg | ORAL_TABLET | Freq: Every day | ORAL | 1 refills | Status: DC

## 2020-07-14 ENCOUNTER — Telehealth: Payer: Self-pay | Admitting: Cardiology

## 2020-07-14 NOTE — Telephone Encounter (Signed)
*  STAT* If patient is at the pharmacy, call can be transferred to refill team.   1. Which medications need to be refilled? (please list name of each medication and dose if known)  Xarelto, Amlodipine Atorvastatin, Clopidogrel, Lisinoprill, Metoprolol. Ans Sotalo  Which pharmacy/location (including street and city if local pharmacy) is medication to be sent to? [facility] [place]  3. Do they need a 30 day or 90 day supply? 90 da  90 days and refills

## 2020-07-15 NOTE — Telephone Encounter (Signed)
Rx(s) sent to pharmacy electronically.  

## 2020-08-11 NOTE — Progress Notes (Signed)
Cardiology Office Note:    Date:  08/25/2020   ID:  Chad Ellis, DOB May 23, 1960, MRN [id number]  PCP:  Cory Munch, PA-C  Cardiologist:  Peter Martinique, MD   Referring MD: Cory Munch, PA-C   Chief Complaint  Patient presents with   Follow-up    CAD, PAF     History of Present Illness:    Chad Ellis is a 60 y.o. male with a hx of hypertension, hyperlipidemia, hx of dressler's syndrome, PAF on sotalol and anticoagulated with xarelto, CAD s/p DES to RCA 06/2016. He was admitted  06/2016 found to have Afib RVR and elevated troponin. Heart cath revealed 100% stenosis in the proximal RCA treated with thrombectomy and DES. Echocardiogram with preserved EF of 55-50% and grade 1 DD. Brilinta was switched to plavix in the setting of ongoing need for anticoagulation with xarelto. Dr. Rayann Heman placed him on sotalol. He later developed pleuritic chest pain and was treated with colchicine. Due to ongoing use of SL nitro, he underwent myoview stress test which was negative for reversible ischemia. He last saw Dr. Martinique 01/15/18. At that time, he had stopped taking HCTZ and his gout had improved.  HCTZ was prescribed by his primary care. I saw him in 09/2018 and he described an episode of chest pain. We reviewed his prior stress test and cath. He did not wish to pursue another stress test and wanted conservative management. I started low dose amlodipine and advised low sodium diet and exercise.   He presents today for routine follow-up. He is here alone. His father passed away last 27-Mar-2023 and he is dealing with [employer] burial and settling his estate. His girlfriend had a "liver scare" and they are both now on the "[employer]." He has lost 15 lbs in 1 month. BP is controlled. No chest pain, but he has been less active in the summer heat. He ride mows, infrequently weed eats. He does moderate house work. He wants to walk with a friend but friend walks at 5am. No chest pain.   He complains  of ongoing diarrhea (prior to MI). He has diverticulitis and is due for colonoscopy - he will call GI.    Past Medical History:  Diagnosis Date   Anxiety    Coronary artery disease    a. 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 06/2016: Dx'd and started on Sotolol and Xarelto    Past Surgical History:  Procedure Laterality Date   CHONDROPLASTY Right 05/10/2012   Procedure: CHONDROPLASTY;  Surgeon: Carole Civil, MD;  Location: [facility];  Service: Orthopedics;  Laterality: Right;  of patella   COLONOSCOPY     CORONARY STENT INTERVENTION N/A 07/06/2016   Procedure: Coronary Stent Intervention;  Surgeon: Troy Sine, MD;  Location: [place] [facility];  Service: Cardiovascular;  Laterality: N/A;   ESOPHAGOGASTRODUODENOSCOPY     EXAM UNDER ANESTHESIA WITH MANIPULATION OF KNEE Right 05/10/2012   Procedure: EXAM UNDER ANESTHESIA WITH MANIPULATION OF KNEE;  Surgeon: Carole Civil, MD;  Location: [facility];  Service: Orthopedics;  Laterality: Right;  start 0750 end 751   KNEE ARTHROSCOPY WITH EXCISION PLICA Right 06/09/5186   Procedure: KNEE ARTHROSCOPY WITH EXCISION PLICA;  Surgeon: Carole Civil, MD;  Location: [facility];  Service:  Orthopedics;  Laterality: Right;   KNEE ARTHROSCOPY WITH MEDIAL MENISECTOMY Right 05/10/2012   Procedure: KNEE ARTHROSCOPY WITH MEDIAL MENISECTOMY;  Surgeon: Carole Civil, MD;  Location: [facility];  Service: Orthopedics;  Laterality: Right;   LEFT HEART CATH AND CORONARY ANGIOGRAPHY N/A 07/06/2016   Procedure: Left Heart Cath and Coronary Angiography;  Surgeon: Troy Sine, MD;  Location: [place] [facility];  Service: Cardiovascular;  Laterality: N/A;    Current Medications: Current Meds  Medication Sig   albuterol (PROVENTIL HFA;VENTOLIN HFA) 108 (90 Base) MCG/ACT inhaler Inhale 1-2 puffs into  the lungs every 6 (six) hours as needed for wheezing or shortness of breath.   allopurinol (ZYLOPRIM) 300 MG tablet Take 300 mg by mouth as needed (gout).    ALPRAZolam (XANAX) 1 MG tablet Take 1 mg by mouth 3 (three) times daily as needed for anxiety or sleep.    atorvastatin (LIPITOR) 80 MG tablet TAKE 1 TABLET BY MOUTH DAILY AT 6PM   clopidogrel (PLAVIX) 75 MG tablet TAKE 1 TABLET BY MOUTH EVERY DAY   colchicine 0.6 MG tablet Take 0.6 mg by mouth as needed.    cyclobenzaprine (FLEXERIL) 10 MG tablet Take 10 mg by mouth daily.   diphenhydrAMINE HCl (BENADRYL ALLERGY PO) Take by mouth.   lisinopril (PRINIVIL,ZESTRIL) 10 MG tablet TAKE 1 TABLET BY MOUTH EVERY DAY   metoprolol succinate (TOPROL-XL) 100 MG 24 hr tablet TAKE ONE TABLET BY MOUTH ONCE DAILY IN THE EVENING TAKE WITH OR IMMEDIATELY FOLLOWING A MEAL   nitroGLYCERIN (NITROSTAT) 0.4 MG SL tablet Place 1 tablet (0.4 mg total) under the tongue every 5 (five) minutes as needed for chest pain.   predniSONE (STERAPRED UNI-PAK 21 TAB) 10 MG (21) TBPK tablet as needed (gout).    sotalol (BETAPACE) 120 MG tablet Take 1 tablet (120 mg total) by mouth 2 (two) times daily.   tamsulosin (FLOMAX) 0.4 MG CAPS capsule TAKE 1 CAPSULE BY MOUTH EVERY DAY   XARELTO 20 MG TABS tablet TAKE 1 TABLET (20 MG TOTAL) BY MOUTH DAILY WITH SUPPER.     Allergies:   Patient has no known allergies.   Social History   Socioeconomic History   Marital status: Divorced    Spouse name: Not on file   Number of children: Not on file   Years of education: Not on file   Highest education level: Not on file  Occupational History   Not on file  Tobacco Use   Smoking status: Never   Smokeless tobacco: Never  Vaping Use   Vaping Use: Never used  Substance and Sexual Activity   Alcohol use: Yes    Comment: almost daily use of brandy or beer   Drug use: No   Sexual activity: Not on file  Other Topics Concern   Not on file  Social History Narrative   Not on file    Social Determinants of Health   Financial Resource Strain: Not on file  Food Insecurity: Not on file  Transportation Needs: Not on file  Physical Activity: Not on file  Stress: Not on file  Social Connections: Not on file     Family History: The patient's family history includes Atrial fibrillation in his mother; COPD in his father; Cholecystitis in his sister; Diabetes in his father; Mesothelioma in his father.  ROS:   Please see the history of present illness.     All other systems reviewed and are negative.  EKGs/Labs/Other Studies Reviewed:    The following studies  were reviewed today:  Nuclear stress test 05/25/17: The left ventricular ejection fraction is normal (55-65%). Nuclear stress EF: 64%. There was no ST segment deviation noted during stress. The study is normal.   1. EF 64%, normal wall motion. 2. No evidence for ischemia or infarction on perfusion images.  EKG:  EKG is  ordered today.  The ekg ordered today demonstrates sinus rhythm HR 67  Recent Labs: No results found for requested labs within last 8760 hours.  Recent Lipid Panel    Component Value Date/Time   CHOL 135 10/09/2018 0958   TRIG 217 (H) 10/09/2018 0958   HDL 28 (L) 10/09/2018 0958   CHOLHDL 4.8 10/09/2018 0958   CHOLHDL 8.3 07/06/2016 0307   VLDL 63 (H) 07/06/2016 0307   LDLCALC 64 10/09/2018 0958    Physical Exam:    VS:  BP 128/80   Pulse 67   Ht 6\' 1"  (1.854 m)   Wt 269 lb (122 kg)   SpO2 94%   BMI 35.49 kg/m     Wt Readings from Last 3 Encounters:  08/25/20 269 lb (122 kg)  10/09/18 270 lb (122.5 kg)  01/15/18 269 lb 9.6 oz (122.3 kg)     GEN: obese male in NAD HEENT: Normal NECK: No JVD; No carotid bruits LYMPHATICS: No lymphadenopathy CARDIAC: RRR, no murmurs, rubs, gallops RESPIRATORY:  Clear to auscultation without rales, wheezing or rhonchi  ABDOMEN: Soft, non-tender, non-distended MUSCULOSKELETAL:  No edema; No deformity  SKIN: Warm and dry NEUROLOGIC:   Alert and oriented x 3 PSYCHIATRIC:  Normal affect   ASSESSMENT:    1. Coronary artery disease of native artery of native heart with stable angina pectoris ([place])   2. PAF (paroxysmal atrial fibrillation) ([place])   3. Hyperlipidemia, unspecified hyperlipidemia type   4. Essential hypertension   5. Chronic diastolic heart failure (HCC)    PLAN:    In order of problems listed above:  PAF Chronic anticoagulation Anticoagulated with Xarelto, on sotalol.  This patients CHA2DS2-VASc Score and unadjusted Ischemic Stroke Rate (% per year) is equal to 2.2 % stroke rate/year from a score of 2 (CAD, HTN) - no bleeding problems, sinus rhythm today   CAD NSTEMI status post DES to RCA (06/2016) He continues on Plavix. No bleeding problems. No angina. He will start a walking program and continue losing weight. He wants to come off of some medications. We discussed if he lost more weight that this is a very good possibility. I would first stop 2.5 mg amlodipine.    Grade 1 diastolic dysfunction - eating better,euvolemic toay   Hypertension - maintained on amlodipine, lisinopril, metoprolol - BP well controlled - no medication changes - he wants to come off of some medications - with continued weight loss, may be able to stop amlodipine   Hyperlipidemia with LDL goal < 70 - LDL 64 in 09/2018 Lipid profile 03/17/20: Total chol: 132 HDL 32 LDL 72 Triglycerides 161 - he has started fish oil since these labs - recheck lipids next year   Follow up with me or Dr. Martinique in 1 year.     Medication Adjustments/Labs and Tests Ordered: Current medicines are reviewed at length with the patient today.  Concerns regarding medicines are outlined above.  Orders Placed This Encounter  Procedures   Lipid panel   EKG 12-Lead   No orders of the defined types were placed in this encounter.   Signed, [place], PA  08/25/2020 11:08 AM  [facility]

## 2020-08-25 ENCOUNTER — Other Ambulatory Visit: Payer: Self-pay

## 2020-08-25 ENCOUNTER — Ambulatory Visit: Payer: Medicare Other | Admitting: Physician Assistant

## 2020-08-25 ENCOUNTER — Encounter: Payer: Self-pay | Admitting: Physician Assistant

## 2020-08-25 VITALS — BP 128/80 | HR 67 | Ht 73.0 in | Wt 269.0 lb

## 2020-08-25 DIAGNOSIS — I10 Essential (primary) hypertension: Secondary | ICD-10-CM | POA: Diagnosis not present

## 2020-08-25 DIAGNOSIS — I25118 Atherosclerotic heart disease of native coronary artery with other forms of angina pectoris: Secondary | ICD-10-CM

## 2020-08-25 DIAGNOSIS — I480 Paroxysmal atrial fibrillation: Secondary | ICD-10-CM | POA: Diagnosis not present

## 2020-08-25 DIAGNOSIS — E785 Hyperlipidemia, unspecified: Secondary | ICD-10-CM | POA: Diagnosis not present

## 2020-08-25 DIAGNOSIS — I5032 Chronic diastolic (congestive) heart failure: Secondary | ICD-10-CM

## 2020-08-25 NOTE — Patient Instructions (Signed)
Medication Instructions:   No changes *If you need a refill on your cardiac medications before your next appointment, please call your pharmacy*   Lab Work: No labs If you have labs (blood work) drawn today and your tests are completely normal, you will receive your results only by: [place] (if you have MyChart) OR A paper copy in the mail If you have any lab test that is abnormal or we need to change your treatment, we will call you to review the results.   Testing/Procedures: No Testing   Follow-Up: At [facility], you and your health needs are our priority.  As part of our continuing mission to provide you with exceptional heart care, we have created designated Provider Care Teams.  These Care Teams include your primary Cardiologist (physician) and Advanced Practice Providers (APPs -  Physician Assistants and Nurse Practitioners) who all work together to provide you with the care you need, when you need it.  We recommend signing up for the patient portal called "MyChart".  Sign up information is provided on this After Visit Summary.  MyChart is used to connect with patients for Virtual Visits (Telemedicine).  Patients are able to view lab/test results, encounter notes, upcoming appointments, etc.  Non-urgent messages can be sent to your provider as well.   To learn more about what you can do with MyChart, go to [email or web address].    Your next appointment:   1 year(s)  The format for your next appointment:   In Person  Provider:   You may see Peter Martinique, MD or one of the following Advanced Practice Providers on your designated Care Team:   Almyra Deforest, PA-C Fabian Sharp, PA-C or  Roby Lofts, [place]

## 2021-09-19 DIAGNOSIS — I10 Essential (primary) hypertension: Secondary | ICD-10-CM | POA: Diagnosis not present

## 2021-09-19 DIAGNOSIS — I480 Paroxysmal atrial fibrillation: Secondary | ICD-10-CM | POA: Diagnosis not present

## 2021-11-01 DIAGNOSIS — J4520 Mild intermittent asthma, uncomplicated: Secondary | ICD-10-CM | POA: Diagnosis not present

## 2021-11-01 DIAGNOSIS — Z0001 Encounter for general adult medical examination with abnormal findings: Secondary | ICD-10-CM | POA: Diagnosis not present

## 2021-11-01 DIAGNOSIS — I10 Essential (primary) hypertension: Secondary | ICD-10-CM | POA: Diagnosis not present

## 2021-11-01 DIAGNOSIS — I4891 Unspecified atrial fibrillation: Secondary | ICD-10-CM | POA: Diagnosis not present

## 2021-11-01 DIAGNOSIS — I2510 Atherosclerotic heart disease of native coronary artery without angina pectoris: Secondary | ICD-10-CM | POA: Diagnosis not present

## 2021-11-01 DIAGNOSIS — I480 Paroxysmal atrial fibrillation: Secondary | ICD-10-CM | POA: Diagnosis not present

## 2021-11-01 DIAGNOSIS — E559 Vitamin D deficiency, unspecified: Secondary | ICD-10-CM | POA: Diagnosis not present

## 2021-11-08 ENCOUNTER — Encounter (INDEPENDENT_AMBULATORY_CARE_PROVIDER_SITE_OTHER): Payer: Self-pay | Admitting: *Deleted

## 2022-02-07 ENCOUNTER — Ambulatory Visit (INDEPENDENT_AMBULATORY_CARE_PROVIDER_SITE_OTHER): Payer: Medicare Other | Admitting: Gastroenterology

## 2022-02-23 ENCOUNTER — Ambulatory Visit (INDEPENDENT_AMBULATORY_CARE_PROVIDER_SITE_OTHER): Payer: Medicare Other | Admitting: Gastroenterology

## 2022-02-23 ENCOUNTER — Telehealth: Payer: Self-pay

## 2022-02-23 ENCOUNTER — Encounter: Payer: Self-pay | Admitting: Gastroenterology

## 2022-02-23 VITALS — BP 135/91 | HR 68 | Temp 97.9°F | Ht 73.0 in | Wt 285.4 lb

## 2022-02-23 DIAGNOSIS — K589 Irritable bowel syndrome without diarrhea: Secondary | ICD-10-CM | POA: Insufficient documentation

## 2022-02-23 DIAGNOSIS — K580 Irritable bowel syndrome with diarrhea: Secondary | ICD-10-CM | POA: Diagnosis not present

## 2022-02-23 MED ORDER — OMEPRAZOLE 20 MG PO CPDR: 20.0000 mg | DELAYED_RELEASE_CAPSULE | Freq: Every day | ORAL | 3 refills | Status: DC

## 2022-02-23 MED ORDER — RIFAXIMIN 550 MG PO TABS: 550.0000 mg | ORAL_TABLET | Freq: Three times a day (TID) | ORAL | 0 refills | Status: AC

## 2022-02-23 NOTE — Progress Notes (Addendum)
Gastroenterology Office Note    Referring Provider: Ginger Organ Primary Care Physician:  Cory Munch, PA-C  Primary GI: Dr. Abbey Chatters    Chief Complaint   Chief Complaint  Patient presents with   New Patient (Initial Visit)    Pt being referred for diarrhea     History of Present Illness   Chad Ellis is a 61 y.o. male presenting today at the request of Cory Munch, PA-C due to diarrhea.   Notes long-standing history of intermittent loose stool and urgency.  In mornings, will have normal BM. Will have looser stool again prior to leaving for work. Will have urgency to have BMs and sometimes has to stop multiple places. Has lots of gas/bloating. Chronic GERD. No PPI currently. Will take one of his friend's pills occasionally. He feels this has worked well and only takes as needed. Has had weight gain. No weight loss or lack of appetite. Has taken [place] in the past and noted improvement with this.   Not able to identify food triggers.    Colonoscopy at age 78. EGD same year.  No FH colon cancer. No FH colon polyps.   Not interested in pursuing a colonoscopy right now but willing to discuss in future.     Past Medical History:  Diagnosis Date   Anxiety    Coronary artery disease    a. 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 06/2016: Dx'd and started on Sotolol and Xarelto    Past Surgical History:  Procedure Laterality Date   CHONDROPLASTY Right 05/10/2012   Procedure: CHONDROPLASTY;  Surgeon: Carole Civil, MD;  Location: [facility];  Service: Orthopedics;  Laterality: Right;  of patella   COLONOSCOPY     CORONARY STENT INTERVENTION N/A 07/06/2016   Procedure: Coronary Stent Intervention;  Surgeon: Troy Sine, MD;  Location: [place] [facility];  Service:  Cardiovascular;  Laterality: N/A;   ESOPHAGOGASTRODUODENOSCOPY     EXAM UNDER ANESTHESIA WITH MANIPULATION OF KNEE Right 05/10/2012   Procedure: EXAM UNDER ANESTHESIA WITH MANIPULATION OF KNEE;  Surgeon: Carole Civil, MD;  Location: [facility];  Service: Orthopedics;  Laterality: Right;  start 0750 end 751   KNEE ARTHROSCOPY WITH EXCISION PLICA Right 4/80/1655   Procedure: KNEE ARTHROSCOPY WITH EXCISION PLICA;  Surgeon: Carole Civil, MD;  Location: [facility];  Service: Orthopedics;  Laterality: Right;   KNEE ARTHROSCOPY WITH MEDIAL MENISECTOMY Right 05/10/2012   Procedure: KNEE ARTHROSCOPY WITH MEDIAL MENISECTOMY;  Surgeon: Carole Civil, MD;  Location: [facility];  Service: Orthopedics;  Laterality: Right;   LEFT HEART CATH AND CORONARY ANGIOGRAPHY N/A 07/06/2016   Procedure: Left Heart Cath and Coronary Angiography;  Surgeon: Troy Sine, MD;  Location: [place] [facility];  Service: Cardiovascular;  Laterality: N/A;    Current Outpatient Medications  Medication Sig Dispense Refill   albuterol (PROVENTIL HFA;VENTOLIN HFA) 108 (90 Base) MCG/ACT inhaler Inhale 1-2 puffs into the lungs every 6 (six) hours as needed for wheezing or shortness of breath.     allopurinol (ZYLOPRIM) 300 MG tablet Take 300 mg by mouth as needed (gout).      ALPRAZolam (XANAX) 1 MG tablet Take 1 mg by mouth 3 (three) times daily as needed for anxiety  or sleep.      amLODipine (NORVASC) 2.5 MG tablet Take 1 tablet (2.5 mg total) by mouth daily. 7 tablet 1   atorvastatin (LIPITOR) 80 MG tablet TAKE 1 TABLET BY MOUTH DAILY AT 6PM 90 tablet 2   lisinopril (PRINIVIL,ZESTRIL) 10 MG tablet TAKE 1 TABLET BY MOUTH EVERY DAY 90 tablet 2   metoprolol succinate (TOPROL-XL) 100 MG 24 hr tablet TAKE ONE TABLET BY MOUTH ONCE DAILY IN THE EVENING TAKE WITH OR IMMEDIATELY FOLLOWING A MEAL 30 tablet 0   nitroGLYCERIN (NITROSTAT) 0.4 MG SL tablet Place 1 tablet (0.4 mg total) under the tongue every 5 (five) minutes as needed for  chest pain. 25 tablet 12   rifaximin (XIFAXAN) 550 MG TABS tablet Take 1 tablet (550 mg total) by mouth 3 (three) times daily for 14 days. 42 tablet 0   sotalol (BETAPACE) 120 MG tablet Take 1 tablet (120 mg total) by mouth 2 (two) times daily. 180 tablet 3   XARELTO 20 MG TABS tablet TAKE 1 TABLET (20 MG TOTAL) BY MOUTH DAILY WITH SUPPER. 90 tablet 1   No current facility-administered medications for this visit.    Allergies as of 02/23/2022   (No Known Allergies)    Family History  Problem Relation Age of Onset   Atrial fibrillation Mother    COPD Father    Mesothelioma Father    Diabetes Father    Cholecystitis Sister     Social History   Socioeconomic History   Marital status: Divorced    Spouse name: Not on file   Number of children: Not on file   Years of education: Not on file   Highest education level: Not on file  Occupational History   Not on file  Tobacco Use   Smoking status: Never   Smokeless tobacco: Never  Vaping Use   Vaping Use: Never used  Substance and Sexual Activity   Alcohol use: Yes    Comment: every other day use of brandy or beer   Drug use: No   Sexual activity: Not on file  Other Topics Concern   Not on file  Social History Narrative   Not on file   Social Determinants of Health   Financial Resource Strain: Not on file  Food Insecurity: Not on file  Transportation Needs: Not on file  Physical Activity: Not on file  Stress: Not on file  Social Connections: Not on file  Intimate Partner Violence: Not on file     Review of Systems   Gen: Denies any fever, chills, fatigue, weight loss, lack of appetite.  CV: Denies chest pain, heart palpitations, peripheral edema, syncope.  Resp: Denies shortness of breath at rest or with exertion. Denies wheezing or cough.  GI: Denies dysphagia or odynophagia. Denies jaundice, hematemesis, fecal incontinence. GU : Denies urinary burning, urinary frequency, urinary hesitancy MS: Denies joint  pain, muscle weakness, cramps, or limitation of movement.  Derm: Denies rash, itching, dry skin Psych: Denies depression, anxiety, memory loss, and confusion Heme: Denies bruising, bleeding, and enlarged lymph nodes.   Physical Exam   BP (!) 135/91   Pulse 68   Temp 97.9 F (36.6 C)   Ht '6\' 1"'$  (1.854 m)   Wt 285 lb 6.4 oz (129.5 kg)   BMI 37.65 kg/m  General:   Alert and oriented. Pleasant and cooperative. Well-nourished and well-developed.  Head:  Normocephalic and atraumatic. Eyes:  Without icterus Ears:  Normal auditory acuity. Lungs:  Clear to auscultation bilaterally.  Heart:  S1, S2 present without murmurs appreciated.  Abdomen:  +BS, soft, non-tender and non-distended. No HSM noted. No guarding or rebound. No masses appreciated.  Rectal:  Deferred  Msk:  Symmetrical without gross deformities. Normal posture. Extremities:  Without edema. Neurologic:  Alert and  oriented x4;  grossly normal neurologically. Skin:  Intact without significant lesions or rashes. Psych:  Alert and cooperative. Normal mood and affect.   Assessment   Chad Ellis is a 61 y.o. male presenting today at the request of Cory Munch, PA-C due to diarrhea.   Long-standing history. Symptoms most consistent with IBS. No alarm signs/symptoms. Notably, he has had improvement with probiotics in the past. Last colonoscopy at age 49, and he is overdue for routine screening. He would like to hold off on this until next visit. Will trial 2 week course of Xifaxan.   GERD: no dysphagia. EGD at age 31. He has taken a friend's medication (PPI) before with good results; he is unsure of the name and will call with an update. Will see if this can be covered and send in.     PLAN   Course of Xifaxan TID X 14 days  Patient to call with PPI name and will send in  3 month follow-up  Discuss colonoscopy at next visit   Annitta Needs, PhD, ANP-BC [facility]    Patient stated  omeprazole. Will send this in.

## 2022-02-23 NOTE — Patient Instructions (Signed)
I have sent in Xifaxan to take three times a day for 2 weeks. We might have to talk to insurance about this for coverage, but we will see!  You can take the probiotic as well if you would like to continue this.  Please call with an update of the reflux medication you have taken before!  We will see you back in 3 months!  It was a pleasure to see you today. I want to create trusting relationships with patients to provide genuine, compassionate, and quality care. I value your feedback. If you receive a survey regarding your visit,  I greatly appreciate you taking time to fill this out.   Annitta Needs, PhD, ANP-BC [facility]

## 2022-02-23 NOTE — Telephone Encounter (Signed)
PA done for Xifaxan '550mg'$  on Cover My Meds. Dx used: IBS-D. Tried/failed: nothing. Waiting on a response from Cover My Meds

## 2022-02-23 NOTE — Addendum Note (Signed)
Addended by: Annitta Needs on: 02/23/2022 01:46 PM   Modules accepted: Orders

## 2022-03-15 NOTE — Telephone Encounter (Signed)
Documentation from [employer]/ [employer] from [facility] authorization number [id number] for Xifaxan valid February 23, 2022 through March 15, 2022 is being given. Will give to Chad Ellis to scan into pt's chart

## 2022-03-17 ENCOUNTER — Telehealth: Payer: Self-pay

## 2022-03-17 NOTE — Telephone Encounter (Signed)
Pt called and wants to be scheduled for his colonoscopy know. The pt also wants to know the cost of the medications for the procedure. Please advise

## 2022-03-21 MED ORDER — PEG 3350-KCL-NA BICARB-NACL 420 G PO SOLR: 4000.0000 mL | Freq: Once | ORAL | 0 refills | Status: AC

## 2022-03-21 NOTE — Telephone Encounter (Signed)
We can go ahead and arrange colonoscopy with Dr. Abbey Chatters, ASA 3. Routine screening.   HOLD XARELTO 48 hours prior.   I do not know the answer regarding the medications. Tammy/Mindy, please help with that part. Thanks!

## 2022-03-21 NOTE — Telephone Encounter (Signed)
PA approved via [facility]. Auth# [id number], DOS: Apr 17, 2022 - Jul 16, 2022

## 2022-03-21 NOTE — Addendum Note (Signed)
Addended by: Cheron Every on: 03/21/2022 03:38 PM   Modules accepted: Orders

## 2022-03-21 NOTE — Telephone Encounter (Signed)
noted 

## 2022-03-21 NOTE — Telephone Encounter (Signed)
Called pt. He has been scheduled for 2/5 at 7:30am. Aware will mail pre-op/prep instructions. Rx for prep sent to [facility]. Aware to check with them regarding cost. Also aware to hold xarelto 48 hrs prior.

## 2022-03-23 ENCOUNTER — Encounter: Payer: Self-pay | Admitting: *Deleted

## 2022-04-11 NOTE — Patient Instructions (Addendum)
Chad Ellis  04/11/2022     '@PREFPERIOPPHARMACY'$ @   Your procedure is scheduled on  04/17/2022.   Report to [facility] at  600  A.M.   Call this number if you have problems the morning of surgery:  [phone]  If you experience any cold or flu symptoms such as cough, fever, chills, shortness of breath, etc. between now and your scheduled surgery, please notify us at the above number.   Remember:  Follow the diet and prep instructions given to you by the office.        Your last dose of xarelto should be on 04/14/2022.    Take these medicines the morning of surgery with A SIP OF WATER        allopurinol, xanax(if needed), amlodipine, metoprolol, omeprazole, satolol, flomax.         Use your inhaler before you come and bring your rescue inhaler with you.    Do not wear jewelry, make-up or nail polish.  Do not wear lotions, powders, or perfumes, or deodorant.  Do not shave 48 hours prior to surgery.  Men may shave face and neck.  Do not bring valuables to the hospital.  [facility] is not responsible for any belongings or valuables.  Contacts, dentures or bridgework may not be worn into surgery.  Leave your suitcase in the car.  After surgery it may be brought to your room.  For patients admitted to the hospital, discharge time will be determined by your treatment team.  Patients discharged the day of surgery will not be allowed to drive home and must have someone with them for 24 hours.    Special instructions:   DO NOT smoke tobacco or vape for 24 hours before your procedure.  Please read over the following fact sheets that you were given. Anesthesia Post-op Instructions and Care and Recovery After Surgery      Colonoscopy, Adult, Care After The following information offers guidance on how to care for yourself after your procedure. Your health care provider may also give you more specific instructions. If you have problems or questions, contact your  health care provider. What can I expect after the procedure? After the procedure, it is common to have: A small amount of blood in your stool for 24 hours after the procedure. Some gas. Mild cramping or bloating of your abdomen. Follow these instructions at home: Eating and drinking  Drink enough fluid to keep your urine pale yellow. Follow instructions from your health care provider about eating or drinking restrictions. Resume your normal diet as told by your health care provider. Avoid heavy or fried foods that are hard to digest. Activity Rest as told by your health care provider. Avoid sitting for a long time without moving. Get up to take short walks every 1-2 hours. This is important to improve blood flow and breathing. Ask for help if you feel weak or unsteady. Return to your normal activities as told by your health care provider. Ask your health care provider what activities are safe for you. Managing cramping and bloating  Try walking around when you have cramps or feel bloated. If directed, apply heat to your abdomen as told by your health care provider. Use the heat source that your health care provider recommends, such as a moist heat pack or a heating pad. Place a towel between your skin and the heat source. Leave the heat on for 20-30 minutes. Remove the heat if  your skin turns bright red. This is especially important if you are unable to feel pain, heat, or cold. You have a greater risk of getting burned. General instructions If you were given a sedative during the procedure, it can affect you for several hours. Do not drive or operate machinery until your health care provider says that it is safe. For the first 24 hours after the procedure: Do not sign important documents. Do not drink alcohol. Do your regular daily activities at a slower pace than normal. Eat soft foods that are easy to digest. Take over-the-counter and prescription medicines only as told by your  health care provider. Keep all follow-up visits. This is important. Contact a health care provider if: You have blood in your stool 2-3 days after the procedure. Get help right away if: You have more than a small spotting of blood in your stool. You have large blood clots in your stool. You have swelling of your abdomen. You have nausea or vomiting. You have a fever. You have increasing pain in your abdomen that is not relieved with medicine. These symptoms may be an emergency. Get help right away. Call 911. Do not wait to see if the symptoms will go away. Do not drive yourself to the hospital. Summary After the procedure, it is common to have a small amount of blood in your stool. You may also have mild cramping and bloating of your abdomen. If you were given a sedative during the procedure, it can affect you for several hours. Do not drive or operate machinery until your health care provider says that it is safe. Get help right away if you have a lot of blood in your stool, nausea or vomiting, a fever, or increased pain in your abdomen. This information is not intended to replace advice given to you by your health care provider. Make sure you discuss any questions you have with your health care provider. Document Revised: 10/20/2020 Document Reviewed: 10/20/2020 Elsevier Patient Education  [place] After The following information offers guidance on how to care for yourself after your procedure. Your health care provider may also give you more specific instructions. If you have problems or questions, contact your health care provider. What can I expect after the procedure? After the procedure, it is common to have: Tiredness. Little or no memory about what happened during or after the procedure. Impaired judgment when it comes to making decisions. Nausea or vomiting. Some trouble with balance. Follow these instructions at home: For the time  period you were told by your health care provider:  Rest. Do not participate in activities where you could fall or become injured. Do not drive or use machinery. Do not drink alcohol. Do not take sleeping pills or medicines that cause drowsiness. Do not make important decisions or sign legal documents. Do not take care of children on your own. Medicines Take over-the-counter and prescription medicines only as told by your health care provider. If you were prescribed antibiotics, take them as told by your health care provider. Do not stop using the antibiotic even if you start to feel better. Eating and drinking Follow instructions from your health care provider about what you may eat and drink. Drink enough fluid to keep your urine pale yellow. If you vomit: Drink clear fluids slowly and in small amounts as you are able. Clear fluids include water, ice chips, low-calorie sports drinks, and fruit juice that has water added to it (diluted  fruit juice). Eat light and bland foods in small amounts as you are able. These foods include bananas, applesauce, rice, lean meats, toast, and crackers. General instructions  Have a responsible adult stay with you for the time you are told. It is important to have someone help care for you until you are awake and alert. If you have sleep apnea, surgery and some medicines can increase your risk for breathing problems. Follow instructions from your health care provider about wearing your sleep device: When you are sleeping. This includes during daytime naps. While taking prescription pain medicines, sleeping medicines, or medicines that make you drowsy. Do not use any products that contain nicotine or tobacco. These products include cigarettes, chewing tobacco, and vaping devices, such as e-cigarettes. If you need help quitting, ask your health care provider. Contact a health care provider if: You feel nauseous or vomit every time you eat or drink. You feel  light-headed. You are still sleepy or having trouble with balance after 24 hours. You get a rash. You have a fever. You have redness or swelling around the IV site. Get help right away if: You have trouble breathing. You have new confusion after you get home. These symptoms may be an emergency. Get help right away. Call 911. Do not wait to see if the symptoms will go away. Do not drive yourself to the hospital. This information is not intended to replace advice given to you by your health care provider. Make sure you discuss any questions you have with your health care provider. Document Revised: 07/25/2021 Document Reviewed: 07/25/2021 Elsevier Patient Education  [place].

## 2022-04-12 ENCOUNTER — Encounter (HOSPITAL_COMMUNITY): Payer: Self-pay

## 2022-04-12 ENCOUNTER — Encounter (HOSPITAL_COMMUNITY)
Admission: RE | Admit: 2022-04-12 | Discharge: 2022-04-12 | Disposition: A | Discharge status: Home / Self Care | Payer: Medicare Other | Source: Ambulatory Visit | Attending: Internal Medicine | Admitting: Internal Medicine

## 2022-04-12 VITALS — BP 121/94 | HR 92 | Temp 98.2°F | Resp 20 | Ht 73.0 in | Wt 300.0 lb

## 2022-04-12 DIAGNOSIS — Z01810 Encounter for preprocedural cardiovascular examination: Secondary | ICD-10-CM | POA: Insufficient documentation

## 2022-04-12 DIAGNOSIS — I10 Essential (primary) hypertension: Secondary | ICD-10-CM | POA: Insufficient documentation

## 2022-04-12 HISTORY — DX: Cardiac arrhythmia, unspecified: I49.9

## 2022-04-12 HISTORY — DX: Unspecified asthma, uncomplicated: J45.909

## 2022-04-12 HISTORY — DX: Acute myocardial infarction, unspecified: I21.9

## 2022-04-17 ENCOUNTER — Encounter (HOSPITAL_COMMUNITY)
Admission: RE | Disposition: A | Discharge status: Home / Self Care | Payer: Self-pay | Source: Home / Self Care | Attending: Internal Medicine

## 2022-04-17 ENCOUNTER — Encounter (HOSPITAL_COMMUNITY): Payer: Self-pay

## 2022-04-17 ENCOUNTER — Ambulatory Visit (HOSPITAL_BASED_OUTPATIENT_CLINIC_OR_DEPARTMENT_OTHER): Payer: Medicare Other | Admitting: Anesthesiology

## 2022-04-17 ENCOUNTER — Ambulatory Visit (HOSPITAL_COMMUNITY): Payer: Medicare Other | Admitting: Anesthesiology

## 2022-04-17 ENCOUNTER — Ambulatory Visit (HOSPITAL_COMMUNITY)
Admission: RE | Admit: 2022-04-17 | Discharge: 2022-04-17 | Disposition: A | Discharge status: Home / Self Care | Payer: Medicare Other | Attending: Internal Medicine | Admitting: Internal Medicine

## 2022-04-17 DIAGNOSIS — Z7901 Long term (current) use of anticoagulants: Secondary | ICD-10-CM | POA: Insufficient documentation

## 2022-04-17 DIAGNOSIS — E785 Hyperlipidemia, unspecified: Secondary | ICD-10-CM | POA: Insufficient documentation

## 2022-04-17 DIAGNOSIS — I2510 Atherosclerotic heart disease of native coronary artery without angina pectoris: Secondary | ICD-10-CM | POA: Insufficient documentation

## 2022-04-17 DIAGNOSIS — K5730 Diverticulosis of large intestine without perforation or abscess without bleeding: Secondary | ICD-10-CM

## 2022-04-17 DIAGNOSIS — I10 Essential (primary) hypertension: Secondary | ICD-10-CM | POA: Diagnosis not present

## 2022-04-17 DIAGNOSIS — Z79899 Other long term (current) drug therapy: Secondary | ICD-10-CM | POA: Diagnosis not present

## 2022-04-17 DIAGNOSIS — D128 Benign neoplasm of rectum: Secondary | ICD-10-CM | POA: Insufficient documentation

## 2022-04-17 DIAGNOSIS — K621 Rectal polyp: Secondary | ICD-10-CM

## 2022-04-17 DIAGNOSIS — Z1211 Encounter for screening for malignant neoplasm of colon: Secondary | ICD-10-CM | POA: Diagnosis not present

## 2022-04-17 DIAGNOSIS — F419 Anxiety disorder, unspecified: Secondary | ICD-10-CM | POA: Insufficient documentation

## 2022-04-17 DIAGNOSIS — K52832 Lymphocytic colitis: Secondary | ICD-10-CM | POA: Diagnosis not present

## 2022-04-17 DIAGNOSIS — I252 Old myocardial infarction: Secondary | ICD-10-CM

## 2022-04-17 DIAGNOSIS — Z6839 Body mass index (BMI) 39.0-39.9, adult: Secondary | ICD-10-CM | POA: Diagnosis not present

## 2022-04-17 DIAGNOSIS — D127 Benign neoplasm of rectosigmoid junction: Secondary | ICD-10-CM | POA: Diagnosis not present

## 2022-04-17 DIAGNOSIS — J45909 Unspecified asthma, uncomplicated: Secondary | ICD-10-CM | POA: Diagnosis not present

## 2022-04-17 DIAGNOSIS — I480 Paroxysmal atrial fibrillation: Secondary | ICD-10-CM | POA: Diagnosis not present

## 2022-04-17 DIAGNOSIS — Z955 Presence of coronary angioplasty implant and graft: Secondary | ICD-10-CM | POA: Insufficient documentation

## 2022-04-17 DIAGNOSIS — Z139 Encounter for screening, unspecified: Secondary | ICD-10-CM | POA: Diagnosis not present

## 2022-04-17 HISTORY — PX: COLONOSCOPY WITH PROPOFOL: SHX5780

## 2022-04-17 HISTORY — PX: POLYPECTOMY: SHX5525

## 2022-04-17 SURGERY — COLONOSCOPY WITH PROPOFOL
Anesthesia: General

## 2022-04-17 MED ORDER — PROPOFOL 10 MG/ML IV BOLUS
INTRAVENOUS | Status: DC | PRN
  Administered 2022-04-17: 30 mg via INTRAVENOUS
  Administered 2022-04-17: 20 mg via INTRAVENOUS
  Administered 2022-04-17: 100 mg via INTRAVENOUS
  Administered 2022-04-17: 30 mg via INTRAVENOUS
  Administered 2022-04-17: 50 mg via INTRAVENOUS

## 2022-04-17 MED ORDER — LACTATED RINGERS IV SOLN: INTRAVENOUS | Status: DC | PRN

## 2022-04-17 MED ORDER — LIDOCAINE HCL (CARDIAC) PF 100 MG/5ML IV SOSY
PREFILLED_SYRINGE | INTRAVENOUS | Status: DC | PRN
  Administered 2022-04-17: 50 mg via INTRAVENOUS

## 2022-04-17 NOTE — Op Note (Signed)
[facility] Patient Name: Chad Ellis Procedure Date: 04/17/2022 6:46 AM MRN: [id number] Date of Birth: 28-Jun-1960 Attending MD: Elon Alas. Abbey Chatters , [place], [id number] CSN: [id number] Age: 62 Admit Type: Outpatient Procedure:                Colonoscopy Indications:              Screening for colorectal malignant neoplasm Providers:                Elon Alas. Abbey Chatters, DO, Caprice Kluver, Janeece Riggers, RN,                            Raphael Gibney, Technician Referring MD:              Medicines:                See the Anesthesia note for documentation of the                            administered medications Complications:            No immediate complications. Estimated Blood Loss:     Estimated blood loss was minimal. Procedure:                Pre-Anesthesia Assessment:                           - The anesthesia plan was to use monitored                            anesthesia care (MAC).                           After obtaining informed consent, the colonoscope                            was passed under direct vision. Throughout the                            procedure, the patient's blood pressure, pulse, and                            oxygen saturations were monitored continuously. The                            [id number]-HQ190L ([id number]) scope was introduced through                            the anus and advanced to the the cecum, identified                            by appendiceal orifice and ileocecal valve. The                            colonoscopy was performed without difficulty. The                            patient tolerated the  procedure well. The quality                            of the bowel preparation was evaluated using the                            BBPS [facility] Bowel Preparation Scale) with scores                            of: Right Colon = 3, Transverse Colon = 3 and Left                            Colon = 3 (entire mucosa seen well with no residual                             staining, small fragments of stool or opaque                            liquid). The total BBPS score equals 9. Scope In: 7:34:42 AM Scope Out: 7:48:10 AM Scope Withdrawal Time: 0 hours 12 minutes 3 seconds  Total Procedure Duration: 0 hours 13 minutes 28 seconds  Findings:      The perianal and digital rectal examinations were normal.      Multiple large-mouthed and small-mouthed diverticula were found in the       sigmoid colon and descending colon.      Two sessile polyps were found in the rectum and recto-sigmoid colon. The       polyps were 4 to 5 mm in size. These polyps were removed with a cold       snare. Resection and retrieval were complete.      The exam was otherwise without abnormality. Impression:               - Diverticulosis in the sigmoid colon and in the                            descending colon.                           - Two 4 to 5 mm polyps in the rectum and at the                            recto-sigmoid colon, removed with a cold snare.                            Resected and retrieved.                           - The examination was otherwise normal. Moderate Sedation:      Per Anesthesia Care Recommendation:           - Patient has a contact number available for                            emergencies. The signs and symptoms of potential  delayed complications were discussed with the                            patient. Return to normal activities tomorrow.                            Written discharge instructions were provided to the                            patient.                           - Resume previous diet.                           - Continue present medications.                           - Await pathology results.                           - Repeat colonoscopy in 7-10 years for surveillance.                           - Return to GI clinic in 3 months. Procedure Code(s):        --- Professional ---                            [id number], Colonoscopy, flexible; with removal of                            tumor(s), polyp(s), or other lesion(s) by snare                            technique Diagnosis Code(s):        --- Professional ---                           Z12.11, Encounter for screening for malignant                            neoplasm of colon                           D12.8, Benign neoplasm of rectum                           D12.7, Benign neoplasm of rectosigmoid junction                           K57.30, Diverticulosis of large intestine without                            perforation or abscess without bleeding CPT copyright 2022 American Medical Association. All rights reserved. The codes documented in this report are preliminary and upon coder review may  be revised to meet current compliance requirements. Elon Alas. Abbey Chatters,  DO Elon Alas. Abbey Chatters, DO 04/17/2022 7:50:43 AM This report has been signed electronically. Number of Addenda: 0

## 2022-04-17 NOTE — Transfer of Care (Signed)
Immediate Anesthesia Transfer of Care Note  Patient: Chad Ellis  Procedure(s) Performed: COLONOSCOPY WITH PROPOFOL POLYPECTOMY  Patient Location: Short Stay  Anesthesia Type:General  Level of Consciousness: awake  Airway & Oxygen Therapy: Patient Spontanous Breathing  Post-op Assessment: Report given to RN and Post -op Vital signs reviewed and stable  Post vital signs: Reviewed and stable  Last Vitals:  Vitals Value Taken Time  BP 74/49 04/17/22 0752  Temp 36.7 C 04/17/22 0752  Pulse 65 04/17/22 0752  Resp 24 04/17/22 0752  SpO2 92 % 04/17/22 0752    Last Pain:  Vitals:   04/17/22 0752  TempSrc: Axillary  PainSc:       Patients Stated Pain Goal: 6 (36/12/24 4975)  Complications: No notable events documented.

## 2022-04-17 NOTE — Anesthesia Preprocedure Evaluation (Signed)
Anesthesia Evaluation  Patient identified by MRN, date of birth, ID band Patient awake    Reviewed: Allergy & Precautions, H&P , NPO status , Patient's Chart, lab work & pertinent test results, reviewed documented beta blocker date and time   Airway Mallampati: II  TM Distance: >3 FB Neck ROM: full    Dental no notable dental hx.    Pulmonary neg pulmonary ROS, asthma    Pulmonary exam normal breath sounds clear to auscultation       Cardiovascular Exercise Tolerance: Good hypertension, + CAD and + Past MI  negative cardio ROS + dysrhythmias Atrial Fibrillation  Rhythm:regular Rate:Normal     Neuro/Psych   Anxiety     negative neurological ROS  negative psych ROS   GI/Hepatic negative GI ROS, Neg liver ROS,,,  Endo/Other    Morbid obesity  Renal/GU negative Renal ROS  negative genitourinary   Musculoskeletal   Abdominal   Peds  Hematology negative hematology ROS (+)   Anesthesia Other Findings   Reproductive/Obstetrics negative OB ROS                             Anesthesia Physical Anesthesia Plan  ASA: 3  Anesthesia Plan: General   Post-op Pain Management:    Induction:   PONV Risk Score and Plan: Propofol infusion  Airway Management Planned:   Additional Equipment:   Intra-op Plan:   Post-operative Plan:   Informed Consent: I have reviewed the patients History and Physical, chart, labs and discussed the procedure including the risks, benefits and alternatives for the proposed anesthesia with the patient or authorized representative who has indicated his/her understanding and acceptance.     Dental Advisory Given  Plan Discussed with: CRNA  Anesthesia Plan Comments:        Anesthesia Quick Evaluation

## 2022-04-17 NOTE — Discharge Instructions (Signed)
  Colonoscopy Discharge Instructions  Read the instructions outlined below and refer to this sheet in the next few weeks. These discharge instructions provide you with general information on caring for yourself after you leave the hospital. Your doctor may also give you specific instructions. While your treatment has been planned according to the most current medical practices available, unavoidable complications occasionally occur.   ACTIVITY You may resume your regular activity, but move at a slower pace for the next 24 hours.  Take frequent rest periods for the next 24 hours.  Walking will help get rid of the air and reduce the bloated feeling in your belly (abdomen).  No driving for 24 hours (because of the medicine (anesthesia) used during the test).   Do not sign any important legal documents or operate any machinery for 24 hours (because of the anesthesia used during the test).  NUTRITION Drink plenty of fluids.  You may resume your normal diet as instructed by your doctor.  Begin with a light meal and progress to your normal diet. Heavy or fried foods are harder to digest and may make you feel sick to your stomach (nauseated).  Avoid alcoholic beverages for 24 hours or as instructed.  MEDICATIONS You may resume your normal medications unless your doctor tells you otherwise.  WHAT YOU CAN EXPECT TODAY Some feelings of bloating in the abdomen.  Passage of more gas than usual.  Spotting of blood in your stool or on the toilet paper.  IF YOU HAD POLYPS REMOVED DURING THE COLONOSCOPY: No aspirin products for 7 days or as instructed.  No alcohol for 7 days or as instructed.  Eat a soft diet for the next 24 hours.  FINDING OUT THE RESULTS OF YOUR TEST Not all test results are available during your visit. If your test results are not back during the visit, make an appointment with your caregiver to find out the results. Do not assume everything is normal if you have not heard from your  caregiver or the medical facility. It is important for you to follow up on all of your test results.  SEEK IMMEDIATE MEDICAL ATTENTION IF: You have more than a spotting of blood in your stool.  Your belly is swollen (abdominal distention).  You are nauseated or vomiting.  You have a temperature over 101.  You have abdominal pain or discomfort that is severe or gets worse throughout the day.   Your colonoscopy revealed 2 polyp(s) which I removed successfully. Await pathology results, my office will contact you. I recommend repeating colonoscopy in 7-10 years for surveillance purposes depending on pathology results.   You also have diverticulosis. I would recommend increasing fiber in your diet or adding OTC Benefiber/Metamucil. Be sure to drink at least 4 to 6 glasses of water daily. Follow-up with GI in 2-3 months   I hope you have a great rest of your week!  Elon Alas. Abbey Chatters, D.O. Gastroenterology and Hepatology [facility]

## 2022-04-17 NOTE — H&P (Signed)
Primary Care Physician:  [facility] Primary Gastroenterologist:  Dr. Abbey Chatters  Pre-Procedure History & Physical: HPI:  Chad Ellis is a 62 y.o. male is here for a colonoscopy for colon cancer screening purposes.  Patient denies any family history of colorectal cancer.  No melena or hematochezia.  No abdominal pain or unintentional weight loss.  No change in bowel habits.  Overall feels well from a GI standpoint.  Past Medical History:  Diagnosis Date   Anxiety    Asthma    Coronary artery disease    a. 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 06/2016: Dx'd and started on Sotolol and Xarelto    Past Surgical History:  Procedure Laterality Date   CHONDROPLASTY Right 05/10/2012   Procedure: CHONDROPLASTY;  Surgeon: Carole Civil, MD;  Location: [facility];  Service: Orthopedics;  Laterality: Right;  of patella   COLONOSCOPY     CORONARY STENT INTERVENTION N/A 07/06/2016   Procedure: Coronary Stent Intervention;  Surgeon: Troy Sine, MD;  Location: [place] [facility];  Service: Cardiovascular;  Laterality: N/A;   ESOPHAGOGASTRODUODENOSCOPY     EXAM UNDER ANESTHESIA WITH MANIPULATION OF KNEE Right 05/10/2012   Procedure: EXAM UNDER ANESTHESIA WITH MANIPULATION OF KNEE;  Surgeon: Carole Civil, MD;  Location: [facility];  Service: Orthopedics;  Laterality: Right;  start 0750 end 751   KNEE ARTHROSCOPY WITH EXCISION PLICA Right 06/26/6061   Procedure: KNEE ARTHROSCOPY WITH EXCISION PLICA;  Surgeon: Carole Civil, MD;  Location: [facility];  Service: Orthopedics;  Laterality: Right;   KNEE ARTHROSCOPY WITH MEDIAL MENISECTOMY Right 05/10/2012   Procedure: KNEE ARTHROSCOPY WITH MEDIAL MENISECTOMY;  Surgeon: Carole Civil, MD;  Location: [facility];   Service: Orthopedics;  Laterality: Right;   LEFT HEART CATH AND CORONARY ANGIOGRAPHY N/A 07/06/2016   Procedure: Left Heart Cath and Coronary Angiography;  Surgeon: Troy Sine, MD;  Location: [place] [facility];  Service: Cardiovascular;  Laterality: N/A;    Prior to Admission medications   Medication Sig Start Date End Date Taking? Authorizing Provider  albuterol (PROVENTIL HFA;VENTOLIN HFA) 108 (90 Base) MCG/ACT inhaler Inhale 1-2 puffs into the lungs every 6 (six) hours as needed for wheezing or shortness of breath.   Yes [provider]  allopurinol (ZYLOPRIM) 300 MG tablet Take 300 mg by mouth daily.   Yes [provider]  ALPRAZolam Duanne Moron) 1 MG tablet Take 1 mg by mouth 3 (three) times daily as needed for anxiety or sleep.    Yes [provider]  amLODipine (NORVASC) 2.5 MG tablet Take 1 tablet (2.5 mg total) by mouth daily. 12/12/19 04/17/22 Yes Duke, Tami Lin, PA  atorvastatin (LIPITOR) 80 MG tablet TAKE 1 TABLET BY MOUTH DAILY AT 6PM 07/17/19  Yes Martinique, Peter M, MD  clobetasol (TEMOVATE) 0.05 % external solution Apply 1 Application topically daily as needed (psoriasis). 03/31/22  Yes [provider]  guaiFENesin (MUCINEX) 600 MG 12 hr tablet Take by mouth 2 (two) times daily as needed.   Yes [provider]  lisinopril (PRINIVIL,ZESTRIL) 10 MG tablet TAKE 1 TABLET BY MOUTH EVERY DAY 01/14/18  Yes Martinique, Peter M, MD  loperamide (IMODIUM) 2 MG capsule Take 1  mg by mouth as needed for diarrhea or loose stools.   Yes [provider]  MELATONIN PO Take 1 tablet by mouth at bedtime.   Yes [provider]  metoprolol succinate (TOPROL-XL) 100 MG 24 hr tablet TAKE ONE TABLET BY MOUTH ONCE DAILY IN THE EVENING TAKE WITH OR IMMEDIATELY FOLLOWING A MEAL 02/04/19  Yes Martinique, Peter M, MD  nitroGLYCERIN (NITROSTAT) 0.4 MG SL tablet Place 1 tablet (0.4 mg total) under the tongue every 5 (five) minutes as needed for chest pain. 04/10/17   Yes Martinique, Peter M, MD  Omega-3 Fatty Acids (SALMON OIL-1000 PO) Take 1,000 mg by mouth in the morning and at bedtime.   Yes [provider]  omeprazole (PRILOSEC) 20 MG capsule Take 1 capsule (20 mg total) by mouth daily. 30 minutes before breakfast. For reflux. Patient taking differently: Take 20 mg by mouth daily as needed (acid reflux). 02/23/22  Yes Annitta Needs, NP  sotalol (BETAPACE) 120 MG tablet Take 1 tablet (120 mg total) by mouth 2 (two) times daily. 12/02/18  Yes Martinique, Peter M, MD  tamsulosin (FLOMAX) 0.4 MG CAPS capsule Take 0.4 mg by mouth daily.   Yes [provider]  XARELTO 20 MG TABS tablet TAKE 1 TABLET (20 MG TOTAL) BY MOUTH DAILY WITH SUPPER. 12/03/18  Yes Duke, Tami Lin, PA  Cholecalciferol (VITAMIN D-3) 125 MCG (5000 UT) TABS Take 5,000 Units by mouth in the morning and at bedtime.    [provider]    Allergies as of 03/21/2022   (No Known Allergies)    Family History  Problem Relation Age of Onset   Atrial fibrillation Mother    COPD Father    Mesothelioma Father    Diabetes Father    Cholecystitis Sister     Social History   Socioeconomic History   Marital status: Divorced    Spouse name: Not on file   Number of children: Not on file   Years of education: Not on file   Highest education level: Not on file  Occupational History   Not on file  Tobacco Use   Smoking status: Never   Smokeless tobacco: Never  Vaping Use   Vaping Use: Never used  Substance and Sexual Activity   Alcohol use: Yes    Comment: every other day use of brandy or beer   Drug use: No   Sexual activity: Not on file  Other Topics Concern   Not on file  Social History Narrative   Not on file   Social Determinants of Health   Financial Resource Strain: Not on file  Food Insecurity: Not on file  Transportation Needs: Not on file  Physical Activity: Not on file  Stress: Not on file  Social Connections: Not on file  Intimate Partner  Violence: Not on file    Review of Systems: See HPI, otherwise negative ROS  Physical Exam: Vital signs in last 24 hours: Temp:  [98 F (36.7 C)] 98 F (36.7 C) (02/05 0641) Pulse Rate:  [72] 72 (02/05 0641) BP: (110)/(80) 110/80 (02/05 0641) SpO2:  [95 %] 95 % (02/05 0641)   General:   Alert,  Well-developed, well-nourished, pleasant and cooperative in NAD Head:  Normocephalic and atraumatic. Eyes:  Sclera clear, no icterus.   Conjunctiva pink. Ears:  Normal auditory acuity. Nose:  No deformity, discharge,  or lesions. Msk:  Symmetrical without gross deformities. Normal posture. Extremities:  Without clubbing or edema. Neurologic:  Alert and  oriented x4;  grossly normal neurologically. Skin:  Intact without significant lesions or rashes. Psych:  Alert and cooperative. Normal mood and affect.  Impression/Plan: Chad Ellis is here for a colonoscopy to be performed for colon cancer screening purposes.  The risks of the procedure including infection, bleed, or perforation as well as benefits, limitations, alternatives and imponderables have been reviewed with the patient. Questions have been answered. All parties agreeable.

## 2022-04-18 LAB — SURGICAL PATHOLOGY

## 2022-04-18 NOTE — Anesthesia Postprocedure Evaluation (Signed)
Anesthesia Post Note  Patient: Chad Ellis  Procedure(s) Performed: COLONOSCOPY WITH PROPOFOL POLYPECTOMY  Patient location during evaluation: Phase II Anesthesia Type: General Level of consciousness: awake Pain management: pain level controlled Vital Signs Assessment: post-procedure vital signs reviewed and stable Respiratory status: spontaneous breathing and respiratory function stable Cardiovascular status: blood pressure returned to baseline and stable Postop Assessment: no headache and no apparent nausea or vomiting Anesthetic complications: no Comments: Late entry   No notable events documented.   Last Vitals:  Vitals:   04/17/22 0756 04/17/22 0758  BP: (!) 86/59 96/63  Pulse: 67 66  Resp: 16 14  Temp:    SpO2: 92% 93%    Last Pain:  Vitals:   04/17/22 0752  TempSrc: Axillary  PainSc:                  [name]

## 2022-04-24 ENCOUNTER — Encounter (HOSPITAL_COMMUNITY): Payer: Self-pay | Admitting: Internal Medicine

## 2022-04-27 ENCOUNTER — Telehealth: Payer: Self-pay | Admitting: Gastroenterology

## 2022-04-27 MED ORDER — BUDESONIDE 3 MG PO CPEP: 9.0000 mg | ORAL_CAPSULE | Freq: Every day | ORAL | 1 refills | Status: DC

## 2022-04-27 NOTE — Telephone Encounter (Signed)
Budesonide sent to pharmacy

## 2022-05-19 ENCOUNTER — Other Ambulatory Visit: Payer: Self-pay | Admitting: Gastroenterology

## 2022-05-25 ENCOUNTER — Ambulatory Visit: Payer: Medicare Other | Admitting: Gastroenterology

## 2022-06-01 NOTE — Progress Notes (Unsigned)
GI Office Note    Referring Provider: [facility] Primary Care Physician:  [place] Primary Gastroenterologist: Elon Alas. Abbey Chatters, DO  Date:  06/05/2022  ID:  Chad Ellis, DOB [birth year], MRN [id number]   Chief Complaint   Chief Complaint  Patient presents with   Follow-up    Follow up on lymphotic colitis   History of Present Illness  Chad Ellis is a 62 y.o. male with a history of diarrhea, anxiety, Dressler's syndrome, HLD, HTN, Afib presenting today for follow up post procedure.   EGD and Colonoscopy at age 31. Had it done at [facility]. Reports he had diverticulosis and not sure about polyps.   Last office visit 02/23/22. Chronic intermittent loose stools and urgency. Normal BM in the morning followed by looser stool before leaving the house. Has urgency and sometimes has to stop multiple places on trips to have a BM. Gas/bloating present. No weight loss or lack of appetite. Tried [facility] colon health in the past and noted improvement. Advised to complete trial of Xifaxin but unable to afford.   Colonoscopy 04/17/22: -sigmoid and descending diverticulosis -2 (4-78mm) polyps in the rectum and rectosigmoid.  -Rectal polyp with tubular adenoma and evidence of lymphocytic colitis -Sent in budesonide for patient to take 9 mg daily for 8 weeks and then taper.  -Repeat in 5 years.   Today:  Diarrhea - Took the steroids and reports instead of going 4-5 times per day he is having 1-2 per day. Still having to have a BM in the morning and then go back in 30 minutes or so. Gas pains have not been nearly as bad. Has a few occasionally. Has cut back on his chicken wings and things and has been eating more vegetables. No melena or BRBPR. No lack of appetite. No unintentional weight loss.   GERD - Does not  take the omeprazole everyday.has been tacking it every other day and that is working well. Has occasional gas pains. He reports his main issue  is dry mouth at night.   Has been cutting back open his salt intake.   Report his family members have had issues with hemorrhoid removal and other stomach issues.   Current Outpatient Medications  Medication Sig Dispense Refill   albuterol (PROVENTIL HFA;VENTOLIN HFA) 108 (90 Base) MCG/ACT inhaler Inhale 1-2 puffs into the lungs every 6 (six) hours as needed for wheezing or shortness of breath.     allopurinol (ZYLOPRIM) 300 MG tablet Take 300 mg by mouth daily.     ALPRAZolam (XANAX) 1 MG tablet Take 1 mg by mouth 3 (three) times daily as needed for anxiety or sleep.      atorvastatin (LIPITOR) 80 MG tablet TAKE 1 TABLET BY MOUTH DAILY AT 6PM 90 tablet 2   budesonide (ENTOCORT EC) 3 MG 24 hr capsule Take 3 capsules (9 mg total) by mouth daily. 90 capsule 1   Cholecalciferol (VITAMIN D-3) 125 MCG (5000 UT) TABS Take 5,000 Units by mouth in the morning and at bedtime.     clobetasol (TEMOVATE) 0.05 % external solution Apply 1 Application topically daily as needed (psoriasis).     lisinopril (PRINIVIL,ZESTRIL) 10 MG tablet TAKE 1 TABLET BY MOUTH EVERY DAY 90 tablet 2   loperamide (IMODIUM) 2 MG capsule Take 1 mg by mouth as needed for diarrhea or loose stools.     MELATONIN PO Take 1 tablet by mouth at bedtime.     metoprolol succinate (TOPROL-XL) 100 MG  24 hr tablet TAKE ONE TABLET BY MOUTH ONCE DAILY IN THE EVENING TAKE WITH OR IMMEDIATELY FOLLOWING A MEAL 30 tablet 0   Omega-3 Fatty Acids (SALMON OIL-1000 PO) Take 1,000 mg by mouth in the morning and at bedtime.     omeprazole (PRILOSEC) 20 MG capsule Take 1 capsule (20 mg total) by mouth daily. 30 minutes before breakfast. For reflux. (Patient taking differently: Take 20 mg by mouth daily as needed (acid reflux).) 90 capsule 3   sotalol (BETAPACE) 120 MG tablet Take 1 tablet (120 mg total) by mouth 2 (two) times daily. 180 tablet 3   tamsulosin (FLOMAX) 0.4 MG CAPS capsule Take 0.4 mg by mouth daily.     XARELTO 20 MG TABS tablet TAKE 1  TABLET (20 MG TOTAL) BY MOUTH DAILY WITH SUPPER. 90 tablet 1   amLODipine (NORVASC) 2.5 MG tablet Take 1 tablet (2.5 mg total) by mouth daily. 7 tablet 1   No current facility-administered medications for this visit.    Past Medical History:  Diagnosis Date   Anxiety    Asthma    Coronary artery disease    a. 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 06/2016: Dx'd and started on Sotolol and Xarelto    Past Surgical History:  Procedure Laterality Date   CHONDROPLASTY Right 05/10/2012   Procedure: CHONDROPLASTY;  Surgeon: Carole Civil, MD;  Location: [facility];  Service: Orthopedics;  Laterality: Right;  of patella   COLONOSCOPY     COLONOSCOPY WITH PROPOFOL N/A 04/17/2022   Procedure: COLONOSCOPY WITH PROPOFOL;  Surgeon: Eloise Harman, DO;  Location[facility];  Service: Endoscopy;  Laterality: N/A;  730am, asa 3   CORONARY STENT INTERVENTION N/A 07/06/2016   Procedure: Coronary Stent Intervention;  Surgeon: Troy Sine, MD;  Location: [place] [facility];  Service: Cardiovascular;  Laterality: N/A;   ESOPHAGOGASTRODUODENOSCOPY     EXAM UNDER ANESTHESIA WITH MANIPULATION OF KNEE Right 05/10/2012   Procedure: EXAM UNDER ANESTHESIA WITH MANIPULATION OF KNEE;  Surgeon: Carole Civil, MD;  Location: [facility];  Service: Orthopedics;  Laterality: Right;  start 0750 end 751   KNEE ARTHROSCOPY WITH EXCISION PLICA Right [id number]   Procedure: KNEE ARTHROSCOPY WITH EXCISION PLICA;  Surgeon: Carole Civil, MD;  Location: [facility];  Service: Orthopedics;  Laterality: Right;   KNEE ARTHROSCOPY WITH MEDIAL MENISECTOMY Right 05/10/2012   Procedure: KNEE ARTHROSCOPY WITH MEDIAL MENISECTOMY;  Surgeon: Carole Civil, MD;  Location: [facility];  Service: Orthopedics;   Laterality: Right;   LEFT HEART CATH AND CORONARY ANGIOGRAPHY N/A 07/06/2016   Procedure: Left Heart Cath and Coronary Angiography;  Surgeon: Troy Sine, MD;  Location: [place] [facility];  Service: Cardiovascular;  Laterality: N/A;   POLYPECTOMY  04/17/2022   Procedure: POLYPECTOMY;  Surgeon: Eloise Harman, DO;  [facility]: [facility];  Service: Endoscopy;;    Family History  Problem Relation Age of Onset   Atrial fibrillation Mother    COPD Father    Mesothelioma Father    Diabetes Father    Cholecystitis Sister     Allergies as of 06/05/2022   (No Known Allergies)    Social History   Socioeconomic History  Marital status: Divorced    Spouse name: Not on file   Number of children: Not on file   Years of education: Not on file   Highest education level: Not on file  Occupational History   Not on file  Tobacco Use   Smoking status: Never   Smokeless tobacco: Never  Vaping Use   Vaping Use: Never used  Substance and Sexual Activity   Alcohol use: Yes    Comment: every other day use of brandy or beer   Drug use: No   Sexual activity: Not on file  Other Topics Concern   Not on file  Social History Narrative   Not on file   Social Determinants of Health   Financial Resource Strain: Not on file  Food Insecurity: Not on file  Transportation Needs: Not on file  Physical Activity: Not on file  Stress: Not on file  Social Connections: Not on file     Review of Systems   Gen: Denies fever, chills, anorexia. Denies fatigue, weakness, weight loss.  CV: Denies chest pain, palpitations, syncope, peripheral edema, and claudication. Resp: Denies dyspnea at rest, cough, wheezing, coughing up blood, and pleurisy. GI: See HPI Derm: Denies rash, itching, dry skin Psych: Denies depression, anxiety, memory loss, confusion. No homicidal or suicidal ideation.  Heme: Denies bruising, bleeding, and enlarged lymph nodes.   Physical Exam   BP (!) 138/91   Pulse 69    Temp (!) 97.5 F (36.4 C)   Ht 6\' 1"  (1.854 m)   Wt 280 lb 12.8 oz (127.4 kg)   BMI 37.05 kg/m   General:   Alert and oriented. No distress noted. Pleasant and cooperative.  Head:  Normocephalic and atraumatic. Eyes:  Conjuctiva clear without scleral icterus. Mouth:  Oral mucosa pink and moist. Good dentition. No lesions. Lungs:  Clear to auscultation bilaterally. No wheezes, rales, or rhonchi. No distress.  Heart:  S1, S2 present without murmurs appreciated.  Abdomen:  +BS, soft, non-tender and non-distended. No rebound or guarding. No HSM or masses noted. Rectal: deferred Msk:  Symmetrical without gross deformities. Normal posture. Extremities:  Without edema. Neurologic:  Alert and  oriented x4 Psych:  Alert and cooperative. Normal mood and affect.   Assessment  Chad Ellis is a 62 y.o. male with a history of diarrhea, anxiety, Dressler's syndrome, HLD, HTN, Afib presenting today for follow up post procedure.   IBS-D. Lymphocytic colitis: Previously having chronic intermittent loose stools with urgency.  Previously having 4-5 loose stools per day, usually in the mornings.  Noted relief with Chad Ellis' colon health in the past.  Unable to afford trial of Xifaxan for IBS.  Underwent colonoscopy 04/17/2022 with evidence of lymphocytic colitis and was started on budesonide.  He ran out of his budesonide after 4 weeks and did not pick up his refill.  However he has had some improvement in his diarrhea with 1-2/day instead of 4-5 and having a lot less gas pains.  Advised him to take the rest of his budesonide for 4 weeks and then slowly taper, see AVS for instructions.  GERD: Controlled on omeprazole every other day.  PLAN   Continue budesonide 9mg  for 4 weeks then take 6 mg for 2 weeks, 3 mg for 2 weeks, then stop. Refill sent today.  Continue omeprazole 20 mg as needed. Request records from [facility] - prior TCS and EGD Follow up in 8-10 weeks.    Venetia Night, MSN, FNP-BC,  AGACNP-BC [facility]

## 2022-06-05 ENCOUNTER — Encounter: Payer: Self-pay | Admitting: Gastroenterology

## 2022-06-05 ENCOUNTER — Ambulatory Visit (INDEPENDENT_AMBULATORY_CARE_PROVIDER_SITE_OTHER): Payer: Medicare Other | Admitting: Gastroenterology

## 2022-06-05 VITALS — BP 138/91 | HR 69 | Temp 97.5°F | Ht 73.0 in | Wt 280.8 lb

## 2022-06-05 DIAGNOSIS — K52832 Lymphocytic colitis: Secondary | ICD-10-CM | POA: Diagnosis not present

## 2022-06-05 DIAGNOSIS — K580 Irritable bowel syndrome with diarrhea: Secondary | ICD-10-CM

## 2022-06-05 MED ORDER — BUDESONIDE 3 MG PO CPEP: 9.0000 mg | ORAL_CAPSULE | Freq: Every day | ORAL | 0 refills | Status: AC

## 2022-06-05 MED ORDER — BUDESONIDE 3 MG PO CPEP: ORAL_CAPSULE | ORAL | 0 refills | Status: DC

## 2022-06-05 NOTE — Patient Instructions (Addendum)
Continue budesonide 9mg  for 4 weeks then take 6 mg for 2 weeks, 3 mg for 2 weeks, then stop.    Continue omeprazole 20 mg every other day as needed.   Please fill out a records request form to get your prior colonoscopy and EGD records from [facility].  We will plan to follow-up in 8-10 weeks.  It was a pleasure to see you today. I want to create trusting relationships with patients. If you receive a survey regarding your visit,  I greatly appreciate you taking time to fill this out on paper or through your MyChart. I value your feedback.  Venetia Night, MSN, FNP-BC, AGACNP-BC [facility]

## 2022-07-19 DIAGNOSIS — I480 Paroxysmal atrial fibrillation: Secondary | ICD-10-CM | POA: Diagnosis not present

## 2022-07-19 DIAGNOSIS — I10 Essential (primary) hypertension: Secondary | ICD-10-CM | POA: Diagnosis not present

## 2022-08-01 ENCOUNTER — Other Ambulatory Visit: Payer: Self-pay | Admitting: Gastroenterology

## 2022-08-01 DIAGNOSIS — K52832 Lymphocytic colitis: Secondary | ICD-10-CM

## 2022-08-03 ENCOUNTER — Telehealth (HOSPITAL_COMMUNITY): Payer: Self-pay | Admitting: *Deleted

## 2022-08-03 DIAGNOSIS — H8110 Benign paroxysmal vertigo, unspecified ear: Secondary | ICD-10-CM | POA: Diagnosis not present

## 2022-08-03 DIAGNOSIS — I951 Orthostatic hypotension: Secondary | ICD-10-CM | POA: Diagnosis not present

## 2022-08-03 NOTE — Telephone Encounter (Signed)
Pt left message on afib clinic voicemail for Dr Elvis Coil office stating recently having hypotension with standing. Saw PCP today and they are recommending medication changes of his BP medication due to recent weight loss as they feel pt may be overmedicated. Will forward to Dr Elvis Coil office for further review. Best reached at [phone]

## 2022-08-03 NOTE — Telephone Encounter (Signed)
Last 4 days issues when standing to fast, he was getting dizzy and little "cloudy vision". He assumed it was BP as this happened before.  PCP did orthostatics today. . Systolic was 2 digits, and was told to cut the amlodipine. He states he takes this at night and it is in the morning when he gets up that he has issues. He states he checked his sugar at 151, lost 30 lbs and has 3 BP medications. The PCP also said he thought patient was in AfiB.  Patient states no symptoms related to this.     He states he saw PCP today and was told to stop his amlodipine.   Spoke with Mardella Layman nurse at PCP office She states he was told to stop Amlodipine 2.5.  His BP as follows: Arrival Left arm sitting 98/80 Lying R arm 118/80 Sitting R arm 108/80 Standing R arm 118/88 They will fax his paperwork to [place].  I advised him to hold amlodipine this evening if he does not hear back tonight.  Please advise

## 2022-08-03 NOTE — Telephone Encounter (Signed)
Pt has another appointment at this time. Appointment changed to 5/28 at 3:35pm. Pt verbalizes understanding.

## 2022-08-03 NOTE — Telephone Encounter (Signed)
Sw[place], Peter M, MD  Cv Div Nl Triage; Charna Elizabeth, [place] minutes ago (3:28 PM)    I would defer to PCP recommendations. We have not seen in 2 years. He has an Ecg from Jan showing Afib. I don't know who ordered this or why and no comment in chart about his Afib at that time. Sounds like he should get a follow up visit with [place]  Peter Swaziland MD, [facility]   Spoke with pt regarding Dr. Elvis Coil recommendations. Pt would like to get scheduled to be seen back in the office. Dr. Swaziland had limited availability, able to schedule office visit with Azalee Course, PA-C on 5/28. Pt does mention that he recently lost 30 pounds and is now having symptoms of low blood pressure. Pt will bring a list of his medications. Pt is appreciative and verbalizes understanding.

## 2022-08-03 NOTE — Telephone Encounter (Signed)
Pt is requesting call back to discuss upcoming appt.

## 2022-08-08 ENCOUNTER — Telehealth: Payer: Self-pay | Admitting: Gastroenterology

## 2022-08-08 ENCOUNTER — Ambulatory Visit (INDEPENDENT_AMBULATORY_CARE_PROVIDER_SITE_OTHER): Payer: Medicare Other | Admitting: Gastroenterology

## 2022-08-08 ENCOUNTER — Ambulatory Visit: Payer: Medicare Other | Attending: Physician Assistant | Admitting: Cardiology

## 2022-08-08 ENCOUNTER — Encounter: Payer: Self-pay | Admitting: Physician Assistant

## 2022-08-08 ENCOUNTER — Ambulatory Visit: Payer: Medicare Other | Admitting: Physician Assistant

## 2022-08-08 VITALS — BP 122/86 | HR 65 | Ht 73.0 in | Wt 259.4 lb

## 2022-08-08 DIAGNOSIS — E785 Hyperlipidemia, unspecified: Secondary | ICD-10-CM | POA: Diagnosis not present

## 2022-08-08 DIAGNOSIS — I10 Essential (primary) hypertension: Secondary | ICD-10-CM | POA: Diagnosis not present

## 2022-08-08 DIAGNOSIS — I480 Paroxysmal atrial fibrillation: Secondary | ICD-10-CM | POA: Diagnosis not present

## 2022-08-08 DIAGNOSIS — I2510 Atherosclerotic heart disease of native coronary artery without angina pectoris: Secondary | ICD-10-CM | POA: Diagnosis not present

## 2022-08-08 MED ORDER — RIVAROXABAN 20 MG PO TABS: 20.0000 mg | ORAL_TABLET | Freq: Every day | ORAL | 0 refills | Status: DC

## 2022-08-08 NOTE — Telephone Encounter (Signed)
I looked at the notes and his appt was cancelled by [facility] in Nov of last year. I have no idea why he was bringing meds to me because he seen Toni Amend last in March. He just needs to go on to his appt with his heart Dr and take them his medications because I didn't contact him. Did you check with [facility] about it or [facility]

## 2022-08-08 NOTE — Patient Instructions (Addendum)
Medication Instructions:  YOU ARE NO LONGER TO TAKE AMLODIPINE MEDICATION.  GIVEN SAMPLE OF XARELTO 20 MG  *If you need a refill on your cardiac medications before your next appointment, please call your pharmacy*  Lab Work: CMP, LIPIDS  If you have labs (blood work) drawn today and your tests are completely normal, you will receive your results only by: MyChart Message (if you have MyChart) OR A paper copy in the mail If you have any lab test that is abnormal or we need to change your treatment, we will call you to review the results.  Follow-Up: At [facility], you and your health needs are our priority.  As part of our continuing mission to provide you with exceptional heart care, we have created designated Provider Care Teams.  These Care Teams include your primary Cardiologist (physician) and Advanced Practice Providers (APPs -  Physician Assistants and Nurse Practitioners) who all work together to provide you with the care you need, when you need it.  We recommend signing up for the patient portal called "MyChart".  Sign up information is provided on this After Visit Summary.  MyChart is used to connect with patients for Virtual Visits (Telemedicine).  Patients are able to view lab/test results, encounter notes, upcoming appointments, etc.  Non-urgent messages can be sent to your provider as well.   To learn more about what you can do with MyChart, go to [email or web address].    Your next appointment:   1 year(s)  Provider:   Peter [place], MD

## 2022-08-08 NOTE — Progress Notes (Signed)
Cardiology Office Note:    Date:  08/08/2022   ID:  [name], DOB 26-May-1960, MRN [id number]  PCP:  [facility]    HeartCare Providers Cardiologist:  Peter Swaziland, MD {    Referring MD: [facility]   Chief Complaint  Patient presents with   Follow-up    CAD, afib    History of Present Illness:    Chad Ellis is a 62 y.o. male with a hx of HTN, HLD, CAD s/p DES to RCA in 2018, PAF on Xarelto. He was admitted in 06/2016 after presenting to the ED complaining of chest pain. He was found to be in afib with RVR. He underwent left heart cath on 07/06/16 that showed acute/subacute proximal RCA occlusion with extensive thrombus burden. This was treated with thrombectomy, PTCA and DES. Otherwise, there was mild nonobstructive concomitant CAD. Echocardiogram on 07/08/16 showed EF 55-60%, grade I diastolic dysfunction, mildly dilated aortic root measuring 38 mm. He was seen by Dr. Johney Frame and was started on sotalol. Patient was discharged on ASA, plavix, and Xarelto for 1 month with plans to drop ASA. He later had a nuclear stress test on 05/25/17 that showed EF 64%, no evidence of ischemia or infarction.   Patient was last seen by cardiology on 08/25/20. At that time, he denied chest pain. He was trying to exercise more and lose weight. He was maintained on sotalol, xarelto, lisinopril, metoprolol.  Patient presents today for his annual follow up. He reports that a few weeks ago, he started to have some dizziness upon standing. He was seen by his PCP who stopped amlodipine due to low BP. Since stopping the amlodipine, he has not had further dizziness. Denies chest pain, palpitations, shortness of breath, ankle edema. Denies bleeding issues on xarelto. Xarelto is very expensive. Denies palpitations and is unable to tell if he has had episodes of afib.   Past Medical History:  Diagnosis Date   Anxiety    Asthma    Coronary artery disease    a.  06/2016: NSTEMI s/p DES to RCA   Diverticulosis    Dressler's syndrome (HCC)    Dysrhythmia    Ectopic atrial rhythm    HLD (hyperlipidemia)    HTN (hypertension)    Myocardial infarction (HCC)    2017   PAF (paroxysmal atrial fibrillation) (HCC)    a. 06/2016: Dx'd and started on Sotolol and Xarelto    Past Surgical History:  Procedure Laterality Date   CHONDROPLASTY Right 05/10/2012   Procedure: CHONDROPLASTY;  Surgeon: Vickki Hearing, MD;  Location: [facility];  Service: Orthopedics;  Laterality: Right;  of patella   COLONOSCOPY     COLONOSCOPY WITH PROPOFOL N/A 04/17/2022   Procedure: COLONOSCOPY WITH PROPOFOL;  Surgeon: Lanelle Bal, DO;  Location: [facility];  Service: Endoscopy;  Laterality: N/A;  730am, asa 3   CORONARY STENT INTERVENTION N/A 07/06/2016   Procedure: Coronary Stent Intervention;  Surgeon: Lennette Bihari, MD;  Location: [facility] INVASIVE CV LAB;  Service: Cardiovascular;  Laterality: N/A;   ESOPHAGOGASTRODUODENOSCOPY     EXAM UNDER ANESTHESIA WITH MANIPULATION OF KNEE Right 05/10/2012   Procedure: EXAM UNDER ANESTHESIA WITH MANIPULATION OF KNEE;  Surgeon: Vickki Hearing, MD;  Location: [facility];  Service: Orthopedics;  Laterality: Right;  start 0750 end 751   KNEE ARTHROSCOPY WITH EXCISION PLICA Right 05/10/2012   Procedure: KNEE ARTHROSCOPY WITH EXCISION PLICA;  Surgeon: Vickki Hearing, MD;  Location: [facility];  Service: Orthopedics;  Laterality: Right;   KNEE ARTHROSCOPY WITH MEDIAL MENISECTOMY Right 05/10/2012   Procedure: KNEE ARTHROSCOPY WITH MEDIAL MENISECTOMY;  Surgeon: Vickki Hearing, MD;  Location: [facility];  Service: Orthopedics;  Laterality: Right;   LEFT HEART CATH AND CORONARY ANGIOGRAPHY N/A 07/06/2016   Procedure: Left Heart Cath and Coronary Angiography;  Surgeon: Lennette Bihari, MD;  Location: [facility] INVASIVE CV LAB;  Service: Cardiovascular;  Laterality: N/A;   POLYPECTOMY  04/17/2022   Procedure: POLYPECTOMY;  Surgeon: Lanelle Bal, DO;   Location: [facility];  Service: Endoscopy;;    Current Medications: Current Meds  Medication Sig   albuterol (PROVENTIL HFA;VENTOLIN HFA) 108 (90 Base) MCG/ACT inhaler Inhale 1-2 puffs into the lungs every 6 (six) hours as needed for wheezing or shortness of breath.   allopurinol (ZYLOPRIM) 300 MG tablet Take 300 mg by mouth daily.   ALPRAZolam (XANAX) 1 MG tablet Take 1 mg by mouth 3 (three) times daily as needed for anxiety or sleep.    atorvastatin (LIPITOR) 80 MG tablet TAKE 1 TABLET BY MOUTH DAILY AT 6PM   budesonide (ENTOCORT EC) 3 MG 24 hr capsule Take 2 tablets daily (6 mg) for 2 weeks, the one tablet (3 mg) daily for 2 weeks, then stop.   Cholecalciferol (VITAMIN D-3) 125 MCG (5000 UT) TABS Take 5,000 Units by mouth in the morning and at bedtime.   clobetasol (TEMOVATE) 0.05 % external solution Apply 1 Application topically daily as needed (psoriasis).   clopidogrel (PLAVIX) 75 MG tablet Take 75 mg by mouth daily.   lisinopril (PRINIVIL,ZESTRIL) 10 MG tablet TAKE 1 TABLET BY MOUTH EVERY DAY   loperamide (IMODIUM) 2 MG capsule Take 1 mg by mouth as needed for diarrhea or loose stools.   meclizine (ANTIVERT) 25 MG tablet Take 25 mg by mouth every 6 (six) hours as needed for dizziness.   MELATONIN PO Take 1 tablet by mouth at bedtime.   metoprolol succinate (TOPROL-XL) 100 MG 24 hr tablet TAKE ONE TABLET BY MOUTH ONCE DAILY IN THE EVENING TAKE WITH OR IMMEDIATELY FOLLOWING A MEAL   Omega-3 Fatty Acids (SALMON OIL-1000 PO) Take 1,000 mg by mouth in the morning and at bedtime.   omeprazole (PRILOSEC) 20 MG capsule Take 1 capsule (20 mg total) by mouth daily. 30 minutes before breakfast. For reflux. (Patient taking differently: Take 20 mg by mouth daily as needed (acid reflux).)   rivaroxaban (XARELTO) 20 MG TABS tablet Take 1 tablet (20 mg total) by mouth daily with supper.   sotalol (BETAPACE) 120 MG tablet Take 1 tablet (120 mg total) by mouth 2 (two) times daily.   tamsulosin  (FLOMAX) 0.4 MG CAPS capsule Take 0.4 mg by mouth daily.   XARELTO 20 MG TABS tablet TAKE 1 TABLET (20 MG TOTAL) BY MOUTH DAILY WITH SUPPER.     Allergies:   Patient has no known allergies.   Social History   Socioeconomic History   Marital status: Divorced    Spouse name: Not on file   Number of children: Not on file   Years of education: Not on file   Highest education level: Not on file  Occupational History   Not on file  Tobacco Use   Smoking status: Never   Smokeless tobacco: Never  Vaping Use   Vaping Use: Never used  Substance and Sexual Activity   Alcohol use: Yes    Comment: every other day use of brandy or beer   Drug use:  No   Sexual activity: Not on file  Other Topics Concern   Not on file  Social History Narrative   Not on file   Social Determinants of Health   Financial Resource Strain: Not on file  Food Insecurity: Not on file  Transportation Needs: Not on file  Physical Activity: Not on file  Stress: Not on file  Social Connections: Not on file     Family History: The patient's family history includes Atrial fibrillation in his mother; COPD in his father; Cholecystitis in his sister; Diabetes in his father; Mesothelioma in his father.  ROS:   Please see the history of present illness.     All other systems reviewed and are negative.  EKGs/Labs/Other Studies Reviewed:    The following studies were reviewed today: Cardiac Studies & Procedures   CARDIAC CATHETERIZATION  CARDIAC CATHETERIZATION 07/06/2016  Narrative  1st Mrg lesion, 20 %stenosed.  Mid LAD lesion, 20 %stenosed.  A STENT SYNERGY DES 3.5X38 drug eluting stent was successfully placed.  Prox RCA to Mid RCA lesion, 100 %stenosed.  Post intervention, there is a 0% residual stenosis.  The left ventricular ejection fraction is 45-50% by visual estimate.  There is mild left ventricular systolic dysfunction.  Acute/subacute very proximal RCA occlusion with extensive thrombus  burden and TIMI 0 flow.  Mild nonobstructive concomitant CAD with 20% LAD stenosis and 20% circumflex narrowing and a large left circumflex vessel.  There was a hint of very minimal if any distal collateralization of the PLA vessel from the RCA via the left injection.  Difficult but successful PCI to the totally occluded RCA with extensive thrombus requiring thrombectomy, PTCA, and ultimate DES stenting with a 3.538 mm Synergy DES stent postdilated to 4.14 mm very proximally and 3.9-4.0 mm distally in a very large RCA that supplies the PDA and PLA vessel.  RECOMMENDATION: Dual antiplatelet therapy.  The patient will continue on Aggrastat for 18 hours post procedure.  The patient will be started on high potency statin therapy, and medical therapy post MI including ACE-I/ARB, and beta blockerRx.  Findings Coronary Findings Diagnostic  Dominance: Co-dominant  Left Anterior Descending  Left Circumflex  First Obtuse Marginal Branch  Right Coronary Artery  Intervention  Prox RCA to Mid RCA lesion Angioplasty Pre-stent angioplasty was performed using a BALLOON MOZEC 2.0X12. A STENT SYNERGY DES 3.5X38 drug eluting stent was successfully placed. Stent strut is well apposed. Post-stent angioplasty was performed using a BALLOON [place] MOZEC 4.0X28. Supplies used: BALLN MOZEC 3.0X38; BALLN [place] MR 2.5X15; BALLN [place] MOZEC 4.0X8 There is a 0% residual stenosis post intervention.   STRESS TESTS  MYOCARDIAL PERFUSION IMAGING 05/25/2017  Narrative  The left ventricular ejection fraction is normal (55-65%).  Nuclear stress EF: 64%.  There was no ST segment deviation noted during stress.  The study is normal.  1. EF 64%, normal wall motion. 2. No evidence for ischemia or infarction on perfusion images.  Normal study.   ECHOCARDIOGRAM  ECHOCARDIOGRAM COMPLETE 07/08/2016  Narrative *[place]* *[facility]* [address] [place], [place]  [address] [phone]  ------------------------------------------------------------------- Transthoracic Echocardiography  Patient:    Abraham, Bitzer MR #:       [id number] Study Date: 07/08/2016 Gender:     M Age:        55 Height:     185.4 cm Weight:     129.5 kg BSA:        2.63 m^2 Pt. Status: Room:  1O10R  SONOGRAPHER  Delcie Roch, RDCS, CCT ADMITTING    Prentice Docker, MD ATTENDING    Prentice Docker, MD PERFORMING   [facility], Inpatient ORDERING     Cline Crock R  cc:  ------------------------------------------------------------------- LV EF: 55% -   60%  ------------------------------------------------------------------- Indications:      Atrial fibrillation - 427.31.  ------------------------------------------------------------------- Study Conclusions  - Left ventricle: The cavity size was normal. Wall thickness was increased in a pattern of mild LVH. Systolic function was normal. The estimated ejection fraction was in the range of 55% to 60%. Basal inferior akinesis. Doppler parameters are consistent with abnormal left ventricular relaxation (grade 1 diastolic dysfunction). - Aortic valve: There was no stenosis. - Aorta: Mildly dilated aortic root. Aortic root dimension: 38 mm (ED). - Mitral valve: There was trivial regurgitation. - Left atrium: The atrium was mildly dilated. - Right ventricle: Mildly dilated RV with mild to moderately decreased systolic function. The free wall is hypokinetic compared to the apex. No PE on CTA, would be concerned for RV infarction. - Right atrium: The atrium was mildly dilated. - Pulmonary arteries: No complete TR doppler jet so unable to estimate PA systolic pressure. - Systemic veins: IVC measured 2.4 cm with > 50% respirophasic variation, suggesting RA pressure 8 mmHg.  Impressions:  - Normal LV size with mild LV hypertrophy. EF 55-60%, basal inferior akinesis. The RV was mildly dilated with  mild to moderate systolic dysfunction. Free wall was hypokinetic compared to apex, in absence of PE would consider RV infarction. No significant valvular abnormalities.  ------------------------------------------------------------------- Study data:  No prior study was available for comparison.  Study status:  Routine.  Procedure:  The patient reported no pain pre or post test. Transthoracic echocardiography. Image quality was adequate.  Study completion:  There were no complications. Transthoracic echocardiography.  M-mode, complete 2D, spectral Doppler, and color Doppler.  Birthdate:  Patient birthdate: 1960/04/20.  Age:  Patient is 62 yr old.  Sex:  Gender: male. BMI: 37.7 kg/m^2.  Blood pressure:     141/84  Patient status: Inpatient.  Study date:  Study date: 07/08/2016. Study time: 12:15 PM.  Location:  Echo laboratory.  -------------------------------------------------------------------  ------------------------------------------------------------------- Left ventricle:  The cavity size was normal. Wall thickness was increased in a pattern of mild LVH. Systolic function was normal. The estimated ejection fraction was in the range of 55% to 60%. Basal inferior akinesis. Doppler parameters are consistent with abnormal left ventricular relaxation (grade 1 diastolic dysfunction).  ------------------------------------------------------------------- Aortic valve:   Trileaflet.  Doppler:   There was no stenosis. There was no regurgitation.  ------------------------------------------------------------------- Aorta:  Mildly dilated aortic root.  ------------------------------------------------------------------- Mitral valve:   Normal thickness leaflets .  Doppler:   There was no evidence for stenosis.   There was trivial regurgitation.  ------------------------------------------------------------------- Left atrium:  The atrium was mildly  dilated.  ------------------------------------------------------------------- Right ventricle:  Mildly dilated RV with mild to moderately decreased systolic function. The free wall is hypokinetic compared to the apex. No PE on CTA, would be concerned for RV infarction.  ------------------------------------------------------------------- Pulmonic valve:    Structurally normal valve.   Cusp separation was normal.  Doppler:  Transvalvular velocity was within the normal range. There was no regurgitation.  ------------------------------------------------------------------- Tricuspid valve:   Doppler:  There was trivial regurgitation.  ------------------------------------------------------------------- Pulmonary artery:   No complete TR doppler jet so unable to estimate PA systolic pressure.  ------------------------------------------------------------------- Right atrium:  The atrium was mildly dilated.  ------------------------------------------------------------------- Pericardium:  There was no pericardial  effusion.  ------------------------------------------------------------------- Systemic veins:  IVC measured 2.4 cm with > 50% respirophasic variation, suggesting RA pressure 8 mmHg.  ------------------------------------------------------------------- Post procedure conclusions Ascending Aorta:  - Mildly dilated aortic root.  ------------------------------------------------------------------- Measurements  Left ventricle                         Value        Reference LV ID, ED, PLAX chordal                47.5  mm     43 - 52 LV ID, ES, PLAX chordal                32.6  mm     23 - 38 LV fx shortening, PLAX chordal         31    %      >=29 LV PW thickness, ED                    13.8  mm     ---------- IVS/LV PW ratio, ED                    0.74         <=1.3 Stroke volume, 2D                      91    ml     ---------- Stroke volume/bsa, 2D                  35    ml/m^2  ---------- LV e&', lateral                         9.57  cm/s   ---------- LV E/e&', lateral                       6.44         ---------- LV e&', medial                          9.45  cm/s   ---------- LV E/e&', medial                        6.52         ---------- LV e&', average                         9.51  cm/s   ---------- LV E/e&', average                       6.48         ----------  Ventricular septum                     Value        Reference IVS thickness, ED                      10.2  mm     ----------  LVOT                                   Value        Reference LVOT area  4.52  cm^2   ---------- LVOT peak velocity, S                  93.1  cm/s   ---------- LVOT mean velocity, S                  61.8  cm/s   ---------- LVOT VTI, S                            20.2  cm     ----------  Aorta                                  Value        Reference Aortic root ID, ED                     33    mm     ----------  Left atrium                            Value        Reference LA ID, A-P, ES                         43    mm     ---------- LA ID/bsa, A-P                         1.63  cm/m^2 <=2.2 LA volume, S                           68.1  ml     ---------- LA volume/bsa, S                       25.9  ml/m^2 ---------- LA volume, ES, 1-p A4C                 71.8  ml     ---------- LA volume/bsa, ES, 1-p A4C             27.3  ml/m^2 ---------- LA volume, ES, 1-p A2C                 65.1  ml     ---------- LA volume/bsa, ES, 1-p A2C             24.7  ml/m^2 ----------  Mitral valve                           Value        Reference Mitral E-wave peak velocity            61.6  cm/s   ---------- Mitral A-wave peak velocity            74.2  cm/s   ---------- Mitral deceleration time               180   ms     150 - 230 Mitral E/A ratio, peak                 0.8          ----------  Right atrium  Value        Reference RA ID, S-I,  ES, A4C            (H)     58.9  mm     34 - 49 RA area, ES, A4C               (H)     22.3  cm^2   8.3 - 19.5 RA volume, ES, A/L                     70.6  ml     ---------- RA volume/bsa, ES, A/L                 26.8  ml/m^2 ----------  Systemic veins                         Value        Reference Estimated CVP                          3     mm Hg  ----------  Right ventricle                        Value        Reference RV s&', lateral, S                      11.6  cm/s   ----------  Legend: (L)  and  (H)  mark values outside specified reference range.  ------------------------------------------------------------------- Prepared and Electronically Authenticated by  Marca Ancona, M.D. 2018-04-28T15:25:25              EKG:  EKG is ordered today.  The ekg ordered today demonstrates NSR with sinus arrhythmia. QT/Qtc 440/457  Recent Labs: No results found for requested labs within last 365 days.  Recent Lipid Panel    Component Value Date/Time   CHOL 135 10/09/2018 0958   TRIG 217 (H) 10/09/2018 0958   HDL 28 (L) 10/09/2018 0958   CHOLHDL 4.8 10/09/2018 0958   CHOLHDL 8.3 07/06/2016 0307   VLDL 63 (H) 07/06/2016 0307   LDLCALC 64 10/09/2018 0958     Risk Assessment/Calculations:    CHA2DS2-VASc Score = 2  This indicates a 2.2% annual risk of stroke. The patient's score is based upon: CHF History: 0 HTN History: 1 Diabetes History: 0 Stroke History: 0 Vascular Disease History: 1 Age Score: 0 Gender Score: 0             Physical Exam:    VS:  BP 122/86   Pulse 65   Ht 6\' 1"  (1.854 m)   Wt 259 lb 6.4 oz (117.7 kg)   SpO2 96%   BMI 34.22 kg/m     Wt Readings from Last 3 Encounters:  08/08/22 259 lb 6.4 oz (117.7 kg)  06/05/22 280 lb 12.8 oz (127.4 kg)  04/12/22 300 lb (136.1 kg)     GEN:  Well nourished, well developed in no acute distress. Sitting on the side of the exam table  HEENT: Normal NECK: No JVD; No carotid bruits CARDIAC: RRR, no  murmurs, rubs, gallops. Radial pulses 2+ bilaterally  RESPIRATORY:  Clear to auscultation without rales, wheezing or rhonchi. Normal work of breathing on room air  ABDOMEN: Soft, non-tender, non-distended MUSCULOSKELETAL:  No edema in BLE; No deformity  SKIN: Warm and  dry NEUROLOGIC:  Alert and oriented x 3 PSYCHIATRIC:  Normal affect   ASSESSMENT:    1. Hyperlipidemia, unspecified hyperlipidemia type   2. PAF (paroxysmal atrial fibrillation) (HCC)    PLAN:    In order of problems listed above:  Paroxysmal Atrial fibrillation  - Patient is maintaining NSR today in office. Denies episodes of palpitations, sob, chest pain.  - Continue sotalol 120 mg BID, metoprolol succinate 100 mg daily  - EKG today showed NSR with sinus arrhythmia. QT/Qtc 440/457 - Continue xarelto 20 mg daily. He denies bleeding issues on xarelto. Hemoglobin was 15 in 10/2021 - Xarelto is very expensive for patient. Sometimes up to $700 a month. I provided samples. Messaged clinical pharmacist for assistance managing cost   CAD  - Patient previously had thrombectomy, PTCA and DES to the proximal RCA in 06/2016 - He has been chest pain free. No chest pain or DOE  - Continue metoprolol succinate 100 mg daily  - Continue lipitor 80 mg daily  - Not on ASA due to use of xarelto   HLD  - Lipid panel from 10/2021 showed LDL 59, HDL 29, triglycerides 263, total cholesterol 130  - Ordered lipid panel and CMP today . Patient has been fasting - Continue lipitor 80 mg daily  - Of note, patient has made healthy changes to his diet and has been able to lose a lot of weigh. At one point, he was nearly 300 lbs. Now, in the 250s. Encouraged these lifestyle modifications   HTN  - BP has been well controlled recently.  Of note, he was seen by his PCP a week ago for low BP and his amlodipine was stopped  - Now, BP 122/86 in office. Agree with stopping amlodipine  - Continue lisinopril 10 mg daily, toprol-XL 100 mg daily for now -  As above, with his lifestyle changes and weight loss, possible he will be able to further cut back on BP meds in the future  - Ordered CMP to monitor renal function and electrolytes    Medication Adjustments/Labs and Tests Ordered: Current medicines are reviewed at length with the patient today.  Concerns regarding medicines are outlined above.  Orders Placed This Encounter  Procedures   Comprehensive metabolic panel   Lipid panel   EKG 12-Lead   Meds ordered this encounter  Medications   rivaroxaban (XARELTO) 20 MG TABS tablet    Sig: Take 1 tablet (20 mg total) by mouth daily with supper.    Dispense:  30 tablet    Refill:  0    Order Specific Question:   Lot Number?    Answer:   [id number]    Order Specific Question:   Expiration Date?    Answer:   12/12/2023    Patient Instructions  Medication Instructions:  YOU ARE NO LONGER TO TAKE AMLODIPINE MEDICATION.  GIVEN SAMPLE OF XARELTO 20 MG  *If you need a refill on your cardiac medications before your next appointment, please call your pharmacy*  Lab Work: CMP, LIPIDS  If you have labs (blood work) drawn today and your tests are completely normal, you will receive your results only by: MyChart Message (if you have MyChart) OR A paper copy in the mail If you have any lab test that is abnormal or we need to change your treatment, we will call you to review the results.  Follow-Up: At [facility], you and your health needs are our priority.  As part of our continuing mission to  provide you with exceptional heart care, we have created designated Provider Care Teams.  These Care Teams include your primary Cardiologist (physician) and Advanced Practice Providers (APPs -  Physician Assistants and Nurse Practitioners) who all work together to provide you with the care you need, when you need it.  We recommend signing up for the patient portal called "MyChart".  Sign up information is provided on this After Visit Summary.   MyChart is used to connect with patients for Virtual Visits (Telemedicine).  Patients are able to view lab/test results, encounter notes, upcoming appointments, etc.  Non-urgent messages can be sent to your provider as well.   To learn more about what you can do with MyChart, go to [email or web address].    Your next appointment:   1 year(s)  Provider:   Peter Swaziland, MD       Signed, Jonita Albee, PA-C  08/08/2022 5:13 PM    [facility]

## 2022-08-08 NOTE — Telephone Encounter (Signed)
Patient came in with an appointment reminder card that had him as having an appt today at 9:30 with Tobi Bastos.  He wasn't on the schedule except for 11:15 with [facility] for today but that appt had been canelled.  I am not sure what happened with his appointment and I explained that to him.... I see what I see but no explanation as to why the change other than when we moved patients off your schedule so you could be at the hospital but doesn't explain why he was with [facility] and then cancelled and we would have called him to let him know about the change but he new nothing.  He said he was supposed to see you because you had put him on a "handful" of medicines and was going to talk about taking him off them before he went to see his heart dr and now doesn't know what to do because he is scheduled to see the heart dr this afternoon at 4:00.  He asked that I send you message explaining all this and what is he to do at this point?

## 2022-08-08 NOTE — Telephone Encounter (Signed)
noted 

## 2022-08-09 ENCOUNTER — Encounter: Payer: Self-pay | Admitting: *Deleted

## 2022-08-09 ENCOUNTER — Telehealth: Payer: Self-pay

## 2022-08-09 LAB — COMPREHENSIVE METABOLIC PANEL
ALT: 32 IU/L (ref 0–44)
AST: 24 IU/L (ref 0–40)
Albumin/Globulin Ratio: 1.9 (ref 1.2–2.2)
Albumin: 4.3 g/dL (ref 3.8–4.9)
Alkaline Phosphatase: 73 IU/L (ref 44–121)
BUN/Creatinine Ratio: 10 (ref 10–24)
BUN: 9 mg/dL (ref 8–27)
Bilirubin Total: 0.8 mg/dL (ref 0.0–1.2)
CO2: 21 mmol/L (ref 20–29)
Calcium: 9.2 mg/dL (ref 8.6–10.2)
Chloride: 101 mmol/L (ref 96–106)
Creatinine, Ser: 0.91 mg/dL (ref 0.76–1.27)
Globulin, Total: 2.3 g/dL (ref 1.5–4.5)
Glucose: 108 mg/dL — ABNORMAL HIGH (ref 70–99)
Potassium: 4 mmol/L (ref 3.5–5.2)
Sodium: 140 mmol/L (ref 134–144)
Total Protein: 6.6 g/dL (ref 6.0–8.5)
eGFR: 96 mL/min/{1.73_m2} (ref >59)

## 2022-08-09 NOTE — Telephone Encounter (Signed)
-----   Message from Jonita Albee, [place] sent at 08/09/2022 12:24 PM EDT ----- Please tell patient that his kidney function, liver function, and electrolytes are all within normal limits. He should continue his current medications and follow up as arranged.   Also, please pass along information for Xarelto with me to help with the cost of Xarelto. [email or web address]  They can also be reached at 1-800-XARELTO   Thanks! KJ

## 2022-08-09 NOTE — Telephone Encounter (Signed)
Patient aware of labs result and provider recommendations. He was also given information of xarelto to help with cost.

## 2022-08-11 ENCOUNTER — Other Ambulatory Visit: Payer: Self-pay | Admitting: Gastroenterology

## 2022-08-11 DIAGNOSIS — K52832 Lymphocytic colitis: Secondary | ICD-10-CM

## 2022-08-11 MED ORDER — BUDESONIDE 3 MG PO CPEP: ORAL_CAPSULE | ORAL | 0 refills | Status: DC

## 2022-08-13 ENCOUNTER — Other Ambulatory Visit: Payer: Self-pay | Admitting: Gastroenterology

## 2022-08-13 DIAGNOSIS — K52832 Lymphocytic colitis: Secondary | ICD-10-CM

## 2022-08-13 MED ORDER — BUDESONIDE 3 MG PO CPEP: ORAL_CAPSULE | ORAL | 0 refills | Status: DC

## 2022-08-23 ENCOUNTER — Telehealth: Payer: Self-pay

## 2022-08-23 NOTE — Telephone Encounter (Signed)
Received a fax from [facility] requesting appointment for patient.Called patient left message on personal voice mail to call back to schedule appointment.

## 2022-08-24 ENCOUNTER — Telehealth: Payer: Self-pay | Admitting: Cardiology

## 2022-08-24 NOTE — Telephone Encounter (Signed)
Patient is calling back again regarding LPN's call yesterday for an appointment. He states he spoke to [facility] and they advised him they have no record of faxing anything to our office regarding him. He is requesting our office call [facility] to clear this up and he be called back with an update. He reports he spoke with a Caitlin at their office. Please advise.

## 2022-08-24 NOTE — Telephone Encounter (Signed)
Spoke to patient advised I did receive a fax from [facility] requesting appointment date and time to be faxed.Advised he just saw PA 08/08/22.I faxed that office note to them at fax # [phone].Advised he needs to follow up with Dr.Jordan in 1 year.Advised to call in February to schedule a May appointment.

## 2022-08-24 NOTE — Telephone Encounter (Addendum)
Patient returned LPN's call regarding scheduling an appt per [facility] fax request. Patient states he is not in need of an appt since just being seen in May and will notify his PCP of him already having an appointment.

## 2022-10-13 DIAGNOSIS — I10 Essential (primary) hypertension: Secondary | ICD-10-CM | POA: Diagnosis not present

## 2022-10-13 DIAGNOSIS — I480 Paroxysmal atrial fibrillation: Secondary | ICD-10-CM | POA: Diagnosis not present

## 2022-11-14 DIAGNOSIS — I480 Paroxysmal atrial fibrillation: Secondary | ICD-10-CM | POA: Diagnosis not present

## 2022-11-14 DIAGNOSIS — I10 Essential (primary) hypertension: Secondary | ICD-10-CM | POA: Diagnosis not present

## 2022-11-14 DIAGNOSIS — Z9229 Personal history of other drug therapy: Secondary | ICD-10-CM | POA: Diagnosis not present

## 2022-11-14 DIAGNOSIS — E7849 Other hyperlipidemia: Secondary | ICD-10-CM | POA: Diagnosis not present

## 2022-11-14 DIAGNOSIS — T50905A Adverse effect of unspecified drugs, medicaments and biological substances, initial encounter: Secondary | ICD-10-CM | POA: Diagnosis not present

## 2022-11-14 DIAGNOSIS — Z0001 Encounter for general adult medical examination with abnormal findings: Secondary | ICD-10-CM | POA: Diagnosis not present

## 2022-11-14 DIAGNOSIS — I951 Orthostatic hypotension: Secondary | ICD-10-CM | POA: Diagnosis not present

## 2022-12-27 DIAGNOSIS — Z9229 Personal history of other drug therapy: Secondary | ICD-10-CM | POA: Diagnosis not present

## 2022-12-27 DIAGNOSIS — L723 Sebaceous cyst: Secondary | ICD-10-CM | POA: Diagnosis not present

## 2022-12-27 DIAGNOSIS — M109 Gout, unspecified: Secondary | ICD-10-CM | POA: Diagnosis not present

## 2022-12-27 DIAGNOSIS — E782 Mixed hyperlipidemia: Secondary | ICD-10-CM | POA: Diagnosis not present

## 2022-12-27 DIAGNOSIS — I10 Essential (primary) hypertension: Secondary | ICD-10-CM | POA: Diagnosis not present

## 2022-12-27 DIAGNOSIS — I480 Paroxysmal atrial fibrillation: Secondary | ICD-10-CM | POA: Diagnosis not present

## 2022-12-27 DIAGNOSIS — L570 Actinic keratosis: Secondary | ICD-10-CM | POA: Diagnosis not present

## 2022-12-27 DIAGNOSIS — Z0001 Encounter for general adult medical examination with abnormal findings: Secondary | ICD-10-CM | POA: Diagnosis not present

## 2023-01-17 DIAGNOSIS — L720 Epidermal cyst: Secondary | ICD-10-CM | POA: Diagnosis not present

## 2023-01-17 DIAGNOSIS — L570 Actinic keratosis: Secondary | ICD-10-CM | POA: Diagnosis not present

## 2023-01-17 DIAGNOSIS — L308 Other specified dermatitis: Secondary | ICD-10-CM | POA: Diagnosis not present

## 2023-01-17 DIAGNOSIS — X32XXXA Exposure to sunlight, initial encounter: Secondary | ICD-10-CM | POA: Diagnosis not present

## 2023-04-09 ENCOUNTER — Telehealth: Payer: Self-pay | Admitting: Cardiology

## 2023-04-09 MED ORDER — LISINOPRIL 10 MG PO TABS: 10.0000 mg | ORAL_TABLET | Freq: Every day | ORAL | 1 refills | Status: AC

## 2023-04-09 NOTE — Telephone Encounter (Signed)
*  STAT* If patient is at the pharmacy, call can be transferred to refill team.   1. Which medications need to be refilled? (please list name of each medication and dose if known) lisinopril (PRINIVIL,ZESTRIL) 10 MG tablet   2. Which pharmacy/location (including street and city if local pharmacy) is medication to be sent to? [facility] - [place], [place] - [address]   3. Do they need a 30 day or 90 day supply? 90

## 2023-05-18 ENCOUNTER — Other Ambulatory Visit: Payer: Self-pay | Admitting: Gastroenterology

## 2023-06-01 DIAGNOSIS — I10 Essential (primary) hypertension: Secondary | ICD-10-CM | POA: Diagnosis not present

## 2023-06-01 DIAGNOSIS — J329 Chronic sinusitis, unspecified: Secondary | ICD-10-CM | POA: Diagnosis not present

## 2023-06-01 DIAGNOSIS — I480 Paroxysmal atrial fibrillation: Secondary | ICD-10-CM | POA: Diagnosis not present

## 2023-06-04 ENCOUNTER — Other Ambulatory Visit (HOSPITAL_COMMUNITY): Payer: Self-pay | Admitting: Internal Medicine

## 2023-06-04 DIAGNOSIS — J329 Chronic sinusitis, unspecified: Secondary | ICD-10-CM

## 2023-06-07 ENCOUNTER — Ambulatory Visit (INDEPENDENT_AMBULATORY_CARE_PROVIDER_SITE_OTHER): Admitting: Otolaryngology

## 2023-06-07 ENCOUNTER — Encounter (INDEPENDENT_AMBULATORY_CARE_PROVIDER_SITE_OTHER): Payer: Self-pay

## 2023-06-07 VITALS — BP 163/92 | HR 53 | Ht 73.0 in | Wt 235.0 lb

## 2023-06-07 DIAGNOSIS — R0981 Nasal congestion: Secondary | ICD-10-CM

## 2023-06-07 DIAGNOSIS — J343 Hypertrophy of nasal turbinates: Secondary | ICD-10-CM

## 2023-06-07 DIAGNOSIS — J342 Deviated nasal septum: Secondary | ICD-10-CM | POA: Diagnosis not present

## 2023-06-07 DIAGNOSIS — J329 Chronic sinusitis, unspecified: Secondary | ICD-10-CM

## 2023-06-07 DIAGNOSIS — J324 Chronic pansinusitis: Secondary | ICD-10-CM

## 2023-06-07 MED ORDER — FLUTICASONE PROPIONATE 50 MCG/ACT NA SUSP: 2.0000 | Freq: Every day | NASAL | 10 refills | Status: AC

## 2023-06-07 NOTE — Progress Notes (Unsigned)
 Patient ID: RYMAN RATHGEBER, male   DOB: Apr 13, 1960, 63 y.o.   MRN: [id number]  CC: Chronic rhinosinusitis, chronic nasal congestion  HPI:  DAMONTE FRIESON is a 63 y.o. male who presents today complaining of chronic sinusitis and nasal congestion for the past year.  He complains of chronic nasal drainage, frequent facial pressure, and chronic nasal congestion.  He was treated with multiple courses of antibiotics and steroids.  He is currently on an oral antibiotic and prednisone.  He has a history of environmental allergies.  He uses Benadryl as needed.  He has no previous ENT surgery.  He is scheduled to undergo sinus CT scan on June 20, 2023.  Past Medical History:  Diagnosis Date   Anxiety    Asthma    Coronary artery disease    a. 06/2016: NSTEMI s/p DES to RCA   Diverticulosis    Dressler's syndrome (HCC)    Dysrhythmia    Ectopic atrial rhythm    HLD (hyperlipidemia)    HTN (hypertension)    Myocardial infarction (HCC)    2017   PAF (paroxysmal atrial fibrillation) (HCC)    a. 06/2016: Dx'd and started on Sotolol and Xarelto    Past Surgical History:  Procedure Laterality Date   CHONDROPLASTY Right 05/10/2012   Procedure: CHONDROPLASTY;  Surgeon: Vickki Hearing, MD;  Location: [facility];  Service: Orthopedics;  Laterality: Right;  of patella   COLONOSCOPY     COLONOSCOPY WITH PROPOFOL N/A 04/17/2022   Procedure: COLONOSCOPY WITH PROPOFOL;  Surgeon: Lanelle Bal, DO;  Location: [facility];  Service: Endoscopy;  Laterality: N/A;  730am, asa 3   CORONARY STENT INTERVENTION N/A 07/06/2016   Procedure: Coronary Stent Intervention;  Surgeon: Lennette Bihari, MD;  Location: [facility] INVASIVE CV LAB;  Service: Cardiovascular;  Laterality: N/A;   ESOPHAGOGASTRODUODENOSCOPY     EXAM UNDER ANESTHESIA WITH MANIPULATION OF KNEE Right 05/10/2012   Procedure: EXAM UNDER ANESTHESIA WITH MANIPULATION OF KNEE;  Surgeon: Vickki Hearing, MD;  Location: [facility];  Service: Orthopedics;   Laterality: Right;  start 0750 end 751   KNEE ARTHROSCOPY WITH EXCISION PLICA Right 05/10/2012   Procedure: KNEE ARTHROSCOPY WITH EXCISION PLICA;  Surgeon: Vickki Hearing, MD;  Location: [facility];  Service: Orthopedics;  Laterality: Right;   KNEE ARTHROSCOPY WITH MEDIAL MENISECTOMY Right 05/10/2012   Procedure: KNEE ARTHROSCOPY WITH MEDIAL MENISECTOMY;  Surgeon: Vickki Hearing, MD;  Location: [facility];  Service: Orthopedics;  Laterality: Right;   LEFT HEART CATH AND CORONARY ANGIOGRAPHY N/A 07/06/2016   Procedure: Left Heart Cath and Coronary Angiography;  Surgeon: Lennette Bihari, MD;  Location: [facility] INVASIVE CV LAB;  Service: Cardiovascular;  Laterality: N/A;   POLYPECTOMY  04/17/2022   Procedure: POLYPECTOMY;  Surgeon: Lanelle Bal, DO;  Location: [facility];  Service: Endoscopy;;    Family History  Problem Relation Age of Onset   Atrial fibrillation Mother    COPD Father    Mesothelioma Father    Diabetes Father    Cholecystitis Sister     Social History:  reports that he has never smoked. He has never used smokeless tobacco. He reports current alcohol use. He reports that he does not use drugs.  Allergies: No Known Allergies  Prior to Admission medications   Medication Sig Start Date End Date Taking? Authorizing Provider  albuterol (PROVENTIL HFA;VENTOLIN HFA) 108 (90 Base) MCG/ACT inhaler Inhale 1-2 puffs into the lungs every 6 (six) hours as needed for wheezing  or shortness of breath.   Yes [provider]  allopurinol (ZYLOPRIM) 300 MG tablet Take 300 mg by mouth daily.   Yes [provider]  ALPRAZolam Prudy Feeler) 1 MG tablet Take 1 mg by mouth 3 (three) times daily as needed for anxiety or sleep.    Yes [provider]  atorvastatin (LIPITOR) 80 MG tablet TAKE 1 TABLET BY MOUTH DAILY AT 6PM 07/17/19  Yes [place], Peter M, MD  budesonide (ENTOCORT EC) 3 MG 24 hr capsule Take 1 tablet daily (3 mg) for 2 weeks, then one tablet (3 mg) every other day for 1  week then stop. 08/13/22  Yes Mahon, Frederik Schmidt, NP  Cholecalciferol (VITAMIN D-3) 125 MCG (5000 UT) TABS Take 5,000 Units by mouth in the morning and at bedtime.   Yes [provider]  clobetasol (TEMOVATE) 0.05 % external solution Apply 1 Application topically daily as needed (psoriasis). 03/31/22  Yes [provider]  clopidogrel (PLAVIX) 75 MG tablet Take 75 mg by mouth daily. 06/08/22  Yes [provider]  lisinopril (ZESTRIL) 10 MG tablet Take 1 tablet (10 mg total) by mouth daily. 04/09/23  Yes [place], Peter M, MD  loperamide (IMODIUM) 2 MG capsule Take 1 mg by mouth as needed for diarrhea or loose stools.   Yes [provider]  meclizine (ANTIVERT) 25 MG tablet Take 25 mg by mouth every 6 (six) hours as needed for dizziness. 08/03/22  Yes [provider]  MELATONIN PO Take 1 tablet by mouth at bedtime.   Yes [provider]  metoprolol succinate (TOPROL-XL) 100 MG 24 hr tablet TAKE ONE TABLET BY MOUTH ONCE DAILY IN THE EVENING TAKE WITH OR IMMEDIATELY FOLLOWING A MEAL 02/04/19  Yes [place], Peter M, MD  Omega-3 Fatty Acids (SALMON OIL-1000 PO) Take 1,000 mg by mouth in the morning and at bedtime.   Yes [provider]  omeprazole (PRILOSEC) 20 MG capsule TAKE 1 CAPSULE (20 MG TOTAL) BY MOUTH DAILY. 30 MINUTES BEFORE BREAKFAST. FOR REFLUX. 05/18/23  Yes Mahon, Frederik Schmidt, NP  rivaroxaban (XARELTO) 20 MG TABS tablet Take 1 tablet (20 mg total) by mouth daily with supper. 08/08/22  Yes Jonita Albee, PA-C  sotalol (BETAPACE) 120 MG tablet Take 1 tablet (120 mg total) by mouth 2 (two) times daily. 12/02/18  Yes [place], Peter M, MD  tamsulosin (FLOMAX) 0.4 MG CAPS capsule Take 0.4 mg by mouth daily.   Yes [provider]  XARELTO 20 MG TABS tablet TAKE 1 TABLET (20 MG TOTAL) BY MOUTH DAILY WITH SUPPER. 12/03/18  Yes Duke, Roe Rutherford, PA    Blood pressure (!) 163/92, pulse (!) 53, height 6\' 1"  (1.854 m), weight 235 lb (106.6 kg),  SpO2 94%. Exam: General: Communicates without difficulty, well nourished, no acute distress. Head: Normocephalic, no evidence injury, no tenderness, facial buttresses intact without stepoff. Face/sinus: No tenderness to palpation and percussion. Facial movement is normal and symmetric. Eyes: PERRL, EOMI. No scleral icterus, conjunctivae clear. Neuro: CN II exam reveals vision grossly intact.  No nystagmus at any point of gaze. Ears: Auricles well formed without lesions.  Ear canals are intact without mass or lesion.  No erythema or edema is appreciated.  The TMs are intact without fluid. Nose: External evaluation reveals normal support and skin without lesions.  Dorsum is intact.  Anterior rhinoscopy reveals congested mucosa over anterior aspect of inferior turbinates and intact septum.  No purulence noted. Oral:  Oral cavity and oropharynx are intact, symmetric, without  erythema or edema.  Mucosa is moist without lesions. Neck: Full range of motion without pain.  There is no significant lymphadenopathy.  No masses palpable.  Thyroid bed within normal limits to palpation.  Parotid glands and submandibular glands equal bilaterally without mass.  Trachea is midline. Neuro:  CN 2-12 grossly intact.   Procedure:  Flexible Nasal Endoscopy: Description: Risks, benefits, and alternatives of flexible endoscopy were explained to the patient.  Specific mention was made of the risk of throat numbness with difficulty swallowing, possible bleeding from the nose and mouth, and pain from the procedure.  The patient gave oral consent to proceed.  The flexible scope was inserted into the right nasal cavity.  Endoscopy of the interior nasal cavity, superior, inferior, and middle meatus was performed. The sphenoid-ethmoid recess was examined. Edematous mucosa was noted.  No polyp, mass, or lesion was appreciated. Nasal septal deviation noted. Olfactory cleft was clear.  Nasopharynx was clear.  Turbinates were hypertrophied but  without mass.  The procedure was repeated on the contralateral side with similar findings.  The patient tolerated the procedure well.   Assessment: 1.  Chronic rhinosinusitis, with nasal mucosal congestion, nasal septal deviation, and bilateral inferior turbinate hypertrophy. 2.  No suspicious mass or lesion is noted today.  Plan: 1.  The physical exam and nasal endoscopy findings are reviewed with the patient. 2.  The patient should complete his current course of antibiotic and prednisone. 3.  Flonase nasal spray 2 sprays each nostril daily.  The importance of consistent daily use is discussed. 4.  The patient will return for reevaluation in 1 month.  Unknown Flannigan W Jaree Dwight 06/07/2023, 1:29 PM

## 2023-06-08 DIAGNOSIS — J324 Chronic pansinusitis: Secondary | ICD-10-CM | POA: Insufficient documentation

## 2023-06-08 DIAGNOSIS — J342 Deviated nasal septum: Secondary | ICD-10-CM | POA: Insufficient documentation

## 2023-06-08 DIAGNOSIS — J343 Hypertrophy of nasal turbinates: Secondary | ICD-10-CM | POA: Insufficient documentation

## 2023-06-20 ENCOUNTER — Ambulatory Visit (HOSPITAL_COMMUNITY)
Admission: RE | Admit: 2023-06-20 | Discharge: 2023-06-20 | Disposition: A | Discharge status: Home / Self Care | Source: Ambulatory Visit | Attending: Internal Medicine | Admitting: Internal Medicine

## 2023-06-20 DIAGNOSIS — J329 Chronic sinusitis, unspecified: Secondary | ICD-10-CM | POA: Diagnosis not present

## 2023-06-20 DIAGNOSIS — J320 Chronic maxillary sinusitis: Secondary | ICD-10-CM | POA: Diagnosis not present

## 2023-06-20 DIAGNOSIS — J342 Deviated nasal septum: Secondary | ICD-10-CM | POA: Diagnosis not present

## 2023-07-04 ENCOUNTER — Telehealth: Payer: Self-pay

## 2023-07-04 NOTE — Telephone Encounter (Signed)
 Mylinda Asa pt called needing an appt to be seen for diarrhea. Pt hasn't been seen in over a year. He requested Antony Baumgartner but seen [facility] last.

## 2023-07-10 NOTE — Progress Notes (Signed)
 GI Office Note    Referring Provider: [facility] Primary Care Physician:  [facility] Primary Gastroenterologist: Rolando Cliche. Mordechai April, DO  Date:  07/16/2023  ID:  Chad Ellis, DOB [birth year], MRN [id number]  Chief Complaint   Chief Complaint  Patient presents with   Follow-up    Pt is better with medication   History of Present Illness  Chad Ellis is a 63 y.o. male with a history of paroxysmal A-fib, Dressler syndrome, HLD, HTN, anxiety, and lymphocytic colitis previously treated with budesonide  presenting today with complaint of diarrhea.  EGD and Colonoscopy at age 10. Had it done at [facility]. Reports he had diverticulosis and not sure about polyps.    OV 02/23/22. Chronic intermittent loose stools and urgency. Normal BM in the morning followed by looser stool before leaving the house. Has urgency and sometimes has to stop multiple places on trips to have a BM. Gas/bloating present. No weight loss or lack of appetite. Tried philips colon health in the past and noted improvement. Advised to complete trial of Xifaxin but unable to afford.    Colonoscopy 04/17/22: -sigmoid and descending diverticulosis -2 (4-77mm) polyps in the rectum and rectosigmoid.  -Rectal polyp with tubular adenoma and evidence of lymphocytic colitis -Sent in budesonide  for patient to take 9 mg daily for 8 weeks and then taper.  -Repeat in 5 years.   Last office visit 06/05/22.  Having 1-2 bowel movements a day versus the 4-5 that he was having previously.  Still having some incomplete emptying or multiple trips in the morning.  Improvement in gassiness.  Eating more vegetables.  Denies any BRBPR or lack of appetite.  Not taking omeprazole  daily, usually taking every other day which is working well.  Having more so dry mouth at night.  Reducing his salt intake previously.  He was advised to continue budesonide  9 mg for 4 weeks and then do a very slow taper.  He was advised  to continue omeprazole  20 mg every other day as needed.  Requested prior colonoscopy and EGD records.  It has a follow-up in 8-10 weeks.   Today:  States budesonide  helped initially but since he has weaned off the symptoms have return. While on medication he has not having issues with gassiness and was having normal BM daily.   Has been using imodium and initially was cutting them in half but currently more so having to take a while tablet daily. Stools this past week have been sticky and thinks potentially greasy.  Has been having symptoms despite what he eats.  Sometimes can go up to 5 times per day.  Sates he avoids coffee in the mornings. Has a normal Bm and then will have watery diarrhea intermittently for a few hours. States this occurred last week. Even now he is still raw in the back. States he is raw from the wiping. States he can not trust when he has gas. Has external and internal itching. His whole butt crack will itch. Has been sing the same toilet paper for years and since he switched toilet papers it has helped some but still having itching.   He states in the past he had issues with hemorrhoid in the past and used some otc cream and that used to be helpful. No overt abdominal pain. Denis any accidents but reports he has been close.    Wt Readings from Last 3 Encounters:  07/16/23 245 lb (111.1 kg)  07/12/23 238 lb (108  kg)  06/07/23 235 lb (106.6 kg)    Current Outpatient Medications  Medication Sig Dispense Refill   albuterol  (PROVENTIL  HFA;VENTOLIN  HFA) 108 (90 Base) MCG/ACT inhaler Inhale 1-2 puffs into the lungs every 6 (six) hours as needed for wheezing or shortness of breath.     allopurinol (ZYLOPRIM) 300 MG tablet Take 100 mg by mouth daily.     ALPRAZolam  (XANAX ) 1 MG tablet Take 1 mg by mouth 3 (three) times daily as needed for anxiety or sleep.      atorvastatin  (LIPITOR ) 80 MG tablet TAKE 1 TABLET BY MOUTH DAILY AT 6PM 90 tablet 2   Cholecalciferol (VITAMIN D-3)  125 MCG (5000 UT) TABS Take 5,000 Units by mouth in the morning and at bedtime.     clobetasol (TEMOVATE) 0.05 % external solution Apply 1 Application topically daily as needed (psoriasis).     clopidogrel  (PLAVIX ) 75 MG tablet Take 75 mg by mouth daily.     lisinopril  (ZESTRIL ) 10 MG tablet Take 1 tablet (10 mg total) by mouth daily. 90 tablet 1   loperamide (IMODIUM) 2 MG capsule Take 1 mg by mouth as needed for diarrhea or loose stools.     MELATONIN PO Take 1 tablet by mouth at bedtime.     metoprolol  succinate (TOPROL -XL) 100 MG 24 hr tablet TAKE ONE TABLET BY MOUTH ONCE DAILY IN THE EVENING TAKE WITH OR IMMEDIATELY FOLLOWING A MEAL 30 tablet 0   Omega-3 Fatty Acids (SALMON OIL-1000 PO) Take 1,000 mg by mouth in the morning and at bedtime.     omeprazole  (PRILOSEC) 20 MG capsule TAKE 1 CAPSULE (20 MG TOTAL) BY MOUTH DAILY. 30 MINUTES BEFORE BREAKFAST. FOR REFLUX. 90 capsule 3   rivaroxaban  (XARELTO ) 20 MG TABS tablet Take 1 tablet (20 mg total) by mouth daily with supper. 30 tablet 0   sotalol  (BETAPACE ) 120 MG tablet Take 1 tablet (120 mg total) by mouth 2 (two) times daily. 180 tablet 3   tamsulosin  (FLOMAX ) 0.4 MG CAPS capsule Take 0.4 mg by mouth daily.     XARELTO  20 MG TABS tablet TAKE 1 TABLET (20 MG TOTAL) BY MOUTH DAILY WITH SUPPER. 90 tablet 1   budesonide  (ENTOCORT EC ) 3 MG 24 hr capsule Take 1 tablet daily (3 mg) for 2 weeks, then one tablet (3 mg) every other day for 1 week then stop. (Patient not taking: Reported on 07/16/2023) 20 capsule 0   fluticasone  (FLONASE ) 50 MCG/ACT nasal spray Place 2 sprays into both nostrils daily. 16 g 10   meclizine (ANTIVERT) 25 MG tablet Take 25 mg by mouth every 6 (six) hours as needed for dizziness. (Patient not taking: Reported on 07/16/2023)     No current facility-administered medications for this visit.    Past Medical History:  Diagnosis Date   Anxiety    Asthma    Coronary artery disease    a. 06/2016: NSTEMI s/p DES to RCA    Diverticulosis    Dressler's syndrome (HCC)    Dysrhythmia    Ectopic atrial rhythm    HLD (hyperlipidemia)    HTN (hypertension)    Myocardial infarction (HCC)    2017   PAF (paroxysmal atrial fibrillation) (HCC)    a. 06/2016: Dx'd and started on Sotolol and Xarelto     Past Surgical History:  Procedure Laterality Date   CHONDROPLASTY Right 05/10/2012   Procedure: CHONDROPLASTY;  Surgeon: Darrin Emerald, MD;  Location: [facility];  Service: Orthopedics;  Laterality: Right;  of patella  COLONOSCOPY     COLONOSCOPY WITH PROPOFOL  N/A 04/17/2022   Procedure: COLONOSCOPY WITH PROPOFOL ;  Surgeon: Vinetta Greening, DO;  Location: [facility];  Service: Endoscopy;  Laterality: N/A;  730am, asa 3   CORONARY STENT INTERVENTION N/A 07/06/2016   Procedure: Coronary Stent Intervention;  Surgeon: Millicent Ally, MD;  Location: [facility];  Service: Cardiovascular;  Laterality: N/A;   ESOPHAGOGASTRODUODENOSCOPY     EXAM UNDER ANESTHESIA WITH MANIPULATION OF KNEE Right 05/10/2012   Procedure: EXAM UNDER ANESTHESIA WITH MANIPULATION OF KNEE;  Surgeon: Darrin Emerald, MD;  Location: [facility];  Service: Orthopedics;  Laterality: Right;  start 0750 end 751   KNEE ARTHROSCOPY WITH EXCISION PLICA Right 05/10/2012   Procedure: KNEE ARTHROSCOPY WITH EXCISION PLICA;  Surgeon: Darrin Emerald, MD;  Location: [facility];  Service: Orthopedics;  Laterality: Right;   KNEE ARTHROSCOPY WITH MEDIAL MENISECTOMY Right 05/10/2012   Procedure: KNEE ARTHROSCOPY WITH MEDIAL MENISECTOMY;  Surgeon: Darrin Emerald, MD;  Location: [facility];  Service: Orthopedics;  Laterality: Right;   LEFT HEART CATH AND CORONARY ANGIOGRAPHY N/A 07/06/2016   Procedure: Left Heart Cath and Coronary Angiography;  Surgeon: Millicent Ally, MD;  Location: [facility] INVASIVE CV LAB;  Service: Cardiovascular;  Laterality: N/A;   POLYPECTOMY  04/17/2022   Procedure: POLYPECTOMY;  Surgeon: Vinetta Greening, DO;  Location: [facility];  Service:  Endoscopy;;    Family History  Problem Relation Age of Onset   Atrial fibrillation Mother    COPD Father    Mesothelioma Father    Diabetes Father    Cholecystitis Sister     Allergies as of 07/16/2023   (No Known Allergies)    Social History   Socioeconomic History   Marital status: Divorced    Spouse name: Not on file   Number of children: Not on file   Years of education: Not on file   Highest education level: Not on file  Occupational History   Not on file  Tobacco Use   Smoking status: Never   Smokeless tobacco: Never  Vaping Use   Vaping status: Never Used  Substance and Sexual Activity   Alcohol use: Yes    Comment: every other day use of brandy or beer   Drug use: No   Sexual activity: Not on file  Other Topics Concern   Not on file  Social History Narrative   Not on file   Social Drivers of Health   Financial Resource Strain: Not on file  Food Insecurity: Not on file  Transportation Needs: Not on file  Physical Activity: Not on file  Stress: Not on file  Social Connections: Not on file     Review of Systems   Gen: Denies fever, chills, anorexia. Denies fatigue, weakness, weight loss.  CV: Denies chest pain, palpitations, syncope, peripheral edema, and claudication. Resp: Denies dyspnea at rest, cough, wheezing, coughing up blood, and pleurisy. GI: See HPI Derm: Denies rash, itching, dry skin Psych: Denies depression, anxiety, memory loss, confusion. No homicidal or suicidal ideation.  Heme: Denies bruising, bleeding, and enlarged lymph nodes.  Physical Exam   BP 117/84   Pulse 78   Temp 97.8 F (36.6 C)   Ht 6\' 1"  (1.854 m)   Wt 245 lb (111.1 kg)   BMI 32.32 kg/m   General:   Alert and oriented. No distress noted. Pleasant and cooperative.  Head:  Normocephalic and atraumatic. Eyes:  Conjuctiva clear without scleral icterus. Mouth:  Oral mucosa pink and moist. Good dentition. No lesions. Rectal: offered - patient declined.  Msk:   Symmetrical without gross deformities. Normal posture. Extremities:  Without edema. Neurologic:  Alert and  oriented x4 Psych:  Alert and cooperative. Normal mood and affect.  Assessment  Chad Ellis is a 63 y.o. male with a history of paroxysmal A-fib, Dressler syndrome, HLD, HTN, anxiety, and lymphocytic colitis previously treated with budesonide  presenting today with complaint of diarrhea..  Diarrhea, history of lymphocytic colitis: Previous symptoms consistent with IBS however on colonoscopy February 2024 he had evidence of lymphocytic colitis and was started on budesonide .  At his last office visit last year we discussed tapering the medication and monitoring for return of symptoms.  He states for last several months he has been having issues with gassiness as well as urgency, has had good relief with the budesonide  in the past.  Given the potential for this to be IBS versus lymphocytic colitis we will trial cholestyramine first.  He denies any recent sick contacts, fevers, or any overt abdominal pain at this time.  Has had improvement with probiotics in the past.  Attempted at prescribing Xifaxan  in the past however was not covered by insurance.  Will send in cholestyramine, counseled on potential for constipation as well as advised on administration instructions including not to take too close to other medications.  He had been taking Imodium with only mild relief of symptoms and advised to not take while taking cholestyramine.  If no improvement may try trial of budesonide .  Low suspicion for C. difficile however if no improvement with cholestyramine we can do stool studies to check before trial of budesonide .  Advised some over-the-counter hydrocortisone for some rectal discomfort and potential hemorrhoids.  Likely has some dermatitis secondary to frequent hygiene.  GERD: Well-controlled with omeprazole  every 2-3 days as needed.  Denies any nausea, vomiting, dysphagia.  PLAN     Cholestyramine 4g once daily Continue omeprazole  20 mg daily as needed.  If cholestyramine not helpful will do longer trial of budesonide .  Will perform stool study prior to initiating budesonide .  Hold imodium while taking cholestyramine.  Limit dairy for now Could consider resumption of probiotic. Rectal hydrocortisone as needed.  Follow up in 6 weeks.   Julian Obey, MSN, FNP-BC, AGACNP-BC [facility]

## 2023-07-12 ENCOUNTER — Encounter (INDEPENDENT_AMBULATORY_CARE_PROVIDER_SITE_OTHER): Payer: Self-pay

## 2023-07-12 ENCOUNTER — Ambulatory Visit (INDEPENDENT_AMBULATORY_CARE_PROVIDER_SITE_OTHER): Admitting: Otolaryngology

## 2023-07-12 VITALS — Ht 73.0 in | Wt 238.0 lb

## 2023-07-12 DIAGNOSIS — J338 Other polyp of sinus: Secondary | ICD-10-CM

## 2023-07-12 DIAGNOSIS — J343 Hypertrophy of nasal turbinates: Secondary | ICD-10-CM | POA: Diagnosis not present

## 2023-07-12 DIAGNOSIS — J322 Chronic ethmoidal sinusitis: Secondary | ICD-10-CM

## 2023-07-12 DIAGNOSIS — J342 Deviated nasal septum: Secondary | ICD-10-CM

## 2023-07-12 DIAGNOSIS — J320 Chronic maxillary sinusitis: Secondary | ICD-10-CM

## 2023-07-12 DIAGNOSIS — J329 Chronic sinusitis, unspecified: Secondary | ICD-10-CM | POA: Diagnosis not present

## 2023-07-12 DIAGNOSIS — J324 Chronic pansinusitis: Secondary | ICD-10-CM

## 2023-07-14 DIAGNOSIS — J338 Other polyp of sinus: Secondary | ICD-10-CM | POA: Insufficient documentation

## 2023-07-14 NOTE — Progress Notes (Signed)
 Patient ID: Chad Ellis, male   DOB: Dec 24, 1960, 63 y.o.   MRN: [id number]  Follow-up: Chronic nasal obstruction, chronic rhinosinusitis  HPI: The patient is a 63 year old male who returns today for his follow-up evaluation.  The patient was last seen in March 2025.  At that time, he was complaining of recurrent sinusitis and chronic nasal obstruction.  He was treated with multiple courses of antibiotics and steroids.  However, he continues to be symptomatic.  At his last visit, he was noted to have nasal mucosal congestion, nasal septal deviation, and bilateral inferior turbinate hypertrophy.  He recently underwent a sinus CT scan.  The CT showed opacification of the left maxillary and ethmoid sinuses.  In addition, the patient also has significant nasal septal deviation, bilateral concha bullosa, and bilateral inferior turbinate hypertrophy.  The patient returns today complaining of persistent symptoms.  He continues to have difficulty breathing through his nose.  He also has frequent facial pressure and pain.  He is interested in more definitive treatment.  Exam: General: Communicates without difficulty, well nourished, no acute distress. Head: Normocephalic, no evidence injury, no tenderness, facial buttresses intact without stepoff. Face/sinus: No tenderness to palpation and percussion. Facial movement is normal and symmetric. Eyes: PERRL, EOMI. No scleral icterus, conjunctivae clear. Neuro: CN II exam reveals vision grossly intact.  No nystagmus at any point of gaze. Ears: Auricles well formed without lesions.  Ear canals are intact without mass or lesion.  No erythema or edema is appreciated.  The TMs are intact without fluid. Nose: External evaluation reveals normal support and skin without lesions.  Dorsum is intact.  Anterior rhinoscopy reveals congested mucosa over anterior aspect of inferior turbinates and deviated septum.   Oral:  Oral cavity and oropharynx are intact, symmetric, without  erythema or edema.  Mucosa is moist without lesions. Neck: Full range of motion without pain.  There is no significant lymphadenopathy.  No masses palpable.  Thyroid  bed within normal limits to palpation.  Parotid glands and submandibular glands equal bilaterally without mass.  Trachea is midline. Neuro:  CN 2-12 grossly intact.   Assessment: 1.  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  The patient has not responded to maximal medical treatment, including multiple antibiotics, systemic and topical steroids, nasal saline irrigation, and allergy medications for the past year. 2.  The patient also has bilateral concha bullosa, nasal septal deviation, and bilateral inferior turbinate hypertrophy.  Plan: 1.  The physical exam findings and the CT images are reviewed with the patient. 2.  Continue with Flonase  nasal spray 2 sprays each nostril daily. 3.  In light of his persistent symptoms, the patient will benefit from surgical intervention with endoscopic sinus surgery, septoplasty, turbinate reduction, and bilateral concha bullosa resection.  The risk, benefits, alternatives, and details of the procedures are extensively reviewed with the patient.  Questions are invited and answered. 4.  The patient would like to proceed with the procedures. 5.  The patient will need to stop his Xarelto  48 hours before the surgery.

## 2023-07-16 ENCOUNTER — Encounter: Payer: Self-pay | Admitting: Gastroenterology

## 2023-07-16 ENCOUNTER — Ambulatory Visit: Admitting: Gastroenterology

## 2023-07-16 VITALS — BP 117/84 | HR 78 | Temp 97.8°F | Ht 73.0 in | Wt 245.0 lb

## 2023-07-16 DIAGNOSIS — Z8719 Personal history of other diseases of the digestive system: Secondary | ICD-10-CM | POA: Diagnosis not present

## 2023-07-16 DIAGNOSIS — K219 Gastro-esophageal reflux disease without esophagitis: Secondary | ICD-10-CM | POA: Diagnosis not present

## 2023-07-16 DIAGNOSIS — R197 Diarrhea, unspecified: Secondary | ICD-10-CM | POA: Diagnosis not present

## 2023-07-16 DIAGNOSIS — K580 Irritable bowel syndrome with diarrhea: Secondary | ICD-10-CM

## 2023-07-16 DIAGNOSIS — K52832 Lymphocytic colitis: Secondary | ICD-10-CM

## 2023-07-16 MED ORDER — CHOLESTYRAMINE 4 GM/DOSE PO POWD: 4.0000 g | Freq: Every day | ORAL | 12 refills | Status: DC

## 2023-07-16 NOTE — Patient Instructions (Addendum)
 I would you start taking cholestyramine 4 g once daily mixed in 4-8 ounces of water.  It is very important that you wait at least 1 hour after taking your morning medications to drink this medication.  Please ensure you start completely prior to drinking.  Please do not let it sit for a prolonged period of time as it may clump.  If you do not have a bowel movement within 24 hours after starting this medication please stop until you begin to have bowel movements again.  If you begin to have constipation or if you go 2 weeks with taking this medication and are not having any improvement in the consistency of your bowel movements then please let me know.  It is very poor and that you do not take Imodium while you are taking this medication.  Continue your omeprazole  (Prilosec) every few days as needed.  For some of the rectal discomfort and your skin around your bottom you may use over-the-counter hemorrhoid cream.  Please ensure that this area stays dry, do not use too much moisture with hygiene.  Follow-up in 6 weeks  It was a pleasure to see you today. I want to create trusting relationships with patients. If you receive a survey regarding your visit,  I greatly appreciate you taking time to fill this out on paper or through your MyChart. I value your feedback.  Julian Obey, MSN, FNP-BC, AGACNP-BC [facility]

## 2023-07-30 ENCOUNTER — Telehealth: Payer: Self-pay | Admitting: Cardiology

## 2023-07-30 NOTE — Telephone Encounter (Signed)
     Pre-operative Risk Assessment    Patient Name: Chad Ellis  DOB: 11-27-1960 MRN: [id number]   Date of last office visit: 08/08/22 Date of next office visit: 07/2023 - not yet schedule   Request for Surgical Clearance    Procedure:  endoscopic sinus surgery septoplasty turbinate reduction and bilateral concha bullosa resection  Date of Surgery:  Clearance TBD                                Surgeon:  Dr. Darlin Ehrlich Surgeon's Group or Practice Name:  [facility] Phone number:  [phone] Fax number:  they will fax clearance and fax number will be there   Type of Clearance Requested:   - Medical  - Pharmacy:  Hold Clopidogrel  (Plavix ) and Rivaroxaban  (Xarelto ) defer to cards   Type of Anesthesia:  CHOICE   Additional requests/questions:  need clearance as soon as possible   Signed, Elizbeth Gum   07/30/2023, 4:29 PM

## 2023-08-01 NOTE — Telephone Encounter (Signed)
 Calling for update on clearance. Please advised

## 2023-08-01 NOTE — Telephone Encounter (Signed)
 Looks like we are waiting on Pharm

## 2023-08-01 NOTE — Telephone Encounter (Signed)
Will forward to pre op APP to review if the pt has been cleared.

## 2023-08-02 NOTE — Telephone Encounter (Addendum)
   Patient Name: Chad Ellis  DOB: May 10, 1960 MRN: [id number]  Primary Cardiologist: Peter [place], MD  Chart reviewed as part of pre-operative protocol coverage. Given past medical history and time since last visit, based on ACC/AHA guidelines, [name] is at acceptable risk for the planned procedure without further cardiovascular testing.   The patient was advised that if he develops new symptoms prior to surgery to contact our office to arrange for a follow-up visit, and he verbalized understanding.  -Patient can hold Plavix  5 days prior to procedure and should restart postprocedure when surgically safe and hemostasis is achieved.  -Patient has not had an Afib/aflutter ablation within the last 3 months or DCCV within the last 30 days   Per office protocol, patient can hold Xarelto   for 2 days prior to procedure.    I will route this recommendation to the requesting party via Epic fax function and remove from pre-op  pool.  Please call with questions.  Francene Ing, Retha Cast, NP 08/02/2023, 12:30 PM

## 2023-08-02 NOTE — Telephone Encounter (Signed)
 Patient with diagnosis of PAF on Xarelto   for anticoagulation.    Procedure: endoscopic sinus surgery septoplasty turbinate reduction and bilateral concha bullosa resection   Date of procedure: TBD   CHA2DS2-VASc Score = 2   This indicates a 2.2% annual risk of stroke. The patient's score is based upon: CHF History: 0 HTN History: 1 Diabetes History: 0 Stroke History: 0 Vascular Disease History: 1 Age Score: 0 Gender Score: 0     CrCl 111 mL/min   Patient has not had an Afib/aflutter ablation within the last 3 months or DCCV within the last 30 days   Per office protocol, patient can hold Xarelto   for 2 days prior to procedure.     **This guidance is not considered finalized until pre-operative APP has relayed final recommendations.**

## 2023-08-03 ENCOUNTER — Ambulatory Visit (INDEPENDENT_AMBULATORY_CARE_PROVIDER_SITE_OTHER): Admitting: Otolaryngology

## 2023-08-14 ENCOUNTER — Encounter (INDEPENDENT_AMBULATORY_CARE_PROVIDER_SITE_OTHER): Payer: Self-pay | Admitting: Otolaryngology

## 2023-09-01 NOTE — Progress Notes (Signed)
 GI Office Note    Referring Provider: [facility] Primary Care Physician:  [facility] Primary Gastroenterologist: Carlin POUR. Cindie, DO  Date:  09/03/2023  ID:  Chad Ellis, DOB 1961-02-22, MRN [id number]   Chief Complaint   Chief Complaint  Patient presents with   Follow-up    Follow up on Lymphocytic colitis    History of Present Illness  LOGIN MUCKLEROY is a 63 y.o. male with a history of paroxysmal A-fib, Dressler's syndrome, HLD, HTN, anxiety, and lymphocytic colitis previously treated with budesonide  presenting today for follow up of diarrhea.   EGD and Colonoscopy at age 58. Had it done at [facility]. Reports he had diverticulosis and not sure about polyps.    OV 02/23/22. Chronic intermittent loose stools and urgency. Normal BM in the morning followed by looser stool before leaving the house. Has urgency and sometimes has to stop multiple places on trips to have a BM. Gas/bloating present. No weight loss or lack of appetite. Tried philips colon health in the past and noted improvement. Advised to complete trial of Xifaxin but unable to afford.    Colonoscopy 04/17/22: -sigmoid and descending diverticulosis -2 (4-37mm) polyps in the rectum and rectosigmoid.  -Rectal polyp with tubular adenoma and evidence of lymphocytic colitis -Sent in budesonide  for patient to take 9 mg daily for 8 weeks and then taper.  -Repeat in 5 years.    Last office visit 06/05/22.  Having 1-2 bowel movements a day versus the 4-5 that he was having previously.  Still having some incomplete emptying or multiple trips in the morning.  Improvement in gassiness.  Eating more vegetables.  Denies any BRBPR or lack of appetite.  Not taking omeprazole  daily, usually taking every other day which is working well.  Having more so dry mouth at night.  Reducing his salt intake previously.  He was advised to continue budesonide  9 mg for 4 weeks and then do a very slow taper.  He  was advised to continue omeprazole  20 mg every other day as needed.  Requested prior colonoscopy and EGD records.  Advised follow-up in 8-10 weeks.  Last office visit 07/16/23.taking imodium daily. Normal BM in the morning followed by a few watery BMs intermittently for a few hours. Raw perineum due to frequent wiping. Concerned that gas may be actual stool. Having rectal itching. Thought toilet tissue was the issue but still occurring even after switching. Advised trial of cholestyramine  and if not helpful then consider longer course of budesonide . Continue omeprazole  20 mg dialy. Limit dairy and consider resumption of probiotic. Use rectal hydrocortisone as needed.   Today:  [place] time with sand fleas. He states the water while on his vacation was not good quality, he felt like the water had a terrible sulfur smell and he states he had tick bites and mosquito bites as well.   Stomach was acting up before his trip and he did refill is cholestyramine  and he never had any issues while down there but when he got back home he started having more issues. He states it doesn't matter what he eats, veggies/meats. Etc and it was completely water. This morning he reports it being half and half. Has mild discomfort while its going but not severe pain. Bottom is raw more from wiping. Has intermittent toilet tissue hematochezia. Stool have been yellow or brown but when its very watery it is always more  yellow. Sometimes still has some mucous but that has not been as bad either. Has not had anymore need for multiple wiping trips.   On average can go 5-6 times a day, sometimes up to 8 times per day.  Sometimes does not have any output and sometimes has a little to a lot of watery diarrhea.  Has not been taking imodium since the cholestyramine . In the past he would take 1/2 dose anti-diarrheal. Does not believe he was ever been constipated. Cheese does not bother him.    GERD controlled.   Wt Readings from Last 3 Encounters:  09/03/23 241 lb 9.6 oz (109.6 kg)  07/16/23 245 lb (111.1 kg)  07/12/23 238 lb (108 kg)    Current Outpatient Medications  Medication Sig Dispense Refill   albuterol  (PROVENTIL  HFA;VENTOLIN  HFA) 108 (90 Base) MCG/ACT inhaler Inhale 1-2 puffs into the lungs every 6 (six) hours as needed for wheezing or shortness of breath.     allopurinol (ZYLOPRIM) 300 MG tablet Take 100 mg by mouth daily.     ALPRAZolam  (XANAX ) 1 MG tablet Take 1 mg by mouth 3 (three) times daily as needed for anxiety or sleep.      atorvastatin  (LIPITOR ) 80 MG tablet TAKE 1 TABLET BY MOUTH DAILY AT 6PM 90 tablet 2   Cholecalciferol (VITAMIN D-3) 125 MCG (5000 UT) TABS Take 5,000 Units by mouth in the morning and at bedtime.     cholestyramine  (QUESTRAN ) 4 GM/DOSE powder Take 1 packet (4 g total) by mouth daily. 378 g 12   clobetasol (TEMOVATE) 0.05 % external solution Apply 1 Application topically daily as needed (psoriasis).     clopidogrel  (PLAVIX ) 75 MG tablet Take 75 mg by mouth daily.     fluticasone  (FLONASE ) 50 MCG/ACT nasal spray Place 2 sprays into both nostrils daily. 16 g 10   lisinopril  (ZESTRIL ) 10 MG tablet Take 1 tablet (10 mg total) by mouth daily. 90 tablet 1   loperamide (IMODIUM) 2 MG capsule Take 1 mg by mouth as needed for diarrhea or loose stools.     meclizine (ANTIVERT) 25 MG tablet Take 25 mg by mouth every 6 (six) hours as needed for dizziness. (Patient not taking: Reported on 07/16/2023)     MELATONIN PO Take 1 tablet by mouth at bedtime.     metoprolol  succinate (TOPROL -XL) 100 MG 24 hr tablet TAKE ONE TABLET BY MOUTH ONCE DAILY IN THE EVENING TAKE WITH OR IMMEDIATELY FOLLOWING A MEAL 30 tablet 0   Omega-3 Fatty Acids (SALMON OIL-1000 PO) Take 1,000 mg by mouth in the morning and at bedtime.     omeprazole  (PRILOSEC) 20 MG capsule TAKE 1 CAPSULE (20 MG TOTAL) BY MOUTH DAILY. 30 MINUTES BEFORE BREAKFAST. FOR REFLUX. 90 capsule 3    rivaroxaban  (XARELTO ) 20 MG TABS tablet Take 1 tablet (20 mg total) by mouth daily with supper. 30 tablet 0   sotalol  (BETAPACE ) 120 MG tablet Take 1 tablet (120 mg total) by mouth 2 (two) times daily. 180 tablet 3   tamsulosin  (FLOMAX ) 0.4 MG CAPS capsule Take 0.4 mg by mouth daily.     XARELTO  20 MG TABS tablet TAKE 1 TABLET (20 MG TOTAL) BY MOUTH DAILY WITH SUPPER. 90 tablet 1   No current facility-administered medications for this visit.    Past Medical History:  Diagnosis Date   Anxiety    Asthma    Coronary artery disease    a. 06/2016: NSTEMI s/p DES to RCA   Diverticulosis  Dressler's syndrome (HCC)    Dysrhythmia    Ectopic atrial rhythm    HLD (hyperlipidemia)    HTN (hypertension)    Myocardial infarction (HCC)    2017   PAF (paroxysmal atrial fibrillation) (HCC)    a. 06/2016: Dx'd and started on Sotolol and Xarelto     Past Surgical History:  Procedure Laterality Date   CHONDROPLASTY Right 05/10/2012   Procedure: CHONDROPLASTY;  Surgeon: Taft FORBES Minerva, MD;  Location: [facility];  Service: Orthopedics;  Laterality: Right;  of patella   COLONOSCOPY     COLONOSCOPY WITH PROPOFOL  N/A 04/17/2022   Procedure: COLONOSCOPY WITH PROPOFOL ;  Surgeon: Cindie Carlin POUR, DO;  Location: [facility];  Service: Endoscopy;  Laterality: N/A;  730am, asa 3   CORONARY STENT INTERVENTION N/A 07/06/2016   Procedure: Coronary Stent Intervention;  Surgeon: Debby DELENA Sor, MD;  Location: [facility] INVASIVE CV LAB;  Service: Cardiovascular;  Laterality: N/A;   ESOPHAGOGASTRODUODENOSCOPY     EXAM UNDER ANESTHESIA WITH MANIPULATION OF KNEE Right 05/10/2012   Procedure: EXAM UNDER ANESTHESIA WITH MANIPULATION OF KNEE;  Surgeon: Taft FORBES Minerva, MD;  Location: [facility];  Service: Orthopedics;  Laterality: Right;  start 0750 end 751   KNEE ARTHROSCOPY WITH EXCISION PLICA Right 05/10/2012   Procedure: KNEE ARTHROSCOPY WITH EXCISION PLICA;  Surgeon: Taft FORBES Minerva, MD;  Location: [facility];  Service:  Orthopedics;  Laterality: Right;   KNEE ARTHROSCOPY WITH MEDIAL MENISECTOMY Right 05/10/2012   Procedure: KNEE ARTHROSCOPY WITH MEDIAL MENISECTOMY;  Surgeon: Taft FORBES Minerva, MD;  Location: [facility];  Service: Orthopedics;  Laterality: Right;   LEFT HEART CATH AND CORONARY ANGIOGRAPHY N/A 07/06/2016   Procedure: Left Heart Cath and Coronary Angiography;  Surgeon: Debby DELENA Sor, MD;  Location: [facility] INVASIVE CV LAB;  Service: Cardiovascular;  Laterality: N/A;   POLYPECTOMY  04/17/2022   Procedure: POLYPECTOMY;  Surgeon: Cindie Carlin POUR, DO;  Location: [facility];  Service: Endoscopy;;    Family History  Problem Relation Age of Onset   Atrial fibrillation Mother    COPD Father    Mesothelioma Father    Diabetes Father    Cholecystitis Sister     Allergies as of 09/03/2023   (No Known Allergies)    Social History   Socioeconomic History   Marital status: Divorced    Spouse name: Not on file   Number of children: Not on file   Years of education: Not on file   Highest education level: Not on file  Occupational History   Not on file  Tobacco Use   Smoking status: Never   Smokeless tobacco: Never  Vaping Use   Vaping status: Never Used  Substance and Sexual Activity   Alcohol use: Yes    Comment: every other day use of brandy or beer   Drug use: No   Sexual activity: Not on file  Other Topics Concern   Not on file  Social History Narrative   Not on file   Social Drivers of Health   Financial Resource Strain: Not on file  Food Insecurity: Not on file  Transportation Needs: Not on file  Physical Activity: Not on file  Stress: Not on file  Social Connections: Not on file     Review of Systems   Gen: Denies fever, chills, anorexia. Denies fatigue, weakness, weight loss.  CV: Denies chest pain, palpitations, syncope, peripheral edema, and claudication. Resp: Denies dyspnea at rest, cough, wheezing, coughing up blood, and pleurisy. GI: See HPI Derm:  Denies rash,  itching, dry skin Psych: Denies depression, anxiety, memory loss, confusion. No homicidal or suicidal ideation.  Heme: Denies bruising, bleeding, and enlarged lymph nodes.  Physical Exam   BP 135/89   Pulse 69   Temp 97.9 F (36.6 C)   Ht 6' 1 (1.854 m)   Wt 241 lb 9.6 oz (109.6 kg)   BMI 31.88 kg/m   General:   Alert and oriented. No distress noted. Pleasant and cooperative.  Head:  Normocephalic and atraumatic. Eyes:  Conjuctiva clear without scleral icterus. Mouth:  Oral mucosa pink and moist. Good dentition. No lesions. Abdomen:  +BS, soft, non-tender and non-distended. No rebound or guarding. No HSM or masses noted. Rectal: Deferred. Msk:  Symmetrical without gross deformities. Normal posture. Extremities:  Without edema. Neurologic:  Alert and  oriented x4 Psych:  Alert and cooperative. Normal mood and affect.  Assessment  BRAIDYN SCORSONE is a 63 y.o. male with a history of paroxysmal A-fib, Dressler's syndrome, HLD, HTN, anxiety, and lymphocytic colitis presenting today for follow up of diarrhea.   Diarrhea, history of lymphocytic colitis:  - Has been taking the cholestyramine  and Imodium.  Used to take a half a dose of antidiarrheal on a daily basis.  Cheeses do not bother him. -No matter what in his diet, he has symptoms and it definitely worsened after returning home. - On average has a bowel movement 5-6 times a day, sometimes 8/day.  Consistency is mushy/watery -Recently has had some change in color of stools being yellow or brown - Also having some urgency/gas feeling as some of his trips to the bathroom he does not have a bowel movement. - Has some associated rectal pain although this is more discomfort and likely moisture associated dermatitis or injury from frequent wiping.  This does cause some intermittent toilet tissue hematochezia. - Given his increased frequency and consistency, suspect this is uncontrolled microscopic colitis therefore will stop  cholestyramine  and resume budesonide  given he has had good improvement with this in the past.  Will do a longer course than previously.  Very low suspicion for infection. - Will continue probiotic and recommended rectal hydrocortisone for possible hemorrhoid issues and zinc-based cream to the perineal area for dermatitis associated with wiping.  GERD: Well-controlled with omeprazole  20 mg once daily.  PLAN   Stop cholestyramine  and start Budesonide  9 mg daily for 3 weeks then 6 mg daily.  Imodium as needed.  Omeprazole  20 mg daily Continue to limit dairy intake Probiotic daily Rectal hydrocortisone as needed. Zinc-based cream to perineal area. Separate written education regarding microscopic colitis given to patient. Follow up in 8 weeks.   Charmaine Melia, MSN, FNP-BC, AGACNP-BC [facility]

## 2023-09-03 ENCOUNTER — Ambulatory Visit: Admitting: Gastroenterology

## 2023-09-03 ENCOUNTER — Encounter: Payer: Self-pay | Admitting: Gastroenterology

## 2023-09-03 VITALS — BP 135/89 | HR 69 | Temp 97.9°F | Ht 73.0 in | Wt 241.6 lb

## 2023-09-03 DIAGNOSIS — K219 Gastro-esophageal reflux disease without esophagitis: Secondary | ICD-10-CM

## 2023-09-03 DIAGNOSIS — K52832 Lymphocytic colitis: Secondary | ICD-10-CM | POA: Diagnosis not present

## 2023-09-03 DIAGNOSIS — K921 Melena: Secondary | ICD-10-CM

## 2023-09-03 DIAGNOSIS — K6289 Other specified diseases of anus and rectum: Secondary | ICD-10-CM

## 2023-09-03 DIAGNOSIS — K580 Irritable bowel syndrome with diarrhea: Secondary | ICD-10-CM

## 2023-09-03 MED ORDER — BUDESONIDE 3 MG PO CPEP: ORAL_CAPSULE | ORAL | 1 refills | Status: AC

## 2023-09-03 NOTE — Patient Instructions (Addendum)
 Stop cholestyramine  powder.  Start budesonide  9 mg (3 tablets) daily for 3 weeks then you will reduce to 6 mg (2 tablets) daily thereafter.  We will see how you are doing in 2 months.  Please continue omeprazole  for your reflux.  I would recommend resuming your probiotic.  Continue hydrocortisone as needed for hemorrhoid flares.  For overall skin around the rectum/bottom you may use Desitin or Boudreau's Butt paste 1-2 times a day to help with skin healing.  Follow-up in 2 months.  It was a pleasure to see you today. I want to create trusting relationships with patients. If you receive a survey regarding your visit,  I greatly appreciate you taking time to fill this out on paper or through your MyChart. I value your feedback.  Charmaine Melia, MSN, FNP-BC, AGACNP-BC [facility]

## 2023-09-07 ENCOUNTER — Emergency Department (HOSPITAL_COMMUNITY)
Admission: EM | Admit: 2023-09-07 | Discharge: 2023-09-07 | Disposition: A | Discharge status: Home / Self Care | Attending: Emergency Medicine | Admitting: Emergency Medicine

## 2023-09-07 ENCOUNTER — Encounter (HOSPITAL_COMMUNITY): Payer: Self-pay | Admitting: Emergency Medicine

## 2023-09-07 ENCOUNTER — Other Ambulatory Visit: Payer: Self-pay

## 2023-09-07 ENCOUNTER — Ambulatory Visit (INDEPENDENT_AMBULATORY_CARE_PROVIDER_SITE_OTHER): Admitting: Otolaryngology

## 2023-09-07 DIAGNOSIS — J343 Hypertrophy of nasal turbinates: Secondary | ICD-10-CM | POA: Diagnosis not present

## 2023-09-07 DIAGNOSIS — J329 Chronic sinusitis, unspecified: Secondary | ICD-10-CM | POA: Diagnosis not present

## 2023-09-07 DIAGNOSIS — R040 Epistaxis: Secondary | ICD-10-CM | POA: Diagnosis not present

## 2023-09-07 DIAGNOSIS — J349 Unspecified disorder of nose and nasal sinuses: Secondary | ICD-10-CM | POA: Diagnosis not present

## 2023-09-07 DIAGNOSIS — J322 Chronic ethmoidal sinusitis: Secondary | ICD-10-CM | POA: Diagnosis not present

## 2023-09-07 DIAGNOSIS — Z7901 Long term (current) use of anticoagulants: Secondary | ICD-10-CM | POA: Insufficient documentation

## 2023-09-07 DIAGNOSIS — J320 Chronic maxillary sinusitis: Secondary | ICD-10-CM | POA: Diagnosis not present

## 2023-09-07 DIAGNOSIS — J3489 Other specified disorders of nose and nasal sinuses: Secondary | ICD-10-CM | POA: Diagnosis not present

## 2023-09-07 DIAGNOSIS — J338 Other polyp of sinus: Secondary | ICD-10-CM | POA: Diagnosis not present

## 2023-09-07 DIAGNOSIS — J342 Deviated nasal septum: Secondary | ICD-10-CM | POA: Diagnosis not present

## 2023-09-07 LAB — CBC WITH DIFFERENTIAL/PLATELET
Abs Immature Granulocytes: 0.04 10*3/uL (ref 0.00–0.07)
Basophils Absolute: 0 10*3/uL (ref 0.0–0.1)
Basophils Relative: 0 %
Eosinophils Absolute: 0 10*3/uL (ref 0.0–0.5)
Eosinophils Relative: 0 %
HCT: 37.5 % — ABNORMAL LOW (ref 39.0–52.0)
Hemoglobin: 13.4 g/dL (ref 13.0–17.0)
Immature Granulocytes: 0 %
Lymphocytes Relative: 8 %
Lymphs Abs: 0.7 10*3/uL (ref 0.7–4.0)
MCH: 33.6 pg (ref 26.0–34.0)
MCHC: 35.7 g/dL (ref 30.0–36.0)
MCV: 94 fL (ref 80.0–100.0)
Monocytes Absolute: 0.1 10*3/uL (ref 0.1–1.0)
Monocytes Relative: 1 %
Neutro Abs: 8.1 10*3/uL — ABNORMAL HIGH (ref 1.7–7.7)
Neutrophils Relative %: 91 %
Platelets: 173 10*3/uL (ref 150–400)
RBC: 3.99 MIL/uL — ABNORMAL LOW (ref 4.22–5.81)
RDW: 12.9 % (ref 11.5–15.5)
WBC: 9 10*3/uL (ref 4.0–10.5)
nRBC: 0 % (ref 0.0–0.2)

## 2023-09-07 MED ORDER — SODIUM CHLORIDE 0.9 % IV BOLUS
500.0000 mL | Freq: Once | INTRAVENOUS | Status: AC
  Administered 2023-09-07: 500 mL via INTRAVENOUS

## 2023-09-07 MED ORDER — TRANEXAMIC ACID FOR EPISTAXIS
500.0000 mg | Freq: Once | TOPICAL | Status: DC
  Filled 2023-09-07: qty 10

## 2023-09-07 MED ORDER — TRANEXAMIC ACID-NACL 1000-0.7 MG/100ML-% IV SOLN
1000.0000 mg | INTRAVENOUS | Status: AC
  Administered 2023-09-07: 1000 mg via INTRAVENOUS
  Filled 2023-09-07: qty 100

## 2023-09-07 NOTE — Discharge Instructions (Addendum)
 Go to the [facility] emergency department on [address] and [facility] in [place] if any further bleeding.  Follow-up with Dr. Karis as scheduled.  Take your blood pressure medicines at home tonight as scheduled.  Do not blow your nose.

## 2023-09-07 NOTE — ED Provider Notes (Signed)
 [place] EMERGENCY DEPARTMENT AT [facility] Provider Note   CSN: [id number] Arrival date & time: 09/07/23  1943     Patient presents with: Post-op Problem and Epistaxis   Chad Ellis is a 63 y.o. male.   Patient complains of nosebleeding after having surgery today.  Patient had repair of a deviated septum and cleaning out of his sinuses.  Patient reports he had surgery this afternoon by Dr. Tio.  Patient was previously taking Plavix  and Xarelto  prior to surgery.  Patient stopped both of these medications.  Patient reports he did not have very much bleeding during surgery or initially afterwards however after arriving at home he began having bleeding from his nose and bleeding down the back of his throat.  Patient denies any other complaints no fever no chills no cough.  The history is provided by the patient and the spouse. No language interpreter was used.  Epistaxis      Prior to Admission medications   Medication Sig Start Date End Date Taking? Authorizing Provider  albuterol  (PROVENTIL  HFA;VENTOLIN  HFA) 108 (90 Base) MCG/ACT inhaler Inhale 1-2 puffs into the lungs every 6 (six) hours as needed for wheezing or shortness of breath.    [provider]  allopurinol (ZYLOPRIM) 300 MG tablet Take 100 mg by mouth daily.    [provider]  ALPRAZolam  (XANAX ) 1 MG tablet Take 1 mg by mouth 3 (three) times daily as needed for anxiety or sleep.     [provider]  atorvastatin  (LIPITOR ) 80 MG tablet TAKE 1 TABLET BY MOUTH DAILY AT 6PM 07/17/19   [place], Peter M, MD  budesonide  (ENTOCORT EC ) 3 MG 24 hr capsule Take 3 capsules (9 mg total) by mouth daily for 21 days, THEN 2 capsules (6 mg total) daily. 09/03/23 11/23/23  Kennedy Charmaine CROME, NP  Cholecalciferol (VITAMIN D-3) 125 MCG (5000 UT) TABS Take 5,000 Units by mouth in the morning and at bedtime.    [provider]  cholestyramine  (QUESTRAN ) 4 GM/DOSE powder Take 1 packet (4 g  total) by mouth daily. 07/16/23   Kennedy Charmaine CROME, NP  clobetasol (TEMOVATE) 0.05 % external solution Apply 1 Application topically daily as needed (psoriasis). 03/31/22   [provider]  clopidogrel  (PLAVIX ) 75 MG tablet Take 75 mg by mouth daily. 06/08/22   [provider]  fluticasone  (FLONASE ) 50 MCG/ACT nasal spray Place 2 sprays into both nostrils daily. 06/07/23 09/03/23  Karis Clunes, MD  lisinopril  (ZESTRIL ) 10 MG tablet Take 1 tablet (10 mg total) by mouth daily. 04/09/23   [place], Peter M, MD  loperamide (IMODIUM) 2 MG capsule Take 1 mg by mouth as needed for diarrhea or loose stools.    [provider]  meclizine (ANTIVERT) 25 MG tablet Take 25 mg by mouth every 6 (six) hours as needed for dizziness. 08/03/22   [provider]  MELATONIN PO Take 1 tablet by mouth at bedtime.    [provider]  metoprolol  succinate (TOPROL -XL) 100 MG 24 hr tablet TAKE ONE TABLET BY MOUTH ONCE DAILY IN THE EVENING TAKE WITH OR IMMEDIATELY FOLLOWING A MEAL 02/04/19   [place], Peter M, MD  Omega-3 Fatty Acids (SALMON OIL-1000 PO) Take 1,000 mg by mouth in the morning and at bedtime.    [provider]  omeprazole  (PRILOSEC) 20 MG capsule TAKE 1 CAPSULE (20 MG TOTAL) BY MOUTH DAILY. 30 MINUTES BEFORE BREAKFAST. FOR REFLUX. 05/18/23   Kennedy Charmaine CROME, NP  rivaroxaban  (  XARELTO ) 20 MG TABS tablet Take 1 tablet (20 mg total) by mouth daily with supper. 08/08/22   Vicci Rollo SAUNDERS, PA-C  sotalol  (BETAPACE ) 120 MG tablet Take 1 tablet (120 mg total) by mouth 2 (two) times daily. 12/02/18   [place], Peter M, MD  tamsulosin  (FLOMAX ) 0.4 MG CAPS capsule Take 0.4 mg by mouth daily.    [provider]  XARELTO  20 MG TABS tablet TAKE 1 TABLET (20 MG TOTAL) BY MOUTH DAILY WITH SUPPER. 12/03/18   [facility], Jon Garre, PA    Allergies: Patient has no known allergies.    Review of Systems  HENT:  Positive for nosebleeds.   All other systems reviewed and are  negative.   Updated Vital Signs BP (!) 156/108   Pulse 61   Resp 18   Wt 109.6 kg   SpO2 98%   BMI 31.88 kg/m   Physical Exam Vitals and nursing note reviewed.  Constitutional:      Appearance: He is well-developed.  HENT:     Head: Normocephalic.     Nose:     Comments: Bleeding from right nare, splint in place, spitting up blood.    Mouth/Throat:     Mouth: Mucous membranes are moist.   Cardiovascular:     Rate and Rhythm: Normal rate.  Pulmonary:     Effort: Pulmonary effort is normal.  Abdominal:     General: There is no distension.   Musculoskeletal:        General: Normal range of motion.   Skin:    General: Skin is warm.   Neurological:     General: No focal deficit present.     Mental Status: He is alert and oriented to person, place, and time.   Psychiatric:        Mood and Affect: Mood normal.     (all labs ordered are listed, but only abnormal results are displayed) Labs Reviewed  CBC WITH DIFFERENTIAL/PLATELET    EKG: None  Radiology: No results found.   Procedures   Medications Ordered in the ED  tranexamic acid (CYKLOKAPRON) 1000 MG/10ML topical solution 500 mg (has no administration in time range)                                    Medical Decision Making Patient complains of bleeding from his nose and down the back of his throat after having surgery today  Amount and/or Complexity of Data Reviewed Independent Historian: spouse    Details: Patient is here with his wife who is supportive Labs: ordered.    Details: Labs ordered Discussion of management or test interpretation with external provider(s): I discussed the patient with Dr. Karis, ENT who did the surgery on the patient today.  He advise given the patient TXA and checking a CBC.  If patient has continued bleeding after TXA I will send the patient to [facility].  Risk Risk Details: Patient's bleeding stopped after receiving TXA.  Patient is advised that he should go to  [facility] emergency department in [place] on [address] if he has further bleeding.  He is advised to call Dr. Karis  if further bleeding.  Patient is feeling much better and wants to go home.  He is advised to take his blood pressure medicines as scheduled.        Final diagnoses:  Epistaxis    ED Discharge Orders  None       An After Visit Summary was printed and given to the patient.    Flint Sonny POUR, PA-C 09/07/23 2258    Freddi Hamilton, MD 09/09/23 1040

## 2023-09-07 NOTE — ED Triage Notes (Signed)
 Pt in with epistaxis after post-op procedure. Pt had ENT surgery today to clean out infection of L cheek and straighten his septum - had 2 stents placed today, done by Dr. Karis and he came out of surgery at 4pm. Pt was placed with a post-op dressing with gauze and pressure. Bleeding began at 5p, and pt states he has changed out the gauze 15x's since surgery. Was on Xarelto  and stopped it 7 days ago, stopped Plavix  5 days ago

## 2023-09-07 NOTE — ED Notes (Signed)
 Med placed at bedside for provider use

## 2023-09-10 ENCOUNTER — Ambulatory Visit (INDEPENDENT_AMBULATORY_CARE_PROVIDER_SITE_OTHER): Admitting: Otolaryngology

## 2023-09-10 ENCOUNTER — Encounter (INDEPENDENT_AMBULATORY_CARE_PROVIDER_SITE_OTHER): Payer: Self-pay | Admitting: Otolaryngology

## 2023-09-10 DIAGNOSIS — J338 Other polyp of sinus: Secondary | ICD-10-CM | POA: Diagnosis not present

## 2023-09-10 DIAGNOSIS — Z9889 Other specified postprocedural states: Secondary | ICD-10-CM

## 2023-09-10 DIAGNOSIS — J320 Chronic maxillary sinusitis: Secondary | ICD-10-CM | POA: Insufficient documentation

## 2023-09-10 DIAGNOSIS — J322 Chronic ethmoidal sinusitis: Secondary | ICD-10-CM | POA: Diagnosis not present

## 2023-09-10 NOTE — Progress Notes (Signed)
 Patient ID: Chad Ellis, male   DOB: 04/29/1960, 63 y.o.   MRN: [id number]  Procedure: Left nasal/sinus debridement status post endoscopic sinus surgery.  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  The patient returns today complaining of significant nasal congestion and crusting.  He had a significant amount of bleeding from the nasal cavities.  He was evaluated at the [facility] emergency room, and was treated with TXA infusion.  Currently the patient denies any facial pain, fever, or visual change.  Anesthesia: Topical Xylocaine  and Neo-Synephrine.  Description: The patient is placed upright in the exam chair. Both nasal cavities are sprayed with topical Xylocaine  and Neo-Synephrine.  A 0 rigid endoscope is used for the examination. The scope is advanced past the left nostril into the left nasal cavity. A significant amount of crusting and blood clots are noted within the left nasal cavity and the maxillary/ethmoid cavities.  The crusting and blood clots are removed with suction catheters and alligator forceps, which are inserted in parallel with the rigid endoscope.  After the debridement procedure, the maxillary antrum and the ethmoid cavities are noted to be widely patent. The patient tolerated the procedure well.  Follow up care: The patient is instructed to perform daily nasal saline irrigation.  The patient will return for re-evaluation in approximately 2 weeks.

## 2023-09-12 NOTE — Progress Notes (Signed)
 Cardiology Office Note:    Date:  09/17/2023   ID:  Chad Ellis, DOB June 16, 1960, MRN [id number]  PCP:  [facility]   [facility] Providers Cardiologist:  Mikhaila Roh [place], MD {    Referring MD: [facility]   Chief Complaint  Patient presents with   Coronary Artery Disease   Atrial Fibrillation    History of Present Illness:    Chad Ellis is a 63 y.o. male with a hx of HTN, HLD, CAD s/p DES to RCA in 2018, PAF on Xarelto . He was admitted in 06/2016 after presenting to the ED complaining of chest pain. He was found to be in afib with RVR. He underwent left heart cath on 07/06/16 that showed acute/subacute proximal RCA occlusion with extensive thrombus burden. This was treated with thrombectomy, PTCA and DES. Otherwise, there was mild nonobstructive concomitant CAD. Echocardiogram on 07/08/16 showed EF 55-60%, grade I diastolic dysfunction, mildly dilated aortic root measuring 38 mm. He was seen by Dr. Kelsie and was started on sotalol . Patient was discharged on ASA, plavix , and Xarelto  for 1 month with plans to drop ASA. He later had a nuclear stress test on 05/25/17 that showed EF 64%, no evidence of ischemia or infarction.   On follow up today he is doing well from a cardiac standpoint. He denies any chest pain, dyspnea, palpitations. He did have recent sinus surgery and had significant bleeding afterwards requiring a return to ED. Still having some clots. Did not hold plavix  for surgery- states he wasn't aware he was on this.     Past Medical History:  Diagnosis Date   Anxiety    Asthma    Coronary artery disease    a. 06/2016: NSTEMI s/p DES to RCA   Diverticulosis    Dressler's syndrome (HCC)    Dysrhythmia    Ectopic atrial rhythm    HLD (hyperlipidemia)    HTN (hypertension)    Myocardial infarction (HCC)    2017   PAF (paroxysmal atrial fibrillation) (HCC)    a. 06/2016: Dx'd and started on Sotolol and Xarelto     Past  Surgical History:  Procedure Laterality Date   CHONDROPLASTY Right 05/10/2012   Procedure: CHONDROPLASTY;  Surgeon: Taft FORBES Minerva, MD;  Location: [facility];  Service: Orthopedics;  Laterality: Right;  of patella   COLONOSCOPY     COLONOSCOPY WITH PROPOFOL  N/A 04/17/2022   Procedure: COLONOSCOPY WITH PROPOFOL ;  Surgeon: Cindie Carlin POUR, DO;  Location: [facility];  Service: Endoscopy;  Laterality: N/A;  730am, asa 3   CORONARY STENT INTERVENTION N/A 07/06/2016   Procedure: Coronary Stent Intervention;  Surgeon: Debby DELENA Sor, MD;  Location: [facility] CV [facility];  Service: Cardiovascular;  Laterality: N/A;   ESOPHAGOGASTRODUODENOSCOPY     EXAM UNDER ANESTHESIA WITH MANIPULATION OF KNEE Right 05/10/2012   Procedure: EXAM UNDER ANESTHESIA WITH MANIPULATION OF KNEE;  Surgeon: Taft FORBES Minerva, MD;  Location: [facility];  Service: Orthopedics;  Laterality: Right;  start 0750 end 751   KNEE ARTHROSCOPY WITH EXCISION PLICA Right 05/10/2012   Procedure: KNEE ARTHROSCOPY WITH EXCISION PLICA;  Surgeon: Taft FORBES Minerva, MD;  Location: [facility];  Service: Orthopedics;  Laterality: Right;   KNEE ARTHROSCOPY WITH MEDIAL MENISECTOMY Right 05/10/2012   Procedure: KNEE ARTHROSCOPY WITH MEDIAL MENISECTOMY;  Surgeon: Taft FORBES Minerva, MD;  Location: [facility];  Service: Orthopedics;  Laterality: Right;   LEFT HEART CATH AND CORONARY ANGIOGRAPHY N/A 07/06/2016   Procedure: Left Heart  Cath and Coronary Angiography;  Surgeon: Debby DELENA Sor, MD;  Location: [facility] INV[facility] CV LAB;  Service: Cardiovascular;  Laterality: N/A;   POLYPECTOMY  04/17/2022   Procedure: POLYPECTOMY;  Surgeon: Cindie Carlin POUR, DO;  Location: [facility];  Service: Endoscopy;;    Current Medications: Current Meds  Medication Sig   albuterol  (PROVENTIL  HFA;VENTOLIN  HFA) 108 (90 Base) MCG/ACT inhaler Inhale 1-2 puffs into the lungs every 6 (six) hours as needed for wheezing or shortness of breath.   allopurinol (ZYLOPRIM) 300 MG tablet Take 100 mg  by mouth daily.   ALPRAZolam  (XANAX ) 1 MG tablet Take 1 mg by mouth 3 (three) times daily as needed for anxiety or sleep.    atorvastatin  (LIPITOR ) 80 MG tablet TAKE 1 TABLET BY MOUTH DAILY AT 6PM   budesonide  (ENTOCORT EC ) 3 MG 24 hr capsule Take 3 capsules (9 mg total) by mouth daily for 21 days, THEN 2 capsules (6 mg total) daily.   Cholecalciferol (VITAMIN D-3) 125 MCG (5000 UT) TABS Take 5,000 Units by mouth in the morning and at bedtime.   clobetasol (TEMOVATE) 0.05 % external solution Apply 1 Application topically daily as needed (psoriasis).   fluticasone  (FLONASE ) 50 MCG/ACT nasal spray Place 2 sprays into both nostrils daily.   lisinopril  (ZESTRIL ) 10 MG tablet Take 1 tablet (10 mg total) by mouth daily.   loperamide (IMODIUM) 2 MG capsule Take 1 mg by mouth as needed for diarrhea or loose stools.   MELATONIN PO Take 1 tablet by mouth at bedtime.   metoprolol  succinate (TOPROL -XL) 100 MG 24 hr tablet TAKE ONE TABLET BY MOUTH ONCE DAILY IN THE EVENING TAKE WITH OR IMMEDIATELY FOLLOWING A MEAL   Omega-3 Fatty Acids (SALMON OIL-1000 PO) Take 1,000 mg by mouth in the morning and at bedtime.   omeprazole  (PRILOSEC) 20 MG capsule TAKE 1 CAPSULE (20 MG TOTAL) BY MOUTH DAILY. 30 MINUTES BEFORE BREAKFAST. FOR REFLUX.   sotalol  (BETAPACE ) 120 MG tablet Take 1 tablet (120 mg total) by mouth 2 (two) times daily.   tamsulosin  (FLOMAX ) 0.4 MG CAPS capsule Take 0.4 mg by mouth daily.   XARELTO  20 MG TABS tablet TAKE 1 TABLET (20 MG TOTAL) BY MOUTH DAILY WITH SUPPER.   [DISCONTINUED] cholestyramine  (QUESTRAN ) 4 GM/DOSE powder Take 1 packet (4 g total) by mouth daily.   [DISCONTINUED] clopidogrel  (PLAVIX ) 75 MG tablet Take 75 mg by mouth daily.     Allergies:   Patient has no known allergies.   Social History   Socioeconomic History   Marital status: Divorced    Spouse name: Not on file   Number of children: Not on file   Years of education: Not on file   Highest education level: Not on file   Occupational History   Not on file  Tobacco Use   Smoking status: Never   Smokeless tobacco: Never  Vaping Use   Vaping status: Never Used  Substance and Sexual Activity   Alcohol use: Yes    Comment: every other day use of brandy or beer   Drug use: No   Sexual activity: Not on file  Other Topics Concern   Not on file  Social History Narrative   Not on file   Social Drivers of Health   Financial Resource Strain: Not on file  Food Insecurity: Not on file  Transportation Needs: Not on file  Physical Activity: Not on file  Stress: Not on file  Social Connections: Not on file  Family History: The patient's family history includes Atrial fibrillation in his mother; COPD in his father; Cholecystitis in his sister; Diabetes in his father; Mesothelioma in his father.  ROS:   Please see the history of present illness.     All other systems reviewed and are negative.  EKGs/Labs/Other Studies Reviewed:    The following studies were reviewed today: Cardiac Studies & Procedures   ______________________________________________________________________________________________ CARDIAC CATHETERIZATION  CARDIAC CATHETERIZATION 07/06/2016  Conclusion  1st Mrg lesion, 20 %stenosed.  Mid LAD lesion, 20 %stenosed.  A STENT SYNERGY DES 3.5X38 drug eluting stent was successfully placed.  Prox RCA to Mid RCA lesion, 100 %stenosed.  Post intervention, there is a 0% residual stenosis.  The left ventricular ejection fraction is 45-50% by visual estimate.  There is mild left ventricular systolic dysfunction.  Acute/subacute very proximal RCA occlusion with extensive thrombus burden and TIMI 0 flow.  Mild nonobstructive concomitant CAD with 20% LAD stenosis and 20% circumflex narrowing and a large left circumflex vessel.  There was a hint of very minimal if any distal collateralization of the PLA vessel from the RCA via the left injection.  Difficult but successful PCI to the  totally occluded RCA with extensive thrombus requiring thrombectomy, PTCA, and ultimate DES stenting with a 3.538 mm Synergy DES stent postdilated to 4.14 mm very proximally and 3.9-4.0 mm distally in a very large RCA that supplies the PDA and PLA vessel.  RECOMMENDATION: Dual antiplatelet therapy.  The patient will continue on Aggrastat  for 18 hours post procedure.  The patient will be started on high potency statin therapy, and medical therapy post MI including ACE-I/ARB, and beta blockerRx.  Findings Coronary Findings Diagnostic  Dominance: Co-dominant  Left Anterior Descending  Left Circumflex  First Obtuse Marginal Branch  Right Coronary Artery  Intervention  Prox RCA to Mid RCA lesion Angioplasty Pre-stent angioplasty was performed using a BALLOON MOZEC 2.0X12. A STENT SYNERGY DES 3.5X38 drug eluting stent was successfully placed. Stent strut is well apposed. Post-stent angioplasty was performed using a BALLOON Adrian MOZEC 4.0X28. Supplies used: BALLOON MOZEC 3.0X38; BALLOON EMERGE MR 2.5X15; BALLOON Dierks MOZEC 4.0X8 There is a 0% residual stenosis post intervention.   STRESS TESTS  MYOCARDIAL PERFUSION IMAGING 05/25/2017  Narrative  The left ventricular ejection fraction is normal (55-65%).  Nuclear stress EF: 64%.  There was no ST segment deviation noted during stress.  The study is normal.  1. EF 64%, normal wall motion. 2. No evidence for ischemia or infarction on perfusion images.  Normal study.   ECHOCARDIOGRAM  ECHOCARDIOGRAM COMPLETE 07/08/2016  Narrative *[place]* *[facility]* [address] [place], [place] [address] [phone]  ------------------------------------------------------------------- Transthoracic Echocardiography  Patient:    Reinhardt, Licausi MR #:       [id number] Study Date: 07/08/2016 Gender:     M Age:        55 Height:     185.4 cm Weight:     129.5 kg BSA:        2.63 m^2 Pt. Status: Room:        2W09C  SONOGRAPHER  Tinnie Barefoot, RDCS, CCT ADMITTING    Pearla Rout, MD ATTENDING    Pearla Rout, MD PERFORMING   [facility], Inpatient ORDERING     Sebastian Collar R  cc:  ------------------------------------------------------------------- LV EF: 55% -   60%  ------------------------------------------------------------------- Indications:      Atrial fibrillation - 427.31.  ------------------------------------------------------------------- Study Conclusions  - Left ventricle: The cavity size was normal.  Wall thickness was increased in a pattern of mild LVH. Systolic function was normal. The estimated ejection fraction was in the range of 55% to 60%. Basal inferior akinesis. Doppler parameters are consistent with abnormal left ventricular relaxation (grade 1 diastolic dysfunction). - Aortic valve: There was no stenosis. - Aorta: Mildly dilated aortic root. Aortic root dimension: 38 mm (ED). - Mitral valve: There was trivial regurgitation. - Left atrium: The atrium was mildly dilated. - Right ventricle: Mildly dilated RV with mild to moderately decreased systolic function. The free wall is hypokinetic compared to the apex. No PE on CTA, would be concerned for RV infarction. - Right atrium: The atrium was mildly dilated. - Pulmonary arteries: No complete TR doppler jet so unable to estimate PA systolic pressure. - Systemic veins: IVC measured 2.4 cm with > 50% respirophasic variation, suggesting RA pressure 8 mmHg.  Impressions:  - Normal LV size with mild LV hypertrophy. EF 55-60%, basal inferior akinesis. The RV was mildly dilated with mild to moderate systolic dysfunction. Free wall was hypokinetic compared to apex, in absence of PE would consider RV infarction. No significant valvular abnormalities.  ------------------------------------------------------------------- Study data:  No prior study was available for comparison.  Study status:  Routine.   Procedure:  The patient reported no pain pre or post test. Transthoracic echocardiography. Image quality was adequate.  Study completion:  There were no complications. Transthoracic echocardiography.  M-mode, complete 2D, spectral Doppler, and color Doppler.  Birthdate:  Patient birthdate: 10-14-1960.  Age:  Patient is 63 yr old.  Sex:  Gender: male. BMI: 37.7 kg/m^2.  Blood pressure:     141/84  Patient status: Inpatient.  Study date:  Study date: 07/08/2016. Study time: 12:15 PM.  Location:  Echo laboratory.  -------------------------------------------------------------------  ------------------------------------------------------------------- Left ventricle:  The cavity size was normal. Wall thickness was increased in a pattern of mild LVH. Systolic function was normal. The estimated ejection fraction was in the range of 55% to 60%. Basal inferior akinesis. Doppler parameters are consistent with abnormal left ventricular relaxation (grade 1 diastolic dysfunction).  ------------------------------------------------------------------- Aortic valve:   Trileaflet.  Doppler:   There was no stenosis. There was no regurgitation.  ------------------------------------------------------------------- Aorta:  Mildly dilated aortic root.  ------------------------------------------------------------------- Mitral valve:   Normal thickness leaflets .  Doppler:   There was no evidence for stenosis.   There was trivial regurgitation.  ------------------------------------------------------------------- Left atrium:  The atrium was mildly dilated.  ------------------------------------------------------------------- Right ventricle:  Mildly dilated RV with mild to moderately decreased systolic function. The free wall is hypokinetic compared to the apex. No PE on CTA, would be concerned for RV infarction.  ------------------------------------------------------------------- Pulmonic valve:     Structurally normal valve.   Cusp separation was normal.  Doppler:  Transvalvular velocity was within the normal range. There was no regurgitation.  ------------------------------------------------------------------- Tricuspid valve:   Doppler:  There was trivial regurgitation.  ------------------------------------------------------------------- Pulmonary artery:   No complete TR doppler jet so unable to estimate PA systolic pressure.  ------------------------------------------------------------------- Right atrium:  The atrium was mildly dilated.  ------------------------------------------------------------------- Pericardium:  There was no pericardial effusion.  ------------------------------------------------------------------- Systemic veins:  IVC measured 2.4 cm with > 50% respirophasic variation, suggesting RA pressure 8 mmHg.  ------------------------------------------------------------------- Post procedure conclusions Ascending Aorta:  - Mildly dilated aortic root.  ------------------------------------------------------------------- Measurements  Left ventricle                         Value  Reference LV ID, ED, PLAX chordal                47.5  mm     43 - 52 LV ID, ES, PLAX chordal                32.6  mm     23 - 38 LV fx shortening, PLAX chordal         31    %      >=29 LV PW thickness, ED                    13.8  mm     ---------- IVS/LV PW ratio, ED                    0.74         <=1.3 Stroke volume, 2D                      91    ml     ---------- Stroke volume/bsa, 2D                  35    ml/m^2 ---------- LV e&', lateral                         9.57  cm/s   ---------- LV E/e&', lateral                       6.44         ---------- LV e&', medial                          9.45  cm/s   ---------- LV E/e&', medial                        6.52         ---------- LV e&', average                         9.51  cm/s   ---------- LV E/e&', average                        6.48         ----------  Ventricular septum                     Value        Reference IVS thickness, ED                      10.2  mm     ----------  LVOT                                   Value        Reference LVOT area                              4.52  cm^2   ---------- LVOT peak velocity, S                  93.1  cm/s   ---------- LVOT mean velocity, S  61.8  cm/s   ---------- LVOT VTI, S                            20.2  cm     ----------  Aorta                                  Value        Reference Aortic root ID, ED                     33    mm     ----------  Left atrium                            Value        Reference LA ID, A-P, ES                         43    mm     ---------- LA ID/bsa, A-P                         1.63  cm/m^2 <=2.2 LA volume, S                           68.1  ml     ---------- LA volume/bsa, S                       25.9  ml/m^2 ---------- LA volume, ES, 1-p A4C                 71.8  ml     ---------- LA volume/bsa, ES, 1-p A4C             27.3  ml/m^2 ---------- LA volume, ES, 1-p A2C                 65.1  ml     ---------- LA volume/bsa, ES, 1-p A2C             24.7  ml/m^2 ----------  Mitral valve                           Value        Reference Mitral E-wave peak velocity            61.6  cm/s   ---------- Mitral A-wave peak velocity            74.2  cm/s   ---------- Mitral deceleration time               180   ms     150 - 230 Mitral E/A ratio, peak                 0.8          ----------  Right atrium                           Value        Reference RA ID, S-I, ES, A4C            (H)     58.9  mm  34 - 49 RA area, ES, A4C               (H)     22.3  cm^2   8.3 - 19.5 RA volume, ES, A/L                     70.6  ml     ---------- RA volume/bsa, ES, A/L                 26.8  ml/m^2 ----------  Systemic veins                         Value        Reference Estimated CVP                          3     mm Hg   ----------  Right ventricle                        Value        Reference RV s&', lateral, S                      11.6  cm/s   ----------  Legend: (L)  and  (H)  mark values outside specified reference range.  ------------------------------------------------------------------- Prepared and Electronically Authenticated by  Ezra Shuck, M.D. 2018-04-28T15:25:25          ______________________________________________________________________________________________       EKG Interpretation Date/Time:  Monday September 17 2023 08:11:27 EDT Ventricular Rate:  67 PR Interval:  182 QRS Duration:  80 QT Interval:  432 QTC Calculation: 456 R Axis:   13  Text Interpretation: Normal sinus rhythm Normal ECG When compared with ECG of May 28,2024  No significant change was found  Confirmed by [place], Lanay Zinda [phone]) on 09/17/2023 8:22:32 AM    Recent Labs: 09/07/2023: Hemoglobin 13.4; Platelets 173  Recent Lipid Panel    Component Value Date/Time   CHOL 135 10/09/2018 0958   TRIG 217 (H) 10/09/2018 0958   HDL 28 (L) 10/09/2018 0958   CHOLHDL 4.8 10/09/2018 0958   CHOLHDL 8.3 07/06/2016 0307   VLDL 63 (H) 07/06/2016 0307   LDLCALC 64 10/09/2018 0958   Dated9/10/24: cholesterol 118, triglycerides 231, HDL 29, LDL 52, CMET and TSH normal.   Risk Assessment/Calculations:    CHA2DS2-VASc Score =    This indicates a  % annual risk of stroke. The patient's score is based upon:               Physical Exam:    VS:  BP 112/70   Pulse 67   Ht 6' 1 (1.854 m)   Wt 249 lb 9.6 oz (113.2 kg)   SpO2 97%   BMI 32.93 kg/m     Wt Readings from Last 3 Encounters:  09/17/23 249 lb 9.6 oz (113.2 kg)  09/07/23 241 lb 9.6 oz (109.6 kg)  09/03/23 241 lb 9.6 oz (109.6 kg)     GEN:  normal  HEENT: Normal NECK: No JVD; No carotid bruits CARDIAC: RRR, no murmurs, rubs, gallops. Radial pulses 2+ bilaterally  RESPIRATORY:  Clear to auscultation without rales, wheezing or rhonchi. Normal  work of breathing on room air  ABDOMEN: Soft, non-tender, non-distended MUSCULOSKELETAL:  No edema in BLE; No deformity  SKIN: Warm and dry NEUROLOGIC:  Alert and oriented x 3  PSYCHIATRIC:  Normal affect   ASSESSMENT:    1. PAF (paroxysmal atrial fibrillation) (HCC)   2. Coronary artery disease involving native coronary artery of native heart without angina pectoris   3. Hyperlipidemia, unspecified hyperlipidemia type   4. Essential hypertension     PLAN:    In order of problems listed above:  Paroxysmal Atrial fibrillation  - Patient is maintaining NSR today  - Continue sotalol  120 mg BID, metoprolol  succinate 100 mg daily  - Ecg today is normal.  - He has follow up with ENT on the 16 th. If bleeding resolved at that time would resume Xarelto .   CAD  - Patient previously had thrombectomy, PTCA and DES to the proximal RCA in 06/2016 - He has been chest pain free. No chest pain or DOE  - Continue metoprolol  succinate 100 mg daily  - Continue lipitor  80 mg daily  - I would recommend stopping Plavix  at this point since he is on Xarelto .   HLD  - Last LDL 52 at goal  - Continue lipitor  80 mg daily  - continue dietary modification  HTN  - BP is well controlled.   Follow up in one year  Medication Adjustments/Labs and Tests Ordered: Current medicines are reviewed at length with the patient today.  Concerns regarding medicines are outlined above.  Orders Placed This Encounter  Procedures   EKG 12-Lead   No orders of the defined types were placed in this encounter.   There are no Patient Instructions on file for this visit.   Signed, Makylie Rivere [place], MD  09/17/2023 8:35 AM    [facility]

## 2023-09-17 ENCOUNTER — Ambulatory Visit: Attending: Cardiology | Admitting: Cardiology

## 2023-09-17 ENCOUNTER — Encounter: Payer: Self-pay | Admitting: Cardiology

## 2023-09-17 VITALS — BP 112/70 | HR 67 | Ht 73.0 in | Wt 249.6 lb

## 2023-09-17 DIAGNOSIS — I2510 Atherosclerotic heart disease of native coronary artery without angina pectoris: Secondary | ICD-10-CM | POA: Diagnosis not present

## 2023-09-17 DIAGNOSIS — E785 Hyperlipidemia, unspecified: Secondary | ICD-10-CM | POA: Diagnosis not present

## 2023-09-17 DIAGNOSIS — I480 Paroxysmal atrial fibrillation: Secondary | ICD-10-CM | POA: Diagnosis not present

## 2023-09-17 DIAGNOSIS — I10 Essential (primary) hypertension: Secondary | ICD-10-CM

## 2023-09-17 NOTE — Patient Instructions (Signed)
 Medication Instructions:  Stop Plavix  Start Xarelto  when ok by ENT Continue all other medications *If you need a refill on your cardiac medications before your next appointment, please call your pharmacy*  Lab Work: None ordered  Testing/Procedures: None ordered  Follow-Up: At [facility], you and your health needs are our priority.  As part of our continuing mission to provide you with exceptional heart care, our providers are all part of one team.  This team includes your primary Cardiologist (physician) and Advanced Practice Providers or APPs (Physician Assistants and Nurse Practitioners) who all work together to provide you with the care you need, when you need it.  Your next appointment:  1 year   Call in March to schedule July appointment    Provider:  Dr.Jordan   We recommend signing up for the patient portal called MyChart.  Sign up information is provided on this After Visit Summary.  MyChart is used to connect with patients for Virtual Visits (Telemedicine).  Patients are able to view lab/test results, encounter notes, upcoming appointments, etc.  Non-urgent messages can be sent to your provider as well.   To learn more about what you can do with MyChart, go to [email or web address].

## 2023-09-26 ENCOUNTER — Encounter (INDEPENDENT_AMBULATORY_CARE_PROVIDER_SITE_OTHER): Payer: Self-pay | Admitting: Otolaryngology

## 2023-09-26 ENCOUNTER — Ambulatory Visit (INDEPENDENT_AMBULATORY_CARE_PROVIDER_SITE_OTHER): Admitting: Otolaryngology

## 2023-09-26 VITALS — BP 118/87 | HR 67

## 2023-09-26 DIAGNOSIS — J320 Chronic maxillary sinusitis: Secondary | ICD-10-CM

## 2023-09-26 DIAGNOSIS — J322 Chronic ethmoidal sinusitis: Secondary | ICD-10-CM

## 2023-09-26 DIAGNOSIS — J338 Other polyp of sinus: Secondary | ICD-10-CM | POA: Diagnosis not present

## 2023-09-27 NOTE — Progress Notes (Signed)
 Patient ID: Chad Ellis, male   DOB: 1960/12/05, 63 y.o.   MRN: [id number]  Procedure: Left nasal/sinus debridement status post endoscopic sinus surgery.   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  The patient returns today complaining of persistent nasal congestion and crusting, with frequent headaches.  He has not had any significant bleeding since his last visit.  Currently the patient denies any fever, or visual change.   Anesthesia: Topical Xylocaine  and Neo-Synephrine.   Description: The patient is placed upright in the exam chair. Both nasal cavities are sprayed with topical Xylocaine  and Neo-Synephrine.  A 0 rigid endoscope is used for the examination. The scope is advanced past the left nostril into the left nasal cavity. A moderate amount of crusting is noted within the left nasal cavity and the maxillary/ethmoid cavities.  The crusting is removed with suction catheters and alligator forceps, which are inserted in parallel with the rigid endoscope.  After the debridement procedure, the maxillary antrum and the ethmoid cavities are noted to be widely patent. The patient tolerated the procedure well.   Follow up care: The patient is instructed to continue daily nasal saline irrigation.  The patient will return for re-evaluation in 3 weeks.

## 2023-11-02 ENCOUNTER — Encounter (INDEPENDENT_AMBULATORY_CARE_PROVIDER_SITE_OTHER): Payer: Self-pay | Admitting: Otolaryngology

## 2023-11-02 ENCOUNTER — Ambulatory Visit (INDEPENDENT_AMBULATORY_CARE_PROVIDER_SITE_OTHER): Admitting: Otolaryngology

## 2023-11-02 VITALS — BP 126/86 | HR 72

## 2023-11-02 DIAGNOSIS — J338 Other polyp of sinus: Secondary | ICD-10-CM

## 2023-11-02 DIAGNOSIS — J320 Chronic maxillary sinusitis: Secondary | ICD-10-CM | POA: Diagnosis not present

## 2023-11-02 DIAGNOSIS — J322 Chronic ethmoidal sinusitis: Secondary | ICD-10-CM

## 2023-11-02 MED ORDER — FLUTICASONE PROPIONATE 50 MCG/ACT NA SUSP: 2.0000 | Freq: Every day | NASAL | 10 refills | Status: AC

## 2023-11-02 NOTE — Progress Notes (Signed)
 Patient ID: Chad Ellis, male   DOB: 1960-09-19, 63 y.o.   MRN: [id number]  Procedure: Left nasal/sinus debridement status post endoscopic sinus surgery.   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  The patient returns today reporting improvement in his nasal congestion and nasal crusting.SABRA  He has not had any bleeding since his last visit.  Currently the patient denies any fever, or visual change.   Anesthesia: Topical Xylocaine  and Neo-Synephrine.   Description: The patient is placed upright in the exam chair. Both nasal cavities are sprayed with topical Xylocaine  and Neo-Synephrine.  A 0 rigid endoscope is used for the examination. The scope is advanced past the left nostril into the left nasal cavity. A small amount of crusting is noted within the left nasal cavity and the maxillary/ethmoid cavities.  The crusting is removed with suction catheters and alligator forceps, which are inserted in parallel with the rigid endoscope.  After the debridement procedure, the maxillary antrum and the ethmoid cavities are noted to be widely patent. The patient tolerated the procedure well.   Follow up care: The patient is instructed to continue as needed nasal saline irrigation.  Resume Flonase  nasal spray daily.  The patient will return for re-evaluation in 6 months.

## 2023-11-04 NOTE — Progress Notes (Unsigned)
 GI Office Note    Referring Provider: [facility] Primary Care Physician:  [facility] Primary Gastroenterologist: Carlin POUR. Cindie, DO  Date:  11/05/2023  ID:  Chad Ellis, DOB 1960-11-18, MRN [id number]   Chief Complaint   Chief Complaint  Patient presents with   Follow-up    Follow up. No problems    History of Present Illness  Chad Ellis is a 63 y.o. male with a history of paroxysmal Afib, Dressler's syndrome, HLD, HTN, anxiety, and lymphocytic colitis presenting today for follow-up of diarrhea.  EGD and Colonoscopy at age 67. Had it done at [facility]. Reports he had diverticulosis and not sure about polyps.    OV 02/23/22. Chronic intermittent loose stools and urgency. Normal BM in the morning followed by looser stool before leaving the house. Has urgency and sometimes has to stop multiple places on trips to have a BM. Gas/bloating present. No weight loss or lack of appetite. Tried [facility] colon health in the past and noted improvement. Advised to complete trial of Xifaxin but unable to afford.    Colonoscopy 04/17/22: -sigmoid and descending diverticulosis -2 (4-32mm) polyps in the rectum and rectosigmoid.  -Rectal polyp with tubular adenoma and evidence of lymphocytic colitis -Sent in budesonide  for patient to take 9 mg daily for 8 weeks and then taper.  -Repeat in 5 years.    OV 06/05/22.  Having 1-2 bowel movements a day versus the 4-5 that he was having previously.  Still having some incomplete emptying or multiple trips in the morning.  Improvement in gassiness.  Eating more vegetables.  Denies any BRBPR or lack of appetite.  Not taking omeprazole  daily, usually taking every other day which is working well.  Having more so dry mouth at night.  Reducing his salt intake previously.  He was advised to continue budesonide  9 mg for 4 weeks and then do a very slow taper.  He was advised to continue omeprazole  20 mg every other day as  needed.  Requested prior colonoscopy and EGD records.  Advised follow-up in 8-10 weeks.   OV 07/16/23.taking imodium daily. Normal BM in the morning followed by a few watery BMs intermittently for a few hours. Raw perineum due to frequent wiping. Concerned that gas may be actual stool. Having rectal itching. Thought toilet tissue was the issue but still occurring even after switching. Advised trial of cholestyramine  and if not helpful then consider longer course of budesonide . Continue omeprazole  20 mg dialy. Limit dairy and consider resumption of probiotic. Use rectal hydrocortisone as needed.   Last office visit 09/03/23.  Reportedly had been on vacation and while he was there his stomach started acting up some but when he got back home he started having significantly more diarrhea.  No matter what he was eating whether it was meats or veggies he was having watery diarrhea, morning office visit was somewhat solid and somewhat watery but also more of a yellow color and reporting some mucus present.  He has had the need to have more trips to the bathroom for frequent wiping given fear of some leakage as well as also having some discomfort to the perineum from frequent wiping.  Having going 5-6 times a day, up to 8 times per day although some trips to the bathroom he was not having any output other than maybe some gas and other times having some watery diarrhea.  He reports the water on his trip having a terrible sulfur smell and  did admit to some tick bites and other mosquito bites as well.  Had not been taking Imodium since he was taking the cholestyramine  but in the past he would take half a dose and has never made him constipated.  Reflux controlled. Advised to stop cholestyramine  and start budesonide  93mg  daily for 3 weeks then reduce to 6mg  daily. Imodium as needed and advised to continue omeprazole  20 mg once daily. Advised to limit dairy intake and apply otc hemorrhoid cream as needed and apply zinc paste to  perineal area. Also advised daily probiotic.    Today:  Discussed the use of AI scribe software for clinical note transcription with the patient, who gave verbal consent to proceed.  He has experienced significant improvement in diarrhea since restarting budesonide . Previously, he had diarrhea up to five to six times a day, sometimes eight. He reported that he had some issues before his vacation, but his symptoms progressively worsened after returning from a trip where he encountered water quality issues and sand fleas. He was initially diagnosed with lymphocytic colitis in February 2024 via colonoscopy and treated with an eight-week course of budesonide . Before the vacation, cholestyramine  provided good improvement, but it was ineffective afterward, prompting the restart of budesonide .  He tapered off budesonide  from three pills to two pills then to one due to constipation, and since stopping, his bowel movements have improved. He occasionally experiences issues with spicy foods but reports no leakage or raw feeling during wiping. He manages some itching with Desitin.  He has a history of acid reflux, which flared up after consuming spicy chicken wings. He typically manages this with omeprazole , taken every other day, but had not taken it prior to the flare-up. He also uses Equate Pepto for relief during these episodes.  He notes a lapse in gym attendance over the past four to five months, which he plans to resume, as he previously felt better when exercising regularly. No current belly pain. He reports that his bowel movements have been a whole lot better since taking the budesonide  and have been more normal since stopping. Denies N/V, dysphagia.      Wt Readings from Last 5 Encounters:  11/05/23 248 lb 12.8 oz (112.9 kg)  09/17/23 249 lb 9.6 oz (113.2 kg)  09/07/23 241 lb 9.6 oz (109.6 kg)  09/03/23 241 lb 9.6 oz (109.6 kg)  07/16/23 245 lb (111.1 kg)    Current Outpatient Medications   Medication Sig Dispense Refill   albuterol  (PROVENTIL  HFA;VENTOLIN  HFA) 108 (90 Base) MCG/ACT inhaler Inhale 1-2 puffs into the lungs every 6 (six) hours as needed for wheezing or shortness of breath.     allopurinol (ZYLOPRIM) 300 MG tablet Take 100 mg by mouth daily.     ALPRAZolam  (XANAX ) 1 MG tablet Take 1 mg by mouth 3 (three) times daily as needed for anxiety or sleep.      atorvastatin  (LIPITOR ) 80 MG tablet TAKE 1 TABLET BY MOUTH DAILY AT 6PM 90 tablet 2   budesonide  (ENTOCORT EC ) 3 MG 24 hr capsule Take 3 capsules (9 mg total) by mouth daily for 21 days, THEN 2 capsules (6 mg total) daily. 81 capsule 1   Cholecalciferol (VITAMIN D-3) 125 MCG (5000 UT) TABS Take 5,000 Units by mouth in the morning and at bedtime.     clobetasol (TEMOVATE) 0.05 % external solution Apply 1 Application topically daily as needed (psoriasis).     fluticasone  (FLONASE ) 50 MCG/ACT nasal spray Place 2 sprays into both nostrils daily.  16 g 10   fluticasone  (FLONASE ) 50 MCG/ACT nasal spray Place 2 sprays into both nostrils daily. 16 g 10   lisinopril  (ZESTRIL ) 10 MG tablet Take 1 tablet (10 mg total) by mouth daily. 90 tablet 1   loperamide (IMODIUM) 2 MG capsule Take 1 mg by mouth as needed for diarrhea or loose stools.     MELATONIN PO Take 1 tablet by mouth at bedtime.     metoprolol  succinate (TOPROL -XL) 100 MG 24 hr tablet TAKE ONE TABLET BY MOUTH ONCE DAILY IN THE EVENING TAKE WITH OR IMMEDIATELY FOLLOWING A MEAL 30 tablet 0   Omega-3 Fatty Acids (SALMON OIL-1000 PO) Take 1,000 mg by mouth in the morning and at bedtime.     omeprazole  (PRILOSEC) 20 MG capsule TAKE 1 CAPSULE (20 MG TOTAL) BY MOUTH DAILY. 30 MINUTES BEFORE BREAKFAST. FOR REFLUX. 90 capsule 3   sotalol  (BETAPACE ) 120 MG tablet Take 1 tablet (120 mg total) by mouth 2 (two) times daily. 180 tablet 3   tamsulosin  (FLOMAX ) 0.4 MG CAPS capsule Take 0.4 mg by mouth daily.     XARELTO  20 MG TABS tablet TAKE 1 TABLET (20 MG TOTAL) BY MOUTH DAILY WITH  SUPPER. 90 tablet 1   No current facility-administered medications for this visit.    Past Medical History:  Diagnosis Date   Anxiety    Asthma    Coronary artery disease    a. 06/2016: NSTEMI s/p DES to RCA   Diverticulosis    Dressler's syndrome (HCC)    Dysrhythmia    Ectopic atrial rhythm    HLD (hyperlipidemia)    HTN (hypertension)    Myocardial infarction (HCC)    2017   PAF (paroxysmal atrial fibrillation) (HCC)    a. 06/2016: Dx'd and started on Sotolol and Xarelto     Past Surgical History:  Procedure Laterality Date   CHONDROPLASTY Right 05/10/2012   Procedure: CHONDROPLASTY;  Surgeon: Taft FORBES Minerva, MD;  Location: [facility];  Service: Orthopedics;  Laterality: Right;  of patella   COLONOSCOPY     COLONOSCOPY WITH PROPOFOL  N/A 04/17/2022   Procedure: COLONOSCOPY WITH PROPOFOL ;  Surgeon: Cindie Carlin POUR, DO;  Location: [facility];  Service: Endoscopy;  Laterality: N/A;  730am, asa 3   CORONARY STENT INTERVENTION N/A 07/06/2016   Procedure: Coronary Stent Intervention;  Surgeon: Debby DELENA Sor, MD;  Location: [facility];  Service: Cardiovascular;  Laterality: N/A;   ESOPHAGOGASTRODUODENOSCOPY     EXAM UNDER ANESTHESIA WITH MANIPULATION OF KNEE Right 05/10/2012   Procedure: EXAM UNDER ANESTHESIA WITH MANIPULATION OF KNEE;  Surgeon: Taft FORBES Minerva, MD;  Location: [facility];  Service: Orthopedics;  Laterality: Right;  start 0750 end 751   KNEE ARTHROSCOPY WITH EXCISION PLICA Right 05/10/2012   Procedure: KNEE ARTHROSCOPY WITH EXCISION PLICA;  Surgeon: Taft FORBES Minerva, MD;  Location: [facility];  Service: Orthopedics;  Laterality: Right;   KNEE ARTHROSCOPY WITH MEDIAL MENISECTOMY Right 05/10/2012   Procedure: KNEE ARTHROSCOPY WITH MEDIAL MENISECTOMY;  Surgeon: Taft FORBES Minerva, MD;  Location: [facility];  Service: Orthopedics;  Laterality: Right;   LEFT HEART CATH AND CORONARY ANGIOGRAPHY N/A 07/06/2016   Procedure: Left Heart Cath and Coronary Angiography;  Surgeon:  Debby DELENA Sor, MD;  Location[facility] INVASIVE CV LAB;  Service: Cardiovascular;  Laterality: N/A;   POLYPECTOMY  04/17/2022   Procedure: POLYPECTOMY;  Surgeon: Cindie Carlin POUR, DO;  Location: [facility];  Service: Endoscopy;;    Family History  Problem Relation Age  of Onset   Atrial fibrillation Mother    COPD Father    Mesothelioma Father    Diabetes Father    Cholecystitis Sister     Allergies as of 11/05/2023   (No Known Allergies)    Social History   Socioeconomic History   Marital status: Divorced    Spouse name: Not on file   Number of children: Not on file   Years of education: Not on file   Highest education level: Not on file  Occupational History   Not on file  Tobacco Use   Smoking status: Never   Smokeless tobacco: Never  Vaping Use   Vaping status: Never Used  Substance and Sexual Activity   Alcohol use: Yes    Comment: every other day use of brandy or beer   Drug use: No   Sexual activity: Not on file  Other Topics Concern   Not on file  Social History Narrative   Not on file   Social Drivers of Health   Financial Resource Strain: Not on file  Food Insecurity: Not on file  Transportation Needs: Not on file  Physical Activity: Not on file  Stress: Not on file  Social Connections: Not on file     Review of Systems   Gen: Denies fever, chills, anorexia. Denies fatigue, weakness, weight loss.  CV: Denies chest pain, palpitations, syncope, peripheral edema, and claudication. Resp: Denies dyspnea at rest, cough, wheezing, coughing up blood, and pleurisy. GI: See HPI Derm: Denies rash, itching, dry skin Psych: Denies depression, anxiety, memory loss, confusion. No homicidal or suicidal ideation.  Heme: Denies bruising, bleeding, and enlarged lymph nodes.  Physical Exam   BP 136/85 (BP Location: Right Arm, Patient Position: Sitting, Cuff Size: Large)   Pulse 72   Temp 97.7 F (36.5 C) (Temporal)   Ht 6' 1 (1.854 m)   Wt 248 lb 12.8 oz  (112.9 kg)   BMI 32.83 kg/m   General:   Alert and oriented. No distress noted. Pleasant and cooperative.  Head:  Normocephalic and atraumatic. Eyes:  Conjuctiva clear without scleral icterus. Mouth:  Oral mucosa pink and moist. Good dentition. No lesions.  Abdomen:  non-distended Rectal: deferred Msk:  Symmetrical without gross deformities. Normal posture. Neurologic:  Alert and  oriented x4 Psych:  Alert and cooperative. Normal mood and affect.  Assessment  Chad Ellis is a 63 y.o. male presenting today with no significant complaints.      Lymphocytic colitis with recurrent diarrhea Lymphocytic colitis diagnosed in February 2024 via colonoscopy. Previously managed with cholestyramine  and budesonide . Recent exacerbation after vacation, likely triggered by environmental factors. Budesonide  was restarted and symptoms improved. Currently, bowel movements are significantly better, with occasional issues related to dietary choices. - Resume cholestyramine  or loperamide as needed for diarrhea. - Consider colonoscopy if symptoms recur and/or develops alarm symptoms - Continue probiotic use - gave otc options.   Gastroesophageal reflux disease (GERD) Intermittent GERD symptoms, exacerbated by dietary choices such as spicy foods. Recent episode of acid reflux after consuming spicy chicken wings. Omeprazole  is taken inconsistently, which may contribute to symptoms. Discussed home remedies like vinegar or baking soda for acute relief, though consistent use of omeprazole  is advised. - Continue omeprazole  20 mg daily or every other day.  - Advised consistent use of omeprazole , especially before meals that may trigger symptoms. - Consider home remedies like vinegar or baking soda for acute relief, if preferred. - Tums or pepcid for short term relief as needed.  Perianal irritation and pruritus Perianal itching and irritation, possibly due to moisture retention or fungal infection. Symptoms  improve with Desitin, but application technique may need adjustment. Discussed potential fungal involvement and importance of keeping the area dry. Advised against using wipes to remove Desitin to prevent skin irritation. - Use over-the-counter hemorrhoid cream or antifungal cream if needed. - Ensure area is dry before applying powders or creams. - Avoid using wipes to remove Desitin; use soap and water instead.      PLAN    Follow up 6 months.     Charmaine Melia, MSN, FNP-BC, AGACNP-BC [facility]

## 2023-11-05 ENCOUNTER — Encounter: Payer: Self-pay | Admitting: Gastroenterology

## 2023-11-05 ENCOUNTER — Ambulatory Visit: Admitting: Gastroenterology

## 2023-11-05 VITALS — BP 136/85 | HR 72 | Temp 97.7°F | Ht 73.0 in | Wt 248.8 lb

## 2023-11-05 DIAGNOSIS — K219 Gastro-esophageal reflux disease without esophagitis: Secondary | ICD-10-CM | POA: Diagnosis not present

## 2023-11-05 DIAGNOSIS — K52832 Lymphocytic colitis: Secondary | ICD-10-CM

## 2023-11-05 DIAGNOSIS — L290 Pruritus ani: Secondary | ICD-10-CM

## 2023-11-05 DIAGNOSIS — K580 Irritable bowel syndrome with diarrhea: Secondary | ICD-10-CM

## 2023-11-05 NOTE — Patient Instructions (Addendum)
 VISIT SUMMARY: You came in today for a follow-up on your diarrhea, which has significantly improved since restarting budesonide . We also discussed your acid reflux and perianal itching. You mentioned that your bowel movements have been much better since stopping budesonide , and you plan to resume your gym routine soon.  YOUR PLAN: -LYMPHOCYTIC COLITIS WITH RECURRENT DIARRHEA: Lymphocytic colitis is a condition that causes inflammation in the colon, leading to diarrhea. You should resume taking cholestyramine  or loperamide as needed for diarrhea if it returns, continue using probiotics, and consider a colonoscopy if symptoms recur frequently.   OTC probiotic choices:  [place] colon health Align Culturelle Florastor Restora Visbiome You could also consider the pre and probiotic combination you mentioned - I will look into this a little further myself.   -GASTROESOPHAGEAL REFLUX DISEASE (GERD): GERD is a condition where stomach acid frequently flows back into the tube connecting your mouth and stomach, causing heartburn. Your symptoms flared up after eating spicy chicken wings. You should continue taking omeprazole  as prescribed, especially before meals that may trigger symptoms. You can also consider home remedies like vinegar or baking soda for quick relief.  -PERIANAL IRRITATION AND PRURITUS: Perianal irritation and itching can be caused by moisture or a fungal infection. Your symptoms improve with Desitin, but you should ensure the area is dry before applying any creams or powders. Avoid using wipes to remove Desitin; instead, use soap and water. You can also use over-the-counter hemorrhoid or antifungal creams if needed.  INSTRUCTIONS: Please follow up if your symptoms recur frequently or if you have any new concerns, otherwise follow up in 6 months. Consider scheduling a colonoscopy if your diarrhea becomes significant. Continue with your current medications and lifestyle adjustments as  discussed.  Contains text generated by [employer].   It was a pleasure to see you today. I want to create trusting relationships with patients. If you receive a survey regarding your visit,  I greatly appreciate you taking time to fill this out on paper or through your MyChart. I value your feedback.  Charmaine Melia, MSN, FNP-BC, AGACNP-BC [facility]

## 2024-05-09 ENCOUNTER — Ambulatory Visit (INDEPENDENT_AMBULATORY_CARE_PROVIDER_SITE_OTHER): Admitting: Otolaryngology
